# Patient Record
Sex: Female | Born: 1935 | Race: White | Hispanic: No | Marital: Married | Smoking: Former smoker
Health system: Southern US, Community
[De-identification: ages and names within clinical notes are randomized; demographics above are authoritative.]

## PROBLEM LIST (undated history)

## (undated) DIAGNOSIS — E785 Hyperlipidemia, unspecified: Secondary | ICD-10-CM

## (undated) DIAGNOSIS — E039 Hypothyroidism, unspecified: Secondary | ICD-10-CM

## (undated) DIAGNOSIS — K219 Gastro-esophageal reflux disease without esophagitis: Secondary | ICD-10-CM

## (undated) DIAGNOSIS — M1990 Unspecified osteoarthritis, unspecified site: Secondary | ICD-10-CM

## (undated) DIAGNOSIS — G4730 Sleep apnea, unspecified: Secondary | ICD-10-CM

## (undated) DIAGNOSIS — F32A Depression, unspecified: Secondary | ICD-10-CM

## (undated) DIAGNOSIS — F329 Major depressive disorder, single episode, unspecified: Secondary | ICD-10-CM

## (undated) DIAGNOSIS — I10 Essential (primary) hypertension: Secondary | ICD-10-CM

## (undated) DIAGNOSIS — R011 Cardiac murmur, unspecified: Secondary | ICD-10-CM

## (undated) DIAGNOSIS — Z8719 Personal history of other diseases of the digestive system: Secondary | ICD-10-CM

## (undated) HISTORY — DX: Hyperlipidemia, unspecified: E78.5

## (undated) HISTORY — PX: CARDIAC CATHETERIZATION: SHX172

## (undated) HISTORY — PX: CHOLECYSTECTOMY: SHX55

## (undated) HISTORY — DX: Sleep apnea, unspecified: G47.30

## (undated) HISTORY — PX: OTHER SURGICAL HISTORY: SHX169

## (undated) HISTORY — PX: ABDOMINAL HYSTERECTOMY: SHX81

## (undated) HISTORY — DX: Essential (primary) hypertension: I10

## (undated) HISTORY — PX: JOINT REPLACEMENT: SHX530

---

## 1997-12-20 ENCOUNTER — Ambulatory Visit (HOSPITAL_COMMUNITY): Admission: RE | Admit: 1997-12-20 | Discharge: 1997-12-20 | Payer: Self-pay | Admitting: *Deleted

## 1998-04-07 ENCOUNTER — Encounter: Admission: RE | Admit: 1998-04-07 | Discharge: 1998-07-06 | Payer: Self-pay | Admitting: Neurology

## 1998-07-20 ENCOUNTER — Ambulatory Visit (HOSPITAL_COMMUNITY): Admission: RE | Admit: 1998-07-20 | Discharge: 1998-07-20 | Payer: Self-pay | Admitting: Internal Medicine

## 1998-07-20 ENCOUNTER — Encounter: Payer: Self-pay | Admitting: Internal Medicine

## 1998-10-26 ENCOUNTER — Other Ambulatory Visit: Admission: RE | Admit: 1998-10-26 | Discharge: 1998-10-26 | Payer: Self-pay | Admitting: *Deleted

## 1999-02-28 ENCOUNTER — Ambulatory Visit (HOSPITAL_COMMUNITY): Admission: RE | Admit: 1999-02-28 | Discharge: 1999-02-28 | Payer: Self-pay | Admitting: *Deleted

## 1999-03-03 ENCOUNTER — Ambulatory Visit (HOSPITAL_COMMUNITY): Admission: RE | Admit: 1999-03-03 | Discharge: 1999-03-03 | Payer: Self-pay | Admitting: *Deleted

## 1999-05-31 ENCOUNTER — Encounter: Admission: RE | Admit: 1999-05-31 | Discharge: 1999-08-29 | Payer: Self-pay | Admitting: *Deleted

## 2000-01-11 ENCOUNTER — Other Ambulatory Visit: Admission: RE | Admit: 2000-01-11 | Discharge: 2000-01-11 | Payer: Self-pay | Admitting: *Deleted

## 2000-01-11 ENCOUNTER — Ambulatory Visit (HOSPITAL_COMMUNITY): Admission: RE | Admit: 2000-01-11 | Discharge: 2000-01-11 | Payer: Self-pay | Admitting: *Deleted

## 2001-06-04 ENCOUNTER — Encounter: Admission: RE | Admit: 2001-06-04 | Discharge: 2001-06-04 | Payer: Self-pay | Admitting: Internal Medicine

## 2001-06-04 ENCOUNTER — Encounter: Payer: Self-pay | Admitting: Internal Medicine

## 2001-10-10 ENCOUNTER — Encounter: Payer: Self-pay | Admitting: Internal Medicine

## 2001-10-10 ENCOUNTER — Ambulatory Visit (HOSPITAL_COMMUNITY): Admission: RE | Admit: 2001-10-10 | Discharge: 2001-10-10 | Payer: Self-pay | Admitting: Internal Medicine

## 2001-12-15 ENCOUNTER — Other Ambulatory Visit (HOSPITAL_COMMUNITY): Admission: RE | Admit: 2001-12-15 | Discharge: 2001-12-22 | Payer: Self-pay | Admitting: *Deleted

## 2002-02-19 ENCOUNTER — Encounter (INDEPENDENT_AMBULATORY_CARE_PROVIDER_SITE_OTHER): Payer: Self-pay | Admitting: *Deleted

## 2002-02-19 ENCOUNTER — Ambulatory Visit (HOSPITAL_COMMUNITY): Admission: RE | Admit: 2002-02-19 | Discharge: 2002-02-19 | Payer: Self-pay | Admitting: Gastroenterology

## 2003-06-15 ENCOUNTER — Encounter: Admission: RE | Admit: 2003-06-15 | Discharge: 2003-06-15 | Payer: Self-pay | Admitting: Internal Medicine

## 2003-06-16 ENCOUNTER — Encounter
Admission: RE | Admit: 2003-06-16 | Discharge: 2003-06-16 | Payer: Self-pay | Admitting: Physical Medicine and Rehabilitation

## 2004-03-21 ENCOUNTER — Ambulatory Visit (HOSPITAL_COMMUNITY): Admission: RE | Admit: 2004-03-21 | Discharge: 2004-03-21 | Payer: Self-pay | Admitting: Internal Medicine

## 2004-06-15 ENCOUNTER — Encounter: Admission: RE | Admit: 2004-06-15 | Discharge: 2004-06-15 | Payer: Self-pay | Admitting: Internal Medicine

## 2007-01-18 ENCOUNTER — Encounter: Admission: RE | Admit: 2007-01-18 | Discharge: 2007-01-18 | Payer: Self-pay | Admitting: Internal Medicine

## 2007-06-05 ENCOUNTER — Ambulatory Visit (HOSPITAL_COMMUNITY): Admission: RE | Admit: 2007-06-05 | Discharge: 2007-06-05 | Payer: Self-pay | Admitting: Internal Medicine

## 2007-07-23 ENCOUNTER — Ambulatory Visit (HOSPITAL_COMMUNITY): Admission: RE | Admit: 2007-07-23 | Discharge: 2007-07-23 | Payer: Self-pay | Admitting: Internal Medicine

## 2008-01-15 ENCOUNTER — Emergency Department (HOSPITAL_COMMUNITY): Admission: EM | Admit: 2008-01-15 | Discharge: 2008-01-15 | Payer: Self-pay | Admitting: Emergency Medicine

## 2008-01-27 ENCOUNTER — Ambulatory Visit (HOSPITAL_COMMUNITY): Admission: RE | Admit: 2008-01-27 | Discharge: 2008-01-27 | Payer: Self-pay | Admitting: Orthopedic Surgery

## 2008-03-26 ENCOUNTER — Inpatient Hospital Stay (HOSPITAL_COMMUNITY): Admission: RE | Admit: 2008-03-26 | Discharge: 2008-03-30 | Payer: Self-pay | Admitting: Orthopedic Surgery

## 2008-08-26 ENCOUNTER — Emergency Department (HOSPITAL_COMMUNITY): Admission: EM | Admit: 2008-08-26 | Discharge: 2008-08-26 | Payer: Self-pay | Admitting: Emergency Medicine

## 2009-02-10 ENCOUNTER — Inpatient Hospital Stay (HOSPITAL_COMMUNITY): Admission: RE | Admit: 2009-02-10 | Discharge: 2009-02-11 | Payer: Self-pay | Admitting: Orthopedic Surgery

## 2009-10-16 ENCOUNTER — Emergency Department (HOSPITAL_COMMUNITY)
Admission: EM | Admit: 2009-10-16 | Discharge: 2009-10-16 | Payer: Self-pay | Source: Home / Self Care | Admitting: Emergency Medicine

## 2010-03-07 ENCOUNTER — Encounter: Admission: RE | Admit: 2010-03-07 | Discharge: 2010-03-07 | Payer: Self-pay | Admitting: Internal Medicine

## 2010-08-27 ENCOUNTER — Encounter: Payer: Self-pay | Admitting: Orthopedic Surgery

## 2010-10-30 LAB — DIFFERENTIAL
Basophils Absolute: 0.1 10*3/uL (ref 0.0–0.1)
Basophils Relative: 1 % (ref 0–1)
Eosinophils Absolute: 0.2 10*3/uL (ref 0.0–0.7)
Eosinophils Relative: 3 % (ref 0–5)
Lymphocytes Relative: 36 % (ref 12–46)
Lymphs Abs: 2.5 10*3/uL (ref 0.7–4.0)
Monocytes Absolute: 0.4 10*3/uL (ref 0.1–1.0)
Monocytes Relative: 5 % (ref 3–12)
Neutro Abs: 3.8 10*3/uL (ref 1.7–7.7)
Neutrophils Relative %: 55 % (ref 43–77)

## 2010-10-30 LAB — BASIC METABOLIC PANEL
BUN: 16 mg/dL (ref 6–23)
CO2: 28 mEq/L (ref 19–32)
Calcium: 9.5 mg/dL (ref 8.4–10.5)
Chloride: 104 mEq/L (ref 96–112)
Creatinine, Ser: 0.85 mg/dL (ref 0.4–1.2)
GFR calc Af Amer: >60 mL/min (ref >60)
GFR calc non Af Amer: >60 mL/min (ref >60)
Glucose, Bld: 116 mg/dL — ABNORMAL HIGH (ref 70–99)
Potassium: 3.9 mEq/L (ref 3.5–5.1)
Sodium: 139 mEq/L (ref 135–145)

## 2010-10-30 LAB — SEDIMENTATION RATE: Sed Rate: 4 mm/hr (ref 0–22)

## 2010-10-30 LAB — CBC
HCT: 35.9 % — ABNORMAL LOW (ref 36.0–46.0)
Hemoglobin: 12.7 g/dL (ref 12.0–15.0)
MCHC: 35.3 g/dL (ref 30.0–36.0)
MCV: 95.7 fL (ref 78.0–100.0)
Platelets: 208 10*3/uL (ref 150–400)
RBC: 3.75 MIL/uL — ABNORMAL LOW (ref 3.87–5.11)
RDW: 13.8 % (ref 11.5–15.5)
WBC: 6.9 10*3/uL (ref 4.0–10.5)

## 2010-11-12 LAB — GLUCOSE, CAPILLARY
Glucose-Capillary: 105 mg/dL — ABNORMAL HIGH (ref 70–99)
Glucose-Capillary: 86 mg/dL (ref 70–99)

## 2010-11-12 LAB — BASIC METABOLIC PANEL
BUN: 18 mg/dL (ref 6–23)
CO2: 28 mEq/L (ref 19–32)
Calcium: 9.8 mg/dL (ref 8.4–10.5)
Chloride: 106 mEq/L (ref 96–112)
Creatinine, Ser: 0.68 mg/dL (ref 0.4–1.2)
GFR calc Af Amer: >60 mL/min (ref >60)
GFR calc non Af Amer: >60 mL/min (ref >60)
Glucose, Bld: 104 mg/dL — ABNORMAL HIGH (ref 70–99)
Potassium: 3.9 mEq/L (ref 3.5–5.1)
Sodium: 144 mEq/L (ref 135–145)

## 2010-11-12 LAB — HEMOGLOBIN AND HEMATOCRIT, BLOOD
HCT: 39 % (ref 36.0–46.0)
Hemoglobin: 13.4 g/dL (ref 12.0–15.0)

## 2010-12-19 NOTE — Discharge Summary (Signed)
[name], [name]             ACCOUNT NO.:  [id number]   MEDICAL RECORD NO.:  [id number]          PATIENT TYPE:  INP   LOCATION:  1613                         FACILITY:  [facility]   PHYSICIAN:  Marlowe Kays, M.D.  DATE OF BIRTH:  1936/01/28   DATE OF ADMISSION:  03/26/2008  DATE OF DISCHARGE:  03/30/2008                               DISCHARGE SUMMARY   ADMITTING DIAGNOSES:  1. Osteoarthritis of the left hip.  2. Healing fracture left foot.  3. Hypertension.  4. Reflux.  5. Hypothyroidism.  6. Recurrent urinary tract infections.  7. Chronic anemia.   DISCHARGE DIAGNOSIS:  1. Osteoarthritis of the left hip.  2. Healing fracture left foot.  3. Hypertension.  4. Reflux.  5. Hypothyroidism.  6. Recurrent urinary tract infections.  7. Chronic anemia.  8. Postoperative anemia treated with transfusion.   OPERATION:  On Dec 25, 2007 the patient underwent Osteonics total hip  replacement arthroplasty left hip, Dr. Durene Romans assisted.   BRIEF HISTORY:  This 75 year old white female with progressive problems  concerning her left hip now which has been going on for some time.  She  is found to be extremely uncomfortable having more and more difficulty  getting about particularly with coming to a standing position and using  stairs.  She has essentially chronic pain into the left hip.  Dr.  Simonne Come injected the hip under fluoro with anesthetic, and this  relieved the pain totally.  Our feeling is that indeed she did have some  pathology severe enough to go ahead with surgery.  X-rays also showed  degeneration of the joint.   COURSE IN THE HOSPITAL:  The patient tolerated the surgical procedure  quite well.  She began working immediately with physical therapy,  ambulating the hall, maintaining partial weightbearing on the operative  extremity.  She essentially was touchdown weightbearing.  She was able  to ambulate 25+ feet in the hallway and had compliance with her  touchdown weightbearing.  The wound remained clean and dry  postoperatively.  She did have some tape blisters from the Hypafix that  was given and was used on her postop dressing.  Other than that, she did  quite well.  She had a drop in her hemoglobin postoperatively that  required transfusion.  Hemoglobin dropped to 7.1, with hematocrit of  20.7.  She was transfused 2 units to bring her hemoglobin up to 11.5,  hematocrit 34.  Her hemoglobin at the time of discharge 9.3, hematocrit  27.2.  Once all arrangements were made, including home health, it was  felt she could be maintained in her home environment and arrangements  were made for discharge.  Laboratory values other than the above showed  blood chemistries to be normal.  Urinalysis was negative as well.  Her  preoperative hemoglobin was 11.9, hematocrit 33.5.  Cardiac clearance in  the chart, with electrocardiogram, et Karie Soda, essentially normal.   CONDITION ON DISCHARGE:  Improved and stable.   PLAN:  The patient discharged to her home.  Follow up with her medical  doctor as indicated.   DISCHARGE MEDICATIONS:  1. Norco  1 every 6 hours p.r.n.  2. Diltiazem XR 180 mg q. a.m.  3. Levothyroxine 80 mcg q. a.m.  4. Quinapril 40 mg q. a.m.  5. Effexor 75 mg 2 q. a.m.  6. Lexapro 10 mg q. a.m.  7. Crestor 10 mg q. a.m.  8. Prilosec 20 mg p.m.  9. Fish oil p.r.n.  10.We have written her a prescription for OxyIR 5 mg for pain.  11.Robaxin 500 mg for muscle spasms.  12.She will be on Coumadin protocol for 4 weeks after the date of      surgery and the pharmacist at [facility] will help dose that for [place].   Return to see Dr. Simonne Come in 2 weeks after the date of surgery.   CONDITION ON DISCHARGE:  Improved, stable.      Dooley L. Cherlynn June.    ______________________________  Marlowe Kays, M.D.    DLU/MEDQ  D:  03/30/2008  T:  03/30/2008  Job:  [id number]   cc:   Lovenia Kim, D.O.  Fax: [phone]   Marlowe Kays, M.D.  Fax: [phone]

## 2010-12-19 NOTE — H&P (Signed)
[name], [name]             ACCOUNT NO.:  [id number]   MEDICAL RECORD NO.:  [id number]          PATIENT TYPE:  OUT   LOCATION:  XRAY                         FACILITY:  [facility]   PHYSICIAN:  Marlowe Kays, M.D.  DATE OF BIRTH:  [birth year]   DATE OF ADMISSION:  03/26/2008  DATE OF DISCHARGE:                              HISTORY & PHYSICAL   CHIEF COMPLAINT:  Pain in my left hip.   HISTORY OF PRESENT ILLNESS:  This 75 year old white female has been seen  by [place] for continuing progressive problems concerning pain into her left  hip.  She has been having problems with this for some time.  She has  difficulty sleeping due to her left hip, and now has to walk with a  cane.  X-rays show near bone-on-bone deformity of left hip.  We tried an  intra-articular injection to the left hip under fluoro and local  anesthesia.  This completely relieved her hip pain for about 2 hours,  but it did return.  This was done primarily in lieu of the fact that she  has spinal stenosis at L4-5, being treated by Dr. Ethelene Hal with analgesics  and diagnostically indeed she is having more pain from the hip than the  spine at this time.   After much discussion including the risks and benefits of surgery, we  have decided to go ahead with total hip replacement arthroplasty.   PAST MEDICAL HISTORY:  The patient has been in relatively good health  throughout her lifetime.  Medically, she had been treated by Dr. Marisue Brooklyn for hypothyroidism, chronic urinary tract infection, reflux,  hypertension.  She also has diabetes, but is controlled by diet.   CURRENT MEDICATION:  1. DILT-XR 100 mg daily.  2. Levothyroxine 200 mEq daily.  3. Quinapril 40 mg daily.  4. Effexor 150 mg daily.  5. Lexapro 10 mg daily.  6. Crestor 10 mg daily.  7. Lovaza p.r.n.  8. Prilosec p.r.n.  9. Norco 10 on p.r.n.   ALLERGIES:  IVP DYE.   FAMILY PHYSICIAN:  Dr. Marisue Brooklyn.   PAST SURGERIES:  1. A hysterectomy in June  1995.  2. Benign breast tumors removed in 1975.  3. Cholecystectomy in 1995.   FAMILY HISTORY:  Positive for heart disease in the father and mother,  hypertension in the father and mother, diabetes in the mother as well as  breast cancer in the mother.   SOCIAL HISTORY:  The patient is married.  She is retired, works part-  time for [employer].  She has no intake of alcohol or tobacco  products.  She has two children, four stairs into her home and three  stairs out.   REVIEW OF SYSTEMS:  CNS:  No seizures or shoulder paralysis, numbness,  double vision.  RESPIRATORY:  No productive cough, no hemoptysis or shortness of breath.  CARDIOVASCULAR:  No chest pain or angina, orthopnea.  GASTROINTESTINAL:  No nausea, vomiting, melena, or bloody stool.  GENITOURINARY:  No discharge, dysuria, hematuria.  Her last urinary  tract infection was February this year.   PHYSICAL EXAMINATION:  GENERAL:  Alert and cooperative, friendly.  A 34-  year-old white female.  VITAL SIGNS:  Blood pressure 154/68, pulse 50, respirations 14.  HEENT:  Normocephalic, PERRLA.  EOM intact.  Oropharynx is clear.  CHEST:  Clear to auscultation, rhonchi or rales.  No wheezes.  HEART:  Regular rate and rhythm.  No murmurs are heard.  ABDOMEN:  Obese, soft, nontender, liver and spleen not felt.  GENITALIA/RECTAL/PELVIC/BREASTS:  Not done, not pertinent to present  illness.  EXTREMITIES:  The patient has pain with range of motion of the left hip  and some mild discomfort on the left lateral foot at a healing fracture  of the base of the fifth metatarsal.   ADMISSION DIAGNOSIS:  End-stage osteoarthritis of left hip.   PLAN:  1. Healing fracture of the left foot (base of the fifth metatarsal).  2. Hypertension.  3. Reflux.  4. Hypothyroidism.  5. Recurrent UTIs.  6. Chronic anemia.   PLAN:  The patient will undergo left total hip replacement arthroplasty.  Dr. Charlann Boxer will assist.  Plans at this point in time is  for her to have  home health after her regular hospitalization.  She may need skilled  nursing depending on her activity and hospital.      Terie Purser L. Cherlynn June.    ______________________________  Marlowe Kays, M.D.   DLU/MEDQ  D:  03/17/2008  T:  03/17/2008  Job:  [id number]   cc:   Lovenia Kim, D.O.  Fax: [phone]   Marlowe Kays, M.D.  Fax: [phone]

## 2010-12-19 NOTE — Op Note (Signed)
[name], [name]             ACCOUNT NO.:  [id number]   MEDICAL RECORD NO.:  [id number]          PATIENT TYPE:  INP   LOCATION:  0003                         FACILITY:  [facility]   PHYSICIAN:  [name], M.D.  DATE OF BIRTH:  1936/04/17   DATE OF PROCEDURE:  03/26/2008  DATE OF DISCHARGE:                               OPERATIVE REPORT   PREOPERATIVE DIAGNOSIS:  Osteoarthritis, left hip.   POSTOPERATIVE DIAGNOSIS:  Osteoarthritis, left hip.   OPERATION:  Osteonics total hip replacement, left.   SURGEON:  [name], M.D.   ASSISTANT:  [name]. [name], M.D.   ANESTHESIA:  Spinal.   INDICATIONS FOR PROCEDURE:  She has osteoarthritis of her left hip, also  has a bad back.  I have injected her left hip under fluoro and this  relieved her pain indicating the reliability of performing a total hip  replacement on the left.   PROCEDURE:  Prophylactic antibiotics, satisfactory spinal anesthesia,  Foley catheter inserted right, lateral decubitus position on the [facility] II  frame.  The left hip was prepped with DuraPrep and draped in a sterile  field.  I-beam employed.  Time-out performed.  Posterolateral incision  down to the fascia lata.  External rotators were detached from the  femur, the capsule identified and subtotal capsulectomy performed. I  was then able to dislocate the hip and with a power saw amputated the  femoral neck just below the femoral head.  The piriformis fossa was  cleared of soft tissue and the guide pin was placed down into the femur  and overdrilled with the canal finder placed.  During this procedure,  she had a little fragmentation of the posterior femoral neck cortex  which we did not feel would contribute to any lack of stability or  fixation.  We then began the vertical reaming of the femur using the  greater trochanter reamer to increase the lateral penetration and reamed  up to a #7, rasping up to the same.  Along the way, with the vertical  ramus,  I made my final femoral neck cut a fingerbreadth above the  lesser trochanter.  I felt that the #7 prosthesis gave [place] good tight  stabilization fit.  I then reamed for the tip of the prosthesis up to  11.5.  I then completed the capsulectomy of the acetabulum and removed  the soft tissue from the fossa ovale. With a 45 mm starting reamer, I  flattened out the acetabular base down to bleeding bone and then gently  expanded up to a 50.  I then went through a trial reduction with the cup  having good stability and the tip was nice and stable.  Accordingly,  we  went ahead and, after irrigating the wound well, I placed the final  acetabular shell of 50 mm which was stabilized with 2 screws, which were  individually screwed, measured and screwed. I then placed a final 36 mm  insert with a 10 mm offset at about 1 o'clock.  I went ahead and placed  the final prosthesis reducing the hip with a +5 trial neck and this  seemed to be an ideal fit with neck length tension and stability.  Accordingly, I went ahead and  placed a final 36 mL head plus 5 neck  reducing the hip and once again found it to be stable.  I irrigated the  wound well and closed it with interrupted #1-Vicryl in the external  rotator muscles, #1-Vicryl in the fascia lata and split gluteus muscle,  #1-Vicryl in the deep subcutaneous tissue, 2-0 Vicryl in the superficial  subcutaneous tissue and staples in the skin.  Betadine, Adaptic and dry  sterile dressing were applied.  She was then gently placed on her back  protecting the leg.  The leg lengths were equal and she was already  starting to  move and dorsiflex her foot in the operating room.  I then  placed her on an abduction pillow.  Then she was taken to the recovery  room in satisfactory condition with no known complications.  Estimated  blood loss was 600 mL, no blood replacement.           ______________________________  [name], M.D.     JA/MEDQ  D:   03/26/2008  T:  03/26/2008  Job:  [id number]

## 2010-12-19 NOTE — Op Note (Signed)
[name], [name]             ACCOUNT NO.:  [id number]   MEDICAL RECORD NO.:  [id number]          PATIENT TYPE:  OIB   LOCATION:  1618                         FACILITY:  [facility]   PHYSICIAN:  Marlowe Kays, M.D.  DATE OF BIRTH:  20-Aug-1935   DATE OF PROCEDURE:  DATE OF DISCHARGE:                               OPERATIVE REPORT   PREOPERATIVE DIAGNOSES:  1. Glenohumeral arthritis with labral degenerative tear.  2. Extensive rotator cuff tear.   POSTOPERATIVE DIAGNOSES:  1. Glenohumeral arthritis with labral degenerative tear.  2. Extensive rotator cuff tear.  3. Partial tear of long head biceps tendon.   OPERATION:  1. Right shoulder arthroscopy with debridement of labrum, synovectomy      of joint and also debridement of long head biceps tendon.  2. Open anterior acromionectomy.      a.     Repair of long head biceps tendon.      b.     Repair of extensive rotator cuff tear.   SURGEON:  Marlowe Kays, M.D.   ASSISTANT:  Mr. [name], [facility].   ANESTHESIA:  General.   PATHOLOGY AND JUSTIFICATION FOR PROCEDURE:  Painful right shoulder with  an MRI on November 17, 2008 demonstrating the preoperative diagnoses.  There is felt to be some mild long head of the biceps tendinosis.  See  operative description below for additional details of the surgery.   PROCEDURE:  Prophylactic antibiotics, beach-chair position on the Allen  frame, right shoulder girdle was prepped with DuraPrep and draped in a  sterile field.  A timeout performed.  The anatomy of the shoulder joint  was marked out and the coracoid process posterior portal and eventual  open incision for the rotator cuff repair were all marked out.  She also  had a preoperative interscalene block.  Through a posterior soft spot  portal, I atraumatically entered the glenohumeral joint and on  inspection found the labrum to be quite disrupted.  There was also good  bit of reactive tissue throughout the joint and the biceps tendon  was  only barely visible.  I advanced the scope between what I thought was  the biceps tendon and the subscapularis and using a switching stick made  an anterior incision.  Over the switching stick, I placed another  cannula and introduced a 4.2 shaver into the joint.  I then began  debriding down her labrum, generally debriding the anterior of the joint  and a good bit of synovitis and reactive tissue around the long head of  the biceps tendon which was noted superiorly to be quite flattened and  it did appear that there were some fissures in it.  Basically it was  intact, however.  After completing this debridement with pictures, I  then made an incision vertically from roughly the [facility] joint down to the  greater tuberosity.  The fascia over the anterior acromion was opened in  line with the skin incision with cutting cautery and the anterior  acromion identified along with the [facility] joint with a Mellody Dance needle.  She  had a very prominent anterior acromion and I  undermined it with a small  Cobb elevator followed by a larger Cobb elevator.  I then used the micro-  saw to perform my initial anterior acromionectomy preserving the [facility]  joint.  I then trimmed a little bit more underneath surface bone off the  acromion which gave [place] a nice exposure without any residual impingement.  The long head of the biceps tendon was identifiable and a big gap  created by anterior-posterior flaps of the rotator cuff with the tear  going all way back beneath the clavicle.  The biceps tendon had a number  of longitudinal fissures in it, but overall was intact.  I was able to  repair it with multiple interrupted 2-0 Vicryl sutures, which I used to  try and minimize having any nonabsorbable suture in her shoulder joint.  I then freed up the two halves of the rotator cuff.  Working laterally  first, I then was able to place multiple #1 Ethibond sutures side-to-  side getting what appeared to be a good stable repair  working well back  up underneath the acromion and clavicle until the entire tear had been  repaired side-to-side.  The repair was stable with her arm to her side.  After irrigating the wound well with sterile saline, I then repaired a  small separation of the deltoid muscle and fascia over the residual  anterior acromion with interrupted #1 Vicryl, subcutaneous tissue was  closed with 2-0 Vicryl and the skin with Steri-Strips.  Previous to the  open incision, I closed the two portals from the arthroscopy with 4-0  nylon and also applied a Betadine sticky drape prior to the arthrotomy.  Betadine and Adaptic were placed over the two portals.  A dry sterile  dressing over the entire shoulder.  She was placed in a shoulder  immobilizer and taken to the recovery room in satisfactory condition  with no known complications.           ______________________________  Marlowe Kays, M.D.     JA/MEDQ  D:  02/09/2009  T:  02/10/2009  Job:  [id number]

## 2010-12-22 NOTE — Procedures (Signed)
[facility]  Patient:    [name], [name] Visit Number: [id number] MRN: [id number]          Service Type: END Location: ENDO Attending Physician:  Orland Mustard Dictated by:   Llana Aliment. Randa Evens, M.D. Proc. Date: 02/19/02 Admit Date:  02/19/2002 Discharge Date: 02/19/2002   CC:         Lovenia Kim, D.O.   Procedure Report  DATE OF BIRTH:  [birth year].  PROCEDURE:  Colonoscopy.  MEDICATIONS:  Fentanyl 100 mcg, Versed 15 mg IV.  SCOPE:  Olympus pediatric video colonoscope.  INDICATION:  Patient with a need for colon cancer screening.  DESCRIPTION OF PROCEDURE:  The procedure had been explained to the patient and consent obtained.  With the patient in the left lateral decubitus position, the Olympus pediatric video colonoscope was inserted, advanced under direct visualization.  The prep was excellent.  Using abdominal pressure and position changes, we were able to reach the cecum.  The ileocecal valve and appendiceal orifice were seen.  The scope was withdrawn.  In the ascending colon just above the ileocecal valve, a 0.5 cm sessile polyp removed with a snare and sucked through the scope.  No other polyps seen in the transverse or descending colon.  At approximately 30 from the anal verge in the sigmoid colon a 3-4 mm sessile polyp was removed with the snare and sucked with the scope.  Both polyps were recovered and placed in separate jars.  The scope was withdrawn.  The patient tolerated the procedure well.  ASSESSMENT:  Polyps removed from the sigmoid and ascending colon.  PLAN:  Will check pathology.  Will probably need a three-year repeat.  Routine postpolypectomy instructions. Dictated by:   Llana Aliment. Randa Evens, M.D. Attending Physician:  Orland Mustard DD:  02/19/02 TD:  02/24/02 Job: [id number]

## 2011-06-19 ENCOUNTER — Telehealth: Payer: Self-pay

## 2011-06-19 NOTE — Telephone Encounter (Addendum)
ROI faxed to [facility] @ [phone]   06/19/11/km  Records Received from [facility] gave to [facility] 06/25/11/km

## 2011-07-25 ENCOUNTER — Encounter: Payer: Self-pay | Admitting: Internal Medicine

## 2011-07-26 ENCOUNTER — Ambulatory Visit: Payer: Self-pay | Admitting: Internal Medicine

## 2011-08-17 ENCOUNTER — Encounter: Payer: Self-pay | Admitting: Internal Medicine

## 2011-08-17 ENCOUNTER — Ambulatory Visit (INDEPENDENT_AMBULATORY_CARE_PROVIDER_SITE_OTHER): Payer: Medicare Other | Admitting: Internal Medicine

## 2011-08-17 VITALS — BP 170/69 | HR 56 | Ht 63.0 in | Wt 214.0 lb

## 2011-08-17 DIAGNOSIS — I10 Essential (primary) hypertension: Secondary | ICD-10-CM

## 2011-08-17 DIAGNOSIS — E785 Hyperlipidemia, unspecified: Secondary | ICD-10-CM

## 2011-08-17 DIAGNOSIS — I2510 Atherosclerotic heart disease of native coronary artery without angina pectoris: Secondary | ICD-10-CM | POA: Diagnosis not present

## 2011-08-17 MED ORDER — BUPROPION HCL ER (SR) 150 MG PO TB12: 150.0000 mg | ORAL_TABLET | Freq: Two times a day (BID) | ORAL | Status: DC

## 2011-08-17 MED ORDER — CITALOPRAM HYDROBROMIDE 40 MG PO TABS: 40.0000 mg | ORAL_TABLET | Freq: Every day | ORAL | Status: DC

## 2011-08-17 NOTE — Progress Notes (Signed)
HPI  Patient is a 76 year old with a history of CAD.  Previously followed at [facility] by Dr. Lynnea Ferrier. She has a history of mild to moderate CAD (Cath 2009 after abnormmal perfusion test.  50% mid/distal LAD; 50% L Cx), 60% R renal artery stenosis, OSA (not using), DM, HTN, Dyslpidemia.  She is also seen by Dr. Oneta Rack. I take care of her husband Laurena Valko. The patient is under marked increased stress caring for her husband who is very inactive.  Tearful today talking about it.  Denies signif CP or signif SOB> Allergies  Allergen Reactions  . Iohexol      Desc: THROAT SWELLED/ PROBLEMS BREATHING WITH IVP DYE 10 PLUS YEARS AGO     Current Outpatient Prescriptions  Medication Sig Dispense Refill  . ABILIFY 2 MG tablet 1 tab daily      . acyclovir (ZOVIRAX) 800 MG tablet As directed      . ALPRAZolam (XANAX) 0.25 MG tablet As needed      . buPROPion (WELLBUTRIN SR) 150 MG 12 hr tablet 1 tab daily      . citalopram (CELEXA) 40 MG tablet 1 tab daily      . diltiazem (CARDIZEM CD) 180 MG 24 hr capsule 1 tab daily      . HYDROcodone-acetaminophen (NORCO) 10-325 MG per tablet As needed      . levothyroxine (SYNTHROID, LEVOTHROID) 200 MCG tablet Take 200 mcg by mouth daily.      . modafinil (PROVIGIL) 200 MG tablet 1 tab daily      . omeprazole (PRILOSEC) 20 MG capsule Take 20 mg by mouth daily.      . rosuvastatin (CRESTOR) 10 MG tablet Take 10 mg by mouth daily.        Past Medical History  Diagnosis Date  . Sleep apnea   . Diabetes mellitus     diet controlled  . Hypertension   . Dyslipidemia     Past Surgical History  Procedure Date  . Breast tumor     secondary to fibrocystic disese  . Cholecystctomy   . Left hip replacement     No family history on file.  History   Social History  . Marital Status: Married    Spouse Name: N/A    Number of Children: N/A  . Years of Education: N/A   Occupational History  . Not on file.   Social History  Main Topics  . Smoking status: Former Smoker    Quit date: 08/06/1990  . Smokeless tobacco: Not on file  . Alcohol Use: Not on file  . Drug Use: Not on file  . Sexually Active: Not on file   Other Topics Concern  . Not on file   Social History Narrative   Married2 childrenRetired from [employer] , no longer works. She does not use alcohol or tobacco    Review of Systems:  All systems reviewed.  They are negative to the above problem except as previously stated.  Vital Signs: BP 170/69  Pulse 56  Ht 5\' 3"  (1.6 m)  Wt 214 lb (97.07 kg)  BMI 37.91 kg/m2  Physical Exam  Patient is in NAD.  Did not take meds this AM>  HEENT:  Normocephalic, atraumatic. EOMI, PERRLA.  Neck: JVP is normal. No thyromegaly. No bruits.  Lungs: clear to auscultation. No rales no wheezes.  Heart: Regular rate and rhythm. Normal S1, S2. No S3.   No significant murmurs. PMI not displaced.  Abdomen:  Supple, nontender. Normal bowel sounds. No masses. No hepatomegaly.  Extremities:   Good distal pulses throughout. No lower extremity edema.  Musculoskeletal :moving all extremities.  Neuro:   alert and oriented x3.  CN II-XII grossly intact.  EKG:  Sinus bradycardia  56 bpm.  Assessment and Plan:

## 2011-08-21 NOTE — Assessment & Plan Note (Signed)
I am not convinced of active angina.  She is under increased stress and I recomm that she try to get help so that she gets a break to do activities for herself. Stay active.

## 2011-08-21 NOTE — Assessment & Plan Note (Signed)
BP is high.  She did not take meds.  I have instructed her to follow.  Will get outside records.

## 2011-08-21 NOTE — Assessment & Plan Note (Signed)
Will get lipids from Dr. Michaelle Birks office.  Keep on meds.

## 2011-09-19 DIAGNOSIS — E039 Hypothyroidism, unspecified: Secondary | ICD-10-CM | POA: Diagnosis not present

## 2011-09-19 DIAGNOSIS — E559 Vitamin D deficiency, unspecified: Secondary | ICD-10-CM | POA: Diagnosis not present

## 2011-09-19 DIAGNOSIS — I10 Essential (primary) hypertension: Secondary | ICD-10-CM | POA: Diagnosis not present

## 2011-09-19 DIAGNOSIS — N390 Urinary tract infection, site not specified: Secondary | ICD-10-CM | POA: Diagnosis not present

## 2011-09-19 DIAGNOSIS — R7309 Other abnormal glucose: Secondary | ICD-10-CM | POA: Diagnosis not present

## 2011-09-19 DIAGNOSIS — E782 Mixed hyperlipidemia: Secondary | ICD-10-CM | POA: Diagnosis not present

## 2011-09-19 DIAGNOSIS — D649 Anemia, unspecified: Secondary | ICD-10-CM | POA: Diagnosis not present

## 2011-09-19 DIAGNOSIS — E538 Deficiency of other specified B group vitamins: Secondary | ICD-10-CM | POA: Diagnosis not present

## 2011-09-21 ENCOUNTER — Telehealth: Payer: Self-pay | Admitting: Internal Medicine

## 2011-09-21 NOTE — Telephone Encounter (Signed)
LOV,12,Labs faxed to [facility] @ [phone] 09/21/11/KM

## 2011-10-05 ENCOUNTER — Other Ambulatory Visit: Payer: Self-pay

## 2011-10-05 ENCOUNTER — Emergency Department (HOSPITAL_COMMUNITY)
Admission: EM | Admit: 2011-10-05 | Discharge: 2011-10-05 | Disposition: A | Discharge status: Home / Self Care | Payer: Medicare Other | Attending: Emergency Medicine | Admitting: Emergency Medicine

## 2011-10-05 ENCOUNTER — Encounter (HOSPITAL_COMMUNITY): Payer: Self-pay | Admitting: Emergency Medicine

## 2011-10-05 DIAGNOSIS — E785 Hyperlipidemia, unspecified: Secondary | ICD-10-CM | POA: Diagnosis not present

## 2011-10-05 DIAGNOSIS — M25519 Pain in unspecified shoulder: Secondary | ICD-10-CM | POA: Diagnosis not present

## 2011-10-05 DIAGNOSIS — E119 Type 2 diabetes mellitus without complications: Secondary | ICD-10-CM | POA: Insufficient documentation

## 2011-10-05 DIAGNOSIS — Z79899 Other long term (current) drug therapy: Secondary | ICD-10-CM | POA: Diagnosis not present

## 2011-10-05 DIAGNOSIS — I10 Essential (primary) hypertension: Secondary | ICD-10-CM | POA: Insufficient documentation

## 2011-10-05 DIAGNOSIS — R1110 Vomiting, unspecified: Secondary | ICD-10-CM | POA: Insufficient documentation

## 2011-10-05 MED ORDER — HYDROMORPHONE HCL PF 2 MG/ML IJ SOLN
2.0000 mg | Freq: Once | INTRAMUSCULAR | Status: AC
  Administered 2011-10-05: 2 mg via INTRAMUSCULAR
  Filled 2011-10-05: qty 1

## 2011-10-05 MED ORDER — DIPHENHYDRAMINE HCL 25 MG PO CAPS
ORAL_CAPSULE | ORAL | Status: AC
  Filled 2011-10-05: qty 1

## 2011-10-05 MED ORDER — HYDROMORPHONE HCL 2 MG PO TABS: 2.0000 mg | ORAL_TABLET | ORAL | Status: AC | PRN

## 2011-10-05 MED ORDER — ONDANSETRON 4 MG PO TBDP: 4.0000 mg | ORAL_TABLET | Freq: Four times a day (QID) | ORAL | Status: AC | PRN

## 2011-10-05 NOTE — ED Provider Notes (Signed)
History     CSN: [id number]  Arrival date & time 10/05/11  1722   First MD Initiated Contact with Patient 10/05/11 1820      Chief Complaint  Patient presents with  . Arm Pain    (Consider location/radiation/quality/duration/timing/severity/associated sxs/prior treatment) HPI Comments: Patient with a history of arthritis presents emergency Department with chief complaint of left shoulder pain.  Patient is currently being followed by [facility] orthopedics with Dr. Rennis Chris, for this shoulder.  Patient has an apt Wed March 6th.  Patient's shoulder had been treated with Cortisone injection late Jan that helped x 3 weeks. MRI done about 2 weeks ago and pt has not yet got results. MRI is not in our system. Pt was given norco for pain medication and was told by orthopedic doctor to double dose, Which caused the pt to vomit. Pt was then called in 2mg  dilaudid for pain on the 19th which she has been taking regularly and it is not relieving pain.   Patient is a 76 y.o. female presenting with shoulder pain. The history is provided by the patient.  Shoulder Pain This is a new problem. The current episode started 1 to 4 weeks ago. The problem occurs constantly. The problem has been gradually worsening. Associated symptoms include arthralgias. Pertinent negatives include no abdominal pain, anorexia, change in bowel habit, chest pain, chills, congestion, coughing, diaphoresis, fatigue, fever, headaches, joint swelling, myalgias, nausea, neck pain, numbness, rash, sore throat, swollen glands, urinary symptoms, vertigo, visual change, vomiting or weakness. Exacerbated by: movement, laying on it , pressure. She has tried oral narcotics for the symptoms. The treatment provided mild relief.    Past Medical History  Diagnosis Date  . Sleep apnea   . Diabetes mellitus     diet controlled  . Hypertension   . Dyslipidemia     Past Surgical History  Procedure Date  . Breast tumor     secondary to fibrocystic  disese  . Cholecystctomy   . Left hip replacement     No family history on file.  History  Substance Use Topics  . Smoking status: Former Smoker    Quit date: 08/06/1990  . Smokeless tobacco: Not on file  . Alcohol Use: No    OB History    Grav Para Term Preterm Abortions TAB SAB Ect Mult Living                  Review of Systems  Constitutional: Negative for fever, chills, diaphoresis and fatigue.  HENT: Negative for congestion, sore throat and neck pain.   Respiratory: Negative for cough.   Cardiovascular: Negative for chest pain.  Gastrointestinal: Negative for nausea, vomiting, abdominal pain, anorexia and change in bowel habit.  Musculoskeletal: Positive for arthralgias. Negative for myalgias and joint swelling.  Skin: Negative for rash.  Neurological: Negative for vertigo, weakness, numbness and headaches.  All other systems reviewed and are negative.    Allergies  Iohexol  Home Medications   Current Outpatient Rx  Name Route Sig Dispense Refill  . ABILIFY 2 MG PO TABS  1 tab daily    . ACYCLOVIR 800 MG PO TABS  As directed    . ALPRAZOLAM 0.25 MG PO TABS  As needed    . BUPROPION HCL ER (SR) 150 MG PO TB12 Oral Take 1 tablet (150 mg total) by mouth 2 (two) times daily. 1 tab daily 180 tablet 0  . CITALOPRAM HYDROBROMIDE 40 MG PO TABS Oral Take 1 tablet (40 mg total)  by mouth daily. 1 tab daily 90 tablet 0  . DILTIAZEM HCL ER COATED BEADS 180 MG PO CP24  1 tab daily    . HYDROCODONE-ACETAMINOPHEN 10-325 MG PO TABS  As needed    . LEVOTHYROXINE SODIUM 200 MCG PO TABS Oral Take 200 mcg by mouth daily.    Marland Kitchen MODAFINIL 200 MG PO TABS  1 tab daily    . OMEPRAZOLE 20 MG PO CPDR Oral Take 20 mg by mouth daily.    Marland Kitchen ROSUVASTATIN CALCIUM 10 MG PO TABS Oral Take 10 mg by mouth daily.      BP 195/82  Pulse 66  Temp(Src) 98.9 F (37.2 C) (Oral)  Resp 20  Ht 5\' 3"  (1.6 m)  Wt 200 lb (90.719 kg)  BMI 35.43 kg/m2  SpO2 98%  Physical Exam  Nursing note and vitals  reviewed. Constitutional: She is oriented to person, place, and time. She appears well-developed and well-nourished. No distress.  HENT:  Head: Normocephalic and atraumatic.  Eyes: Conjunctivae and EOM are normal.  Neck: Normal range of motion.  Cardiovascular: Normal rate, regular rhythm and intact distal pulses.   Pulmonary/Chest: Effort normal.  Musculoskeletal:       Left shoulder: She exhibits decreased range of motion and tenderness. She exhibits no effusion and no crepitus.       Patient has pain with range of motion and is tender to palpation of left shoulder. No crepitus. Scars from previous shoulder surgery on right side.   Neurological: She is alert and oriented to person, place, and time.  Skin: Skin is warm and dry. No rash noted. She is not diaphoretic.  Psychiatric: She has a normal mood and affect. Her behavior is normal.    ED Course  Procedures (including critical care time)  Labs Reviewed - No data to display No results found.   No diagnosis found.    MDM  Left shoulder pain, pain management  Patient's shoulder is currently being evaluated by [facility] orthopedics.  She has a followup appointment for MRI results on March 6.  Patient is here for pain management because pain has not been relieved by 2 mg oral Dilaudid.  Patient was treated in the emergency department with IM Dilaudid.  Patient's pain has been temporarily managed.  She is been advised to keep her followup appointment with her orthopedic specialist.        Jaci Carrel, PA-C 10/05/11 2017

## 2011-10-05 NOTE — ED Notes (Signed)
Pt. With left arm pain, past surgery on left shoulder and injections of cortisone.  Pt. Had MRI recently but does not know results.  Also has had nausea and vomiting from pain medication.

## 2011-10-05 NOTE — ED Provider Notes (Signed)
Medical screening examination/treatment/procedure(s) were performed by non-physician practitioner and as supervising physician I was immediately available for consultation/collaboration.   Briany Aye, MD 10/05/11 2151 

## 2011-10-05 NOTE — ED Notes (Signed)
Pt reports Dr. Rennis Chris is treating her for her pain. States she has received an MRI, but has not been able to get an appointment with them.  States pain is getting worse. Denies N/V, sob. Denies a known injury.

## 2011-10-11 DIAGNOSIS — R7309 Other abnormal glucose: Secondary | ICD-10-CM | POA: Diagnosis not present

## 2011-10-11 DIAGNOSIS — E782 Mixed hyperlipidemia: Secondary | ICD-10-CM | POA: Diagnosis not present

## 2011-10-11 DIAGNOSIS — I10 Essential (primary) hypertension: Secondary | ICD-10-CM | POA: Diagnosis not present

## 2011-10-18 DIAGNOSIS — Z1231 Encounter for screening mammogram for malignant neoplasm of breast: Secondary | ICD-10-CM | POA: Diagnosis not present

## 2011-10-18 DIAGNOSIS — Z8262 Family history of osteoporosis: Secondary | ICD-10-CM | POA: Diagnosis not present

## 2011-10-18 LAB — HM MAMMOGRAPHY: HM Mammogram: NORMAL

## 2011-10-18 LAB — HM DEXA SCAN

## 2011-10-24 DIAGNOSIS — E039 Hypothyroidism, unspecified: Secondary | ICD-10-CM | POA: Diagnosis not present

## 2011-11-02 ENCOUNTER — Encounter (HOSPITAL_COMMUNITY): Payer: Self-pay | Admitting: Pharmacy Technician

## 2011-11-08 ENCOUNTER — Encounter (HOSPITAL_COMMUNITY)
Admission: RE | Admit: 2011-11-08 | Discharge: 2011-11-08 | Disposition: A | Discharge status: Home / Self Care | Payer: Medicare Other | Source: Ambulatory Visit | Attending: Anesthesiology | Admitting: Anesthesiology

## 2011-11-08 ENCOUNTER — Encounter (HOSPITAL_COMMUNITY): Payer: Self-pay

## 2011-11-08 ENCOUNTER — Encounter (HOSPITAL_COMMUNITY)
Admission: RE | Admit: 2011-11-08 | Discharge: 2011-11-08 | Disposition: A | Discharge status: Home / Self Care | Payer: Medicare Other | Source: Ambulatory Visit | Attending: Orthopedic Surgery | Admitting: Orthopedic Surgery

## 2011-11-08 DIAGNOSIS — Z01811 Encounter for preprocedural respiratory examination: Secondary | ICD-10-CM | POA: Diagnosis not present

## 2011-11-08 DIAGNOSIS — J984 Other disorders of lung: Secondary | ICD-10-CM | POA: Diagnosis not present

## 2011-11-08 HISTORY — DX: Hypothyroidism, unspecified: E03.9

## 2011-11-08 HISTORY — DX: Gastro-esophageal reflux disease without esophagitis: K21.9

## 2011-11-08 HISTORY — DX: Depression, unspecified: F32.A

## 2011-11-08 HISTORY — DX: Cardiac murmur, unspecified: R01.1

## 2011-11-08 HISTORY — DX: Major depressive disorder, single episode, unspecified: F32.9

## 2011-11-08 HISTORY — DX: Personal history of other diseases of the digestive system: Z87.19

## 2011-11-08 HISTORY — DX: Unspecified osteoarthritis, unspecified site: M19.90

## 2011-11-08 LAB — BASIC METABOLIC PANEL
CO2: 28 mEq/L (ref 19–32)
Chloride: 102 mEq/L (ref 96–112)
Creatinine, Ser: 0.58 mg/dL (ref 0.50–1.10)
GFR calc non Af Amer: 89 mL/min — ABNORMAL LOW (ref >90)
Potassium: 4.4 mEq/L (ref 3.5–5.1)
Sodium: 140 mEq/L (ref 135–145)

## 2011-11-08 LAB — CBC
MCH: 32.1 pg (ref 26.0–34.0)
MCHC: 34.7 g/dL (ref 30.0–36.0)
Platelets: 243 10*3/uL (ref 150–400)
RDW: 13.2 % (ref 11.5–15.5)

## 2011-11-08 LAB — SURGICAL PCR SCREEN: Staphylococcus aureus: NEGATIVE

## 2011-11-08 LAB — TYPE AND SCREEN: ABO/RH(D): A POS

## 2011-11-08 MED ORDER — CHLORHEXIDINE GLUCONATE 4 % EX LIQD: 60.0000 mL | Freq: Once | CUTANEOUS | Status: DC

## 2011-11-08 NOTE — Pre-Procedure Instructions (Signed)
20 [name]  11/08/2011   Your procedure is scheduled on:  11/15/11  Report to [facility] at           AM.  Call this number if you have problems the morning of surgery: [phone]   Remember:   Do not eat food:After Midnight.  May have clear liquids: up to 4 Hours before arrival.  Clear liquids include soda, tea, black coffee, apple or grape juice, broth.  Take these medicines the morning of surgery with A SIP OF WATER:   Amlodipine  cardizem   Hydrocodone  Synthroid        Do not wear jewelry, make-up or nail polish.  Do not wear lotions, powders, or perfumes. You may wear deodorant.  Do not shave 48 hours prior to surgery.  Do not bring valuables to the hospital.  Contacts, dentures or bridgework may not be worn into surgery.  Leave suitcase in the car. After surgery it may be brought to your room.  For patients admitted to the hospital, checkout time is 11:00 AM the day of discharge.   Patients discharged the day of surgery will not be allowed to drive home.  Name and phone number of your driver: Lajuana Ripple-  [phone]  neighbor  Special Instructions: CHG Shower Use Special Wash: 1/2 bottle night before surgery and 1/2 bottle morning of surgery.   Please read over the following fact sheets that you were given: Pain Booklet, Coughing and Deep Breathing, Blood Transfusion Information, MRSA Information and Surgical Site Infection Prevention

## 2011-11-14 MED ORDER — CEFAZOLIN SODIUM-DEXTROSE 2-3 GM-% IV SOLR
2.0000 g | INTRAVENOUS | Status: AC
  Administered 2011-11-15: 2 g via INTRAVENOUS
  Filled 2011-11-14: qty 50

## 2011-11-15 ENCOUNTER — Ambulatory Visit (HOSPITAL_COMMUNITY): Payer: Medicare Other | Admitting: Certified Registered"

## 2011-11-15 ENCOUNTER — Encounter (HOSPITAL_COMMUNITY)
Admission: RE | Disposition: A | Discharge status: Home / Self Care | Payer: Self-pay | Source: Ambulatory Visit | Attending: Orthopedic Surgery

## 2011-11-15 ENCOUNTER — Encounter (HOSPITAL_COMMUNITY): Payer: Self-pay | Admitting: *Deleted

## 2011-11-15 ENCOUNTER — Encounter (HOSPITAL_COMMUNITY): Payer: Self-pay | Admitting: Certified Registered"

## 2011-11-15 ENCOUNTER — Inpatient Hospital Stay (HOSPITAL_COMMUNITY)
Admission: RE | Admit: 2011-11-15 | Discharge: 2011-11-19 | DRG: 484 | Disposition: A | Discharge status: Home / Self Care | Payer: Medicare Other | Source: Ambulatory Visit | Attending: Orthopedic Surgery | Admitting: Orthopedic Surgery

## 2011-11-15 DIAGNOSIS — E039 Hypothyroidism, unspecified: Secondary | ICD-10-CM | POA: Diagnosis not present

## 2011-11-15 DIAGNOSIS — Z01812 Encounter for preprocedural laboratory examination: Secondary | ICD-10-CM | POA: Diagnosis not present

## 2011-11-15 DIAGNOSIS — Z96649 Presence of unspecified artificial hip joint: Secondary | ICD-10-CM

## 2011-11-15 DIAGNOSIS — E119 Type 2 diabetes mellitus without complications: Secondary | ICD-10-CM | POA: Diagnosis present

## 2011-11-15 DIAGNOSIS — G8918 Other acute postprocedural pain: Secondary | ICD-10-CM | POA: Diagnosis not present

## 2011-11-15 DIAGNOSIS — M19019 Primary osteoarthritis, unspecified shoulder: Secondary | ICD-10-CM | POA: Diagnosis not present

## 2011-11-15 DIAGNOSIS — E785 Hyperlipidemia, unspecified: Secondary | ICD-10-CM | POA: Diagnosis not present

## 2011-11-15 HISTORY — PX: TOTAL SHOULDER ARTHROPLASTY: SHX126

## 2011-11-15 SURGERY — ARTHROPLASTY, SHOULDER, TOTAL
Anesthesia: General | Site: Shoulder | Laterality: Left | Wound class: Clean

## 2011-11-15 MED ORDER — CITALOPRAM HYDROBROMIDE 40 MG PO TABS
40.0000 mg | ORAL_TABLET | Freq: Every day | ORAL | Status: DC
  Administered 2011-11-15 – 2011-11-18 (×4): 40 mg via ORAL
  Filled 2011-11-15 (×5): qty 1

## 2011-11-15 MED ORDER — LIDOCAINE HCL (CARDIAC) 20 MG/ML IV SOLN
INTRAVENOUS | Status: DC | PRN
  Administered 2011-11-15: 80 mg via INTRAVENOUS

## 2011-11-15 MED ORDER — METOCLOPRAMIDE HCL 10 MG PO TABS: 5.0000 mg | ORAL_TABLET | Freq: Three times a day (TID) | ORAL | Status: DC | PRN

## 2011-11-15 MED ORDER — MODAFINIL 200 MG PO TABS
200.0000 mg | ORAL_TABLET | Freq: Every day | ORAL | Status: DC
  Administered 2011-11-16: 200 mg via ORAL
  Filled 2011-11-15 (×2): qty 1

## 2011-11-15 MED ORDER — ROCURONIUM BROMIDE 100 MG/10ML IV SOLN
INTRAVENOUS | Status: DC | PRN
  Administered 2011-11-15: 50 mg via INTRAVENOUS

## 2011-11-15 MED ORDER — HYDROMORPHONE HCL PF 1 MG/ML IJ SOLN: 0.2500 mg | INTRAMUSCULAR | Status: DC | PRN

## 2011-11-15 MED ORDER — SUFENTANIL CITRATE 50 MCG/ML IV SOLN
INTRAVENOUS | Status: DC | PRN
  Administered 2011-11-15: 5 ug via INTRAVENOUS
  Administered 2011-11-15: 20 ug via INTRAVENOUS

## 2011-11-15 MED ORDER — FENTANYL CITRATE 0.05 MG/ML IJ SOLN
50.0000 ug | INTRAMUSCULAR | Status: DC | PRN
  Administered 2011-11-15: 100 ug via INTRAVENOUS

## 2011-11-15 MED ORDER — NEOSTIGMINE METHYLSULFATE 1 MG/ML IJ SOLN
INTRAMUSCULAR | Status: DC | PRN
  Administered 2011-11-15: 5 mg via INTRAVENOUS

## 2011-11-15 MED ORDER — ONDANSETRON HCL 4 MG/2ML IJ SOLN: 4.0000 mg | Freq: Four times a day (QID) | INTRAMUSCULAR | Status: DC | PRN

## 2011-11-15 MED ORDER — HYDROMORPHONE HCL PF 1 MG/ML IJ SOLN
0.5000 mg | INTRAMUSCULAR | Status: DC | PRN
  Administered 2011-11-15 – 2011-11-17 (×4): 1 mg via INTRAVENOUS
  Filled 2011-11-15 (×4): qty 1

## 2011-11-15 MED ORDER — PHENOL 1.4 % MT LIQD: 1.0000 | OROMUCOSAL | Status: DC | PRN

## 2011-11-15 MED ORDER — MENTHOL 3 MG MT LOZG: 1.0000 | LOZENGE | OROMUCOSAL | Status: DC | PRN

## 2011-11-15 MED ORDER — LACTATED RINGERS IV SOLN
INTRAVENOUS | Status: DC
  Administered 2011-11-15: 13:00:00 via INTRAVENOUS

## 2011-11-15 MED ORDER — ALPRAZOLAM 0.25 MG PO TABS
0.2500 mg | ORAL_TABLET | Freq: Every day | ORAL | Status: DC | PRN
  Administered 2011-11-16 – 2011-11-17 (×2): 0.25 mg via ORAL
  Filled 2011-11-15 (×2): qty 1

## 2011-11-15 MED ORDER — BUPROPION HCL ER (SR) 150 MG PO TB12
150.0000 mg | ORAL_TABLET | Freq: Every day | ORAL | Status: DC
  Administered 2011-11-15 – 2011-11-18 (×4): 150 mg via ORAL
  Filled 2011-11-15 (×5): qty 1

## 2011-11-15 MED ORDER — DILTIAZEM HCL ER COATED BEADS 180 MG PO CP24
180.0000 mg | ORAL_CAPSULE | Freq: Every day | ORAL | Status: DC
  Administered 2011-11-16: 180 mg via ORAL
  Filled 2011-11-15 (×5): qty 1

## 2011-11-15 MED ORDER — ONDANSETRON HCL 4 MG PO TABS: 4.0000 mg | ORAL_TABLET | Freq: Four times a day (QID) | ORAL | Status: DC | PRN

## 2011-11-15 MED ORDER — METHOCARBAMOL 100 MG/ML IJ SOLN
500.0000 mg | Freq: Four times a day (QID) | INTRAVENOUS | Status: DC | PRN
  Filled 2011-11-15: qty 5

## 2011-11-15 MED ORDER — OLMESARTAN MEDOXOMIL 40 MG PO TABS
40.0000 mg | ORAL_TABLET | Freq: Every day | ORAL | Status: DC
  Administered 2011-11-16: 40 mg via ORAL
  Filled 2011-11-15 (×4): qty 1

## 2011-11-15 MED ORDER — MIDAZOLAM HCL 2 MG/2ML IJ SOLN
1.0000 mg | INTRAMUSCULAR | Status: DC | PRN
  Administered 2011-11-15: 2 mg via INTRAVENOUS

## 2011-11-15 MED ORDER — LACTATED RINGERS IV SOLN
INTRAVENOUS | Status: DC | PRN
  Administered 2011-11-15 (×2): via INTRAVENOUS

## 2011-11-15 MED ORDER — AMLODIPINE-OLMESARTAN 5-40 MG PO TABS: 1.0000 | ORAL_TABLET | Freq: Every day | ORAL | Status: DC

## 2011-11-15 MED ORDER — EPHEDRINE SULFATE 50 MG/ML IJ SOLN
INTRAMUSCULAR | Status: DC | PRN
  Administered 2011-11-15: 10 mg via INTRAVENOUS

## 2011-11-15 MED ORDER — LIDOCAINE HCL 4 % MT SOLN
OROMUCOSAL | Status: DC | PRN
  Administered 2011-11-15: 4 mL via TOPICAL

## 2011-11-15 MED ORDER — ATORVASTATIN CALCIUM 20 MG PO TABS
20.0000 mg | ORAL_TABLET | Freq: Every day | ORAL | Status: DC
  Administered 2011-11-16 – 2011-11-18 (×3): 20 mg via ORAL
  Filled 2011-11-15 (×5): qty 1

## 2011-11-15 MED ORDER — DOCUSATE SODIUM 100 MG PO CAPS
100.0000 mg | ORAL_CAPSULE | Freq: Two times a day (BID) | ORAL | Status: DC
  Administered 2011-11-15 – 2011-11-18 (×6): 100 mg via ORAL
  Filled 2011-11-15 (×9): qty 1

## 2011-11-15 MED ORDER — DIPHENHYDRAMINE HCL 12.5 MG/5ML PO ELIX: 12.5000 mg | ORAL_SOLUTION | ORAL | Status: DC | PRN

## 2011-11-15 MED ORDER — MIDAZOLAM HCL 2 MG/2ML IJ SOLN
INTRAMUSCULAR | Status: AC
  Filled 2011-11-15: qty 2

## 2011-11-15 MED ORDER — METOCLOPRAMIDE HCL 5 MG/ML IJ SOLN: 5.0000 mg | Freq: Three times a day (TID) | INTRAMUSCULAR | Status: DC | PRN

## 2011-11-15 MED ORDER — ONDANSETRON HCL 4 MG/2ML IJ SOLN
INTRAMUSCULAR | Status: DC | PRN
  Administered 2011-11-15: 4 mg via INTRAVENOUS

## 2011-11-15 MED ORDER — ACETAMINOPHEN 650 MG RE SUPP: 650.0000 mg | Freq: Four times a day (QID) | RECTAL | Status: DC | PRN

## 2011-11-15 MED ORDER — LEVOTHYROXINE SODIUM 200 MCG PO TABS
200.0000 ug | ORAL_TABLET | Freq: Every day | ORAL | Status: DC
  Administered 2011-11-16 – 2011-11-19 (×4): 200 ug via ORAL
  Filled 2011-11-15 (×5): qty 1

## 2011-11-15 MED ORDER — ARIPIPRAZOLE 2 MG PO TABS
2.0000 mg | ORAL_TABLET | Freq: Every day | ORAL | Status: DC
  Administered 2011-11-15 – 2011-11-18 (×4): 2 mg via ORAL
  Filled 2011-11-15 (×5): qty 1

## 2011-11-15 MED ORDER — CEFAZOLIN SODIUM 1-5 GM-% IV SOLN
1.0000 g | Freq: Four times a day (QID) | INTRAVENOUS | Status: AC
  Administered 2011-11-15 – 2011-11-16 (×3): 1 g via INTRAVENOUS
  Filled 2011-11-15 (×3): qty 50

## 2011-11-15 MED ORDER — SODIUM CHLORIDE 0.9 % IR SOLN
Status: DC | PRN
  Administered 2011-11-15: 1000 mL

## 2011-11-15 MED ORDER — GLYCOPYRROLATE 0.2 MG/ML IJ SOLN
INTRAMUSCULAR | Status: DC | PRN
  Administered 2011-11-15: .8 mg via INTRAVENOUS

## 2011-11-15 MED ORDER — METHOCARBAMOL 500 MG PO TABS
500.0000 mg | ORAL_TABLET | Freq: Four times a day (QID) | ORAL | Status: DC | PRN
  Administered 2011-11-16 – 2011-11-19 (×7): 500 mg via ORAL
  Filled 2011-11-15 (×7): qty 1

## 2011-11-15 MED ORDER — OXYCODONE-ACETAMINOPHEN 5-325 MG PO TABS: 1.0000 | ORAL_TABLET | ORAL | Status: DC | PRN

## 2011-11-15 MED ORDER — FENTANYL CITRATE 0.05 MG/ML IJ SOLN
INTRAMUSCULAR | Status: AC
  Filled 2011-11-15: qty 2

## 2011-11-15 MED ORDER — MIDAZOLAM HCL 5 MG/5ML IJ SOLN
INTRAMUSCULAR | Status: DC | PRN
  Administered 2011-11-15: 2 mg via INTRAVENOUS

## 2011-11-15 MED ORDER — LACTATED RINGERS IV SOLN: INTRAVENOUS | Status: DC

## 2011-11-15 MED ORDER — ZOLPIDEM TARTRATE 5 MG PO TABS
5.0000 mg | ORAL_TABLET | Freq: Every evening | ORAL | Status: DC | PRN
  Filled 2011-11-15: qty 1

## 2011-11-15 MED ORDER — LACTATED RINGERS IV SOLN
INTRAVENOUS | Status: DC
  Administered 2011-11-16: 17:00:00 via INTRAVENOUS

## 2011-11-15 MED ORDER — PROPOFOL 10 MG/ML IV EMUL
INTRAVENOUS | Status: DC | PRN
  Administered 2011-11-15: 160 mg via INTRAVENOUS

## 2011-11-15 MED ORDER — AMLODIPINE BESYLATE 5 MG PO TABS
5.0000 mg | ORAL_TABLET | Freq: Every day | ORAL | Status: DC
  Administered 2011-11-16: 5 mg via ORAL
  Filled 2011-11-15 (×4): qty 1

## 2011-11-15 MED ORDER — PANTOPRAZOLE SODIUM 40 MG PO TBEC
40.0000 mg | DELAYED_RELEASE_TABLET | Freq: Every day | ORAL | Status: DC
  Administered 2011-11-16 – 2011-11-18 (×4): 40 mg via ORAL
  Filled 2011-11-15 (×3): qty 1

## 2011-11-15 MED ORDER — KETOROLAC TROMETHAMINE 15 MG/ML IJ SOLN
15.0000 mg | Freq: Four times a day (QID) | INTRAMUSCULAR | Status: AC
  Administered 2011-11-15 – 2011-11-17 (×8): 15 mg via INTRAVENOUS
  Filled 2011-11-15 (×12): qty 1

## 2011-11-15 MED ORDER — ACETAMINOPHEN 325 MG PO TABS: 650.0000 mg | ORAL_TABLET | Freq: Four times a day (QID) | ORAL | Status: DC | PRN

## 2011-11-15 MED ORDER — MORPHINE SULFATE 2 MG/ML IJ SOLN: 0.0500 mg/kg | INTRAMUSCULAR | Status: DC | PRN

## 2011-11-15 SURGICAL SUPPLY — 66 items
BLADE SAW SGTL 83.5X18.5 (BLADE) ×2 IMPLANT
CEMENT BONE DEPUY (Cement) ×1 IMPLANT
CLOTH BEACON ORANGE TIMEOUT ST (SAFETY) ×2 IMPLANT
CLSR STERI-STRIP ANTIMIC 1/2X4 (GAUZE/BANDAGES/DRESSINGS) ×1 IMPLANT
COVER SURGICAL LIGHT HANDLE (MISCELLANEOUS) ×2 IMPLANT
DRAPE INCISE IOBAN 66X45 STRL (DRAPES) ×2 IMPLANT
DRAPE SURG 17X11 SM STRL (DRAPES) ×2 IMPLANT
DRAPE SURG 17X23 STRL (DRAPES) ×2 IMPLANT
DRAPE U-SHAPE 47X51 STRL (DRAPES) ×2 IMPLANT
DRILL BIT 7/64X5 (BIT) IMPLANT
DRSG MEPILEX BORDER 4X8 (GAUZE/BANDAGES/DRESSINGS) ×1 IMPLANT
DURAPREP 26ML APPLICATOR (WOUND CARE) ×2 IMPLANT
ELECT BLADE 4.0 EZ CLEAN MEGAD (MISCELLANEOUS) ×2
ELECT CAUTERY BLADE 6.4 (BLADE) ×2 IMPLANT
ELECT REM PT RETURN 9FT ADLT (ELECTROSURGICAL) ×2
ELECTRODE BLDE 4.0 EZ CLN MEGD (MISCELLANEOUS) ×1 IMPLANT
ELECTRODE REM PT RTRN 9FT ADLT (ELECTROSURGICAL) ×1 IMPLANT
FACESHIELD LNG OPTICON STERILE (SAFETY) ×6 IMPLANT
GLOVE BIO SURGEON STRL SZ 6.5 (GLOVE) ×1 IMPLANT
GLOVE BIO SURGEON STRL SZ7.5 (GLOVE) ×2 IMPLANT
GLOVE BIO SURGEON STRL SZ8 (GLOVE) ×2 IMPLANT
GLOVE BIOGEL PI IND STRL 6.5 (GLOVE) IMPLANT
GLOVE BIOGEL PI IND STRL 7.5 (GLOVE) IMPLANT
GLOVE BIOGEL PI IND STRL 8 (GLOVE) IMPLANT
GLOVE BIOGEL PI INDICATOR 6.5 (GLOVE) ×2
GLOVE BIOGEL PI INDICATOR 7.5 (GLOVE) ×1
GLOVE BIOGEL PI INDICATOR 8 (GLOVE) ×1
GLOVE EUDERMIC 7 POWDERFREE (GLOVE) ×3 IMPLANT
GLOVE SS BIOGEL STRL SZ 7.5 (GLOVE) ×1 IMPLANT
GLOVE SUPERSENSE BIOGEL SZ 7.5 (GLOVE) ×1
GLOVE SURG SS PI 7.5 STRL IVOR (GLOVE) ×3 IMPLANT
GOWN STRL NON-REIN LRG LVL3 (GOWN DISPOSABLE) ×3 IMPLANT
GOWN STRL REIN XL XLG (GOWN DISPOSABLE) ×5 IMPLANT
KIT BASIN OR (CUSTOM PROCEDURE TRAY) ×2 IMPLANT
KIT ROOM TURNOVER OR (KITS) ×2 IMPLANT
MANIFOLD NEPTUNE II (INSTRUMENTS) ×2 IMPLANT
NDL HYPO 25GX1X1/2 BEV (NEEDLE) IMPLANT
NDL SUT 6 .5 CRC .975X.05 MAYO (NEEDLE) ×1 IMPLANT
NEEDLE HYPO 25GX1X1/2 BEV (NEEDLE) IMPLANT
NEEDLE MAYO TAPER (NEEDLE) ×2
NS IRRIG 1000ML POUR BTL (IV SOLUTION) ×2 IMPLANT
PACK SHOULDER (CUSTOM PROCEDURE TRAY) ×2 IMPLANT
PAD ARMBOARD 7.5X6 YLW CONV (MISCELLANEOUS) ×4 IMPLANT
PASSER SUT SWANSON 36MM LOOP (INSTRUMENTS) IMPLANT
PIN METAGLENE 2.5 (PIN) IMPLANT
SLING ARM FOAM STRAP LRG (SOFTGOODS) ×1 IMPLANT
SLING ARM IMMOBILIZER LRG (SOFTGOODS) ×1 IMPLANT
SMARTMIX MINI TOWER (MISCELLANEOUS) ×2
SPONGE LAP 18X18 X RAY DECT (DISPOSABLE) ×2 IMPLANT
SPONGE LAP 4X18 X RAY DECT (DISPOSABLE) ×2 IMPLANT
STRIP CLOSURE SKIN 1/2X4 (GAUZE/BANDAGES/DRESSINGS) ×1 IMPLANT
SUCTION FRAZIER TIP 10 FR DISP (SUCTIONS) ×2 IMPLANT
SUT BONE WAX W31G (SUTURE) IMPLANT
SUT FIBERWIRE #2 38 T-5 BLUE (SUTURE) ×6
SUT MNCRL AB 3-0 PS2 18 (SUTURE) ×2 IMPLANT
SUT VIC AB 1 CT1 27 (SUTURE) ×2
SUT VIC AB 1 CT1 27XBRD ANBCTR (SUTURE) ×3 IMPLANT
SUT VIC AB 2-0 CT1 27 (SUTURE) ×4
SUT VIC AB 2-0 CT1 TAPERPNT 27 (SUTURE) ×2 IMPLANT
SUTURE FIBERWR #2 38 T-5 BLUE (SUTURE) ×2 IMPLANT
SYR 30ML SLIP (SYRINGE) ×1 IMPLANT
SYR CONTROL 10ML LL (SYRINGE) IMPLANT
TOWEL OR 17X24 6PK STRL BLUE (TOWEL DISPOSABLE) ×2 IMPLANT
TOWEL OR 17X26 10 PK STRL BLUE (TOWEL DISPOSABLE) ×2 IMPLANT
TOWER SMARTMIX MINI (MISCELLANEOUS) ×1 IMPLANT
WATER STERILE IRR 1000ML POUR (IV SOLUTION) ×3 IMPLANT

## 2011-11-15 NOTE — Anesthesia Postprocedure Evaluation (Signed)
Anesthesia Post Note  Patient: [name]  Procedure(s) Performed: Procedure(s) (LRB): TOTAL SHOULDER ARTHROPLASTY (Left)  Anesthesia type: General  Patient location: PACU  Post pain: Pain level controlled and Adequate analgesia  Post assessment: Post-op Vital signs reviewed, Patient's Cardiovascular Status Stable, Respiratory Function Stable, Patent Airway and Pain level controlled  Last Vitals:  Filed Vitals:   11/15/11 1633  BP:   Pulse: 61  Temp:   Resp: 14    Post vital signs: Reviewed and stable  Level of consciousness: awake, alert  and oriented  Complications: No apparent anesthesia complications

## 2011-11-15 NOTE — Op Note (Signed)
11/15/2011  3:48 PM  PATIENT:   [name]  76 y.o. female  PRE-OPERATIVE DIAGNOSIS:  OsteoArthritis LEFT SHOULDER   POST-OPERATIVE DIAGNOSIS:  same  PROCEDURE:  L TSA  SURGEON:  Casey Maxfield, Vania Rea. M.D.  ASSISTANTS: Shuford pac   ANESTHESIA:   GET + ISB  EBL: 150  SPECIMEN:  none  Drains: none   PATIENT DISPOSITION:  PACU - hemodynamically stable.    PLAN OF CARE: Admit to inpatient   Dictation# [id number]

## 2011-11-15 NOTE — Anesthesia Procedure Notes (Signed)
Anesthesia Regional Block:  Interscalene brachial plexus block  Pre-Anesthetic Checklist: ,, timeout performed, Correct Patient, Correct Site, Correct Laterality, Correct Procedure, Correct Position, site marked, Risks and benefits discussed,  Surgical consent,  Pre-op evaluation,  At surgeon's request and post-op pain management  Laterality: Right  Prep: chloraprep       Needles:   Needle Type: Stimulator Needle - 40          Additional Needles:  Procedures: ultrasound guided Interscalene brachial plexus block  Nerve Stimulator or Paresthesia:  Response: 0.5 mA,   Additional Responses:   Narrative:  Start time: 11/15/2011 1:30 PM End time: 11/15/2011 1:50 PM Injection made incrementally with aspirations every 5 mL.  Performed by: Personally  Anesthesiologist: Dr. Chaney Malling  Interscalene brachial plexus block

## 2011-11-15 NOTE — H&P (Signed)
Elie Confer Vonbehren    Chief Complaint: OA LEFT SHOULDER  HPI: The patient is a 76 y.o. female with end stage left shoulder arthritis  Past Medical History  Diagnosis Date  . Diabetes mellitus     diet controlled  . Hypertension   . Dyslipidemia   . Sleep apnea     5 yrs --cpap  does not use   . Heart murmur   . Hypothyroidism   . GERD (gastroesophageal reflux disease)   . H/O hiatal hernia   . Arthritis   . Depression     Past Surgical History  Procedure Date  . Breast tumor     secondary to fibrocystic disese  . Cholecystctomy   . Left hip replacement   . Abdominal hysterectomy   . Cardiac catheterization     normal dr. Fredrich Birks  . Joint replacement     left hip  . Cholecystectomy     Family History  Problem Relation Age of Onset  . Anesthesia problems Neg Hx   . Hypotension Neg Hx   . Malignant hyperthermia Neg Hx   . Pseudochol deficiency Neg Hx     Social History:  reports that she quit smoking about 21 years ago. She does not have any smokeless tobacco history on file. She reports that she does not drink alcohol or use illicit drugs.  Allergies:  Allergies  Allergen Reactions  . Tetanus Toxoids Anaphylaxis  . Iohexol      Desc: THROAT SWELLED/ PROBLEMS BREATHING WITH IVP DYE 10 PLUS YEARS AGO     Medications Prior to Admission  Medication Dose Route Frequency Provider Last Rate Last Dose  . ceFAZolin (ANCEF) IVPB 2 g/50 mL premix  2 g Intravenous 60 min Pre-Op Ralene Bathe, PA      . lactated ringers infusion   Intravenous Continuous Ralene Bathe, PA       Medications Prior to Admission  Medication Sig Dispense Refill  . ABILIFY 2 MG tablet Take 2 mg by mouth daily. 1 tab daily      . acyclovir (ZOVIRAX) 800 MG tablet Take 800 mg by mouth as needed. outbreaks      . ALPRAZolam (XANAX) 0.25 MG tablet Take 0.25 mg by mouth daily as needed. Anxiety As needed      . amLODipine-olmesartan (AZOR) 5-40 MG per tablet Take 1 tablet by mouth daily.      Marland Kitchen  buPROPion (WELLBUTRIN SR) 150 MG 12 hr tablet Take 150 mg by mouth daily. 1 tab daily      . citalopram (CELEXA) 40 MG tablet Take 1 tablet (40 mg total) by mouth daily. 1 tab daily  90 tablet  0  . diltiazem (CARDIZEM CD) 180 MG 24 hr capsule Take 180 mg by mouth daily. 1 tab daily      . HYDROcodone-acetaminophen (NORCO) 10-325 MG per tablet Take 1 tablet by mouth every 6 (six) hours as needed. Pain As needed      . levothyroxine (SYNTHROID, LEVOTHROID) 200 MCG tablet Take 200 mcg by mouth daily.      . modafinil (PROVIGIL) 200 MG tablet Take 200 mg by mouth daily.       Marland Kitchen omeprazole (PRILOSEC) 20 MG capsule Take 20 mg by mouth daily.      . rosuvastatin (CRESTOR) 10 MG tablet Take 10 mg by mouth daily.      Marland Kitchen HYDROmorphone (DILAUDID) 2 MG tablet Take 1 tablet (2 mg total) by mouth every 4 (four) hours as needed for  pain.  30 tablet  0     Physical Exam: Left shoulder with painfur arc of motion as noted at her last office visit3/4/13  Vitals  Temp:  [97.5 F (36.4 C)] 97.5 F (36.4 C) (04/11 1047) Pulse Rate:  [63] 63  (04/11 1047) Resp:  [20] 20  (04/11 1047) BP: (132)/(73) 132/73 mmHg (04/11 1047) SpO2:  [96 %] 96 % (04/11 1047)  Assessment/Plan  Impression: OA LEFT SHOULDER   Plan of Action: Procedure(s): TOTAL SHOULDER ARTHROPLASTY  Suella Cogar M 11/15/2011, 1:18 PM

## 2011-11-15 NOTE — Progress Notes (Signed)
Dr. Randa Evens notified patient has ring on left ring finger that will not come off.

## 2011-11-15 NOTE — Transfer of Care (Signed)
Immediate Anesthesia Transfer of Care Note  Patient: [name]  Procedure(s) Performed: Procedure(s) (LRB): TOTAL SHOULDER ARTHROPLASTY (Left)  Patient Location: PACU  Anesthesia Type: GA combined with regional for post-op pain  Level of Consciousness: awake and alert   Airway & Oxygen Therapy: Patient connected to nasal cannula oxygen  Post-op Assessment: Report given to PACU RN  Post vital signs: Reviewed and stable  Complications: No apparent anesthesia complications

## 2011-11-15 NOTE — Anesthesia Preprocedure Evaluation (Addendum)
Anesthesia Evaluation  Patient identified by MRN, date of birth, ID band Patient awake    Reviewed: Allergy & Precautions, H&P , NPO status   Airway Mallampati: II      Dental   Pulmonary sleep apnea ,  breath sounds clear to auscultation        Cardiovascular hypertension, Pt. on medications + CAD Rhythm:Regular Rate:Normal     Neuro/Psych Anxiety Depression negative neurological ROS     GI/Hepatic Neg liver ROS, hiatal hernia, GERD-  ,  Endo/Other  Diabetes mellitus-Hypothyroidism   Renal/GU negative Renal ROS     Musculoskeletal   Abdominal   Peds  Hematology negative hematology ROS (+)   Anesthesia Other Findings   Reproductive/Obstetrics                           Anesthesia Physical Anesthesia Plan  ASA: III  Anesthesia Plan: General   Post-op Pain Management: MAC Combined w/ Regional for Post-op pain   Induction: Intravenous  Airway Management Planned: Mask  Additional Equipment:   Intra-op Plan:   Post-operative Plan:   Informed Consent: I have reviewed the patients History and Physical, chart, labs and discussed the procedure including the risks, benefits and alternatives for the proposed anesthesia with the patient or authorized representative who has indicated his/her understanding and acceptance.   Dental advisory given  Plan Discussed with: CRNA  Anesthesia Plan Comments:        Anesthesia Quick Evaluation

## 2011-11-16 ENCOUNTER — Encounter (HOSPITAL_COMMUNITY): Payer: Self-pay | Admitting: Orthopedic Surgery

## 2011-11-16 LAB — GLUCOSE, CAPILLARY: Glucose-Capillary: 119 mg/dL — ABNORMAL HIGH (ref 70–99)

## 2011-11-16 MED ORDER — HYDROMORPHONE HCL 2 MG PO TABS
2.0000 mg | ORAL_TABLET | ORAL | Status: DC | PRN
  Administered 2011-11-16 (×3): 2 mg via ORAL
  Administered 2011-11-17: 4 mg via ORAL
  Filled 2011-11-16 (×3): qty 1
  Filled 2011-11-16 (×2): qty 2

## 2011-11-16 MED ORDER — HYDROMORPHONE HCL 2 MG PO TABS: 2.0000 mg | ORAL_TABLET | ORAL | Status: DC | PRN

## 2011-11-16 MED ORDER — DIAZEPAM 2 MG PO TABS: 2.0000 mg | ORAL_TABLET | Freq: Four times a day (QID) | ORAL | Status: AC | PRN

## 2011-11-16 NOTE — Op Note (Signed)
[name], [name]             ACCOUNT NO.:  [id number]  MEDICAL RECORD NO.:  [id number]  LOCATION:  5018                         FACILITY:  [facility]  PHYSICIAN:  [name], M.D.  DATE OF BIRTH:  [birth year]  DATE OF PROCEDURE:  11/15/2011 DATE OF DISCHARGE:                              OPERATIVE REPORT   PREOPERATIVE DIAGNOSIS:  End-stage left shoulder osteoarthrosis.  POSTOPERATIVE DIAGNOSIS:  End-stage left shoulder osteoarthrosis.  PROCEDURE:  Left total shoulder arthroplasty utilizing a press-fit size 10, DePuy global stem with a 44 x 18 concentric humeral head, and a 40 cemented pegged glenoid.  SURGEON:  Maria Rea. Rhena Glace, MD  ASSISTANT:  Lucita Lora. Shuford, PA-C  ANESTHESIA:  General endotracheal as well as an interscalene block.  ESTIMATED BLOOD LOSS:  150 mL.  DRAINS:  None.  HISTORY:  [name] is a 76 year old female who had chronic progressive increasing left shoulder pain related to end-stage arthrosis.  She is brought to the operating room at this time for planned left total shoulder arthroplasty.  Preoperatively, I counseled [name] on treatment options as well as risks versus benefits thereof.  Possible surgical complications were reviewed including the potential for bleeding, infection, neurovascular injury, persistent pain, loss of motion, anesthetic complication, failure of the implant, possible need for additional surgery.  She understands and accepts, and agrees with our planned procedure.  PROCEDURE IN DETAIL:  After undergoing routine preoperative evaluation, the patient received prophylactic antibiotics, and an interscalene block was established in the holding area by the Anesthesia Department. Placed supine on the operating table and underwent smooth induction of general endotracheal anesthesia.  Placed in the beach-chair position and appropriately padded and protected.  Left shoulder region was sterilely prepped and draped in standard  fashion.  Time-out was called.  In an anterior approach, the left shoulder was made through a 12-cm incision along the deltopectoral interval.  Skin flaps were elevated and electrocautery was used for hemostasis.  The cephalic vein was identified and retracted laterally with the deltoid.  The deltopectoral interval was then developed bluntly and then the upper centimeter of the pec major was tenotomized to improve exposure.  We then identified the conjoined tendon, it was mobilized and retracted medially and then adhesions in the subacromial/subdeltoid space were divided.  There was significant bursitis with a large fluid collection within the bursal tissues and several calcific deposits as well.  These were all debrided and removed.  We then unroofed the bicipital groove tenotomizing the biceps tendon for later tenodesis.  We then proximally split the rotator cuff along the rotator interval at the base of the coracoid and then subscapularis was divided leaving a distal cuff of 1.5 cm approximately of tendon for later repair, and the free margin was then tagged with a series of grasping #2 FiberWire sutures.  We then continued our dissection anteroinferiorly releasing the capsule from the humeral neck dissecting subperiosteally back past to the 8 o'clock position allowing delivery of the humeral head through the wound and protecting the rotator cuff superiorly and posteriorly.  The humeral head was markedly denuded cartilage.  At this point using an extramedullary guide, we performed our humeral head resection utilizing an oscillating  saw maintaining approximately 30 degrees of retroversion.  The humeral head was then removed and we then performed a sequential hand reamings of the humeral canal up to the size 10, which showed good fit.  We then used the cookie cutter broach to create our starting point in the metaphyseal humerus at 30 degrees of retroversion and then broached up to size  10 with excellent fit.  At this point, we then placed a metal cap across the cut proximal humerus and then proceeded to expose the glenoid with combination of Fukuda, pitchfork and snake-tongue retractors.  We performed circumferential labral excision as well as capsular release allowing complete visualization of the glenoid.  A guidepin was then placed in the center of the glenoid with the 40-mm glenoid shown the best coverage.  We then used the 40 reamer to ream down to a subchondral bony bed.  We then placed our central drill hole and then following this, the peripheral peg holes.  The trial glenoid showed good fit.  We then irrigated meticulously and obtained hemostasis.  Cement mix was mixed and introduced into the peripheral peg holes using the cement inserter and then 40 glenoid was impacted into position with excellent fit and stability.  Once the cement had hardened, we then returned our attention to the proximal humerus where we impacted the size 10 humeral stem at approximately 30 degrees of retroversion to the appropriate depth.  We then performed trialing and the 44 x 18 concentric humeral head had the best soft tissue balance with excellent fit.  We then cleaned the [facility] taper and impacted the final 44 x 18 head.  Final reduction was performed.  We irrigated the joint copiously, obtained hemostasis.  Soft tissue balance and position was much to our satisfaction with excellent motion and excellent stability.  We repaired the subscapularis back to the lesser tuberosity through the cuff of tissue using the #2 FiberWire sutures in horizontal mattress pattern. We then also closed the lateral 2-3 cm of the rotator interval utilizing figure-of-eight #2 FiberWire sutures.  We also performed the biceps tenodesis of the bicipital groove using the FiberWire with good stability achieved.  At this point, the wound was then irrigated and inspected, hemostasis was obtained.  Excellent  shoulder motion was much to our satisfaction.  The deltopectoral interval was then reapproximated with a series of figure-of-eight 0 Vicryl sutures.  2-0 Vicryl was used for the subcu layer and intracuticular 3-0 Monocryl for the skin followed by Steri-Strips.  Dry dressings were applied.  Ralene Bathe, PA-C, utilized throughout this case essential for assistant with positioning of the extremity, retraction, tissue handling, positioning of the implants, wound closure and intraoperative decision making.  At this point, the dry dressing was applied.  Left arm was placed in a sling.  The patient was awakened, extubated, and taken to the recovery room in stable condition.     Maria Rea. Berklee Battey, M.D.     KMS/MEDQ  D:  11/15/2011  T:  11/16/2011  Job:  [id number]

## 2011-11-16 NOTE — Progress Notes (Signed)
CARE MANAGEMENT NOTE 11/16/2011  Patient:  [name], [name]   Account Number:  [id number]  Date Initiated:  11/16/2011  Documentation initiated by:  Vance Peper  Subjective/Objective Assessment:   76 yr old female s/p left shoulder arthroplasty     Action/Plan:   Spoke with patient regarding any home health needs.Patient cares for husband. Son is coming for a few days and daughter is coming to assist with parents also. Waiting for PT to eval for DME needs.   Anticipated DC Date:     Anticipated DC Plan:  HOME W HOME HEALTH SERVICES      DC Planning Services  CM consult      Choice offered to / List presented to:             Status of service:  In process, will continue to follow Medicare Important Message given?   (If response is "NO", the following Medicare IM given date fields will be blank) Date Medicare IM given:   Date Additional Medicare IM given:    Discharge Disposition:    Per UR Regulation:    If discussed at Long Length of Stay Meetings, dates discussed:    Comments:

## 2011-11-16 NOTE — Progress Notes (Signed)
CARE MANAGEMENT NOTE 11/16/2011  Patient:  [name],[name]   Account Number:  [id number]  Date Initiated:  11/16/2011  Documentation initiated by:  Jaree Trinka  Subjective/Objective Assessment:   76 yr old female s/p left shoulder arthroplasty     Action/Plan:   Spoke with patient regarding any home health needs.Patient cares for husband. Son is coming for a few days and daughter is coming to assist with parents also. Waiting for PT to eval for DME needs.   Anticipated DC Date:     Anticipated DC Plan:  HOME W HOME HEALTH SERVICES      DC Planning Services  CM consult      Choice offered to / List presented to:             Status of service:  In process, will continue to follow Medicare Important Message given?   (If response is "NO", the following Medicare IM given date fields will be blank) Date Medicare IM given:   Date Additional Medicare IM given:    Discharge Disposition:    Per UR Regulation:    If discussed at Long Length of Stay Meetings, dates discussed:    Comments:     

## 2011-11-16 NOTE — Progress Notes (Signed)
UR COMPLETED  

## 2011-11-16 NOTE — Progress Notes (Signed)
Occupational Therapy Evaluation Patient Details Name: [name] MRN: [id number] DOB: 06-11-1936 Today's Date: 11/16/2011  Problem List:  Patient Active Problem List  Diagnoses  . CAD (coronary artery disease)  . HTN (hypertension)  . Dyslipidemia  . Shoulder arthritis    Past Medical History:  Past Medical History  Diagnosis Date  . Diabetes mellitus     diet controlled  . Hypertension   . Dyslipidemia   . Sleep apnea     5 yrs --cpap  does not use   . Heart murmur   . Hypothyroidism   . GERD (gastroesophageal reflux disease)   . H/O hiatal hernia   . Arthritis   . Depression    Past Surgical History:  Past Surgical History  Procedure Date  . Breast tumor     secondary to fibrocystic disese  . Cholecystctomy   . Left hip replacement   . Abdominal hysterectomy   . Cardiac catheterization     normal dr. Fredrich Birks  . Joint replacement     left hip  . Cholecystectomy   . Total shoulder arthroplasty 11/15/2011    Procedure: TOTAL SHOULDER ARTHROPLASTY;  Surgeon: Senaida Lange, MD;  Location: [facility] OR;  Service: Orthopedics;  Laterality: Left;  left total shoulder arthroplasty    OT Assessment/Plan/Recommendation OT Assessment Clinical Impression Statement: This 76 y.o. female admitted for Lt. TSA presents to OT with the below listed deficits.  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 reports that he can assist her minimally if given step by step instruction.  Pt's son will be available for a few hours each day, and dtr. will be providing assist starting 11/20/11 pm.  Feel she will benefit from [facility] and Aide.  Will follow acutely to increase safety and independence with BADLs OT Recommendation/Assessment: Patient will need skilled OT in the acute care venue OT Problem List: Decreased range of motion;Decreased knowledge of precautions;Impaired UE functional use;Pain Barriers to Discharge: Decreased caregiver support OT Therapy Diagnosis : Generalized weakness;Acute  pain OT Plan OT Frequency: Min 2X/week OT Treatment/Interventions: Self-care/ADL training;Therapeutic exercise;DME and/or AE instruction;Patient/family education OT Recommendation Follow Up Recommendations: Home health OT;Supervision - Intermittent Equipment Recommended: None recommended by OT Individuals Consulted Consulted and Agree with Results and Recommendations: Patient OT Goals Acute Rehab OT Goals OT Goal Formulation: With patient Time For Goal Achievement: 7 days ADL Goals Pt Will Perform Upper Body Bathing: with set-up;Sitting, chair ADL Goal: Upper Body Bathing - Progress: Goal set today Pt Will Perform Lower Body Bathing: with set-up;Sit to stand from bed ADL Goal: Lower Body Bathing - Progress: Goal set today Pt Will Perform Upper Body Dressing: with supervision;Unsupported;Sitting, bed ADL Goal: Upper Body Dressing - Progress: Goal set today Pt Will Perform Lower Body Dressing: with set-up;Sit to stand from bed ADL Goal: Lower Body Dressing - Progress: Goal set today Pt Will Perform Toileting - Clothing Manipulation: with modified independence;Standing ADL Goal: Toileting - Clothing Manipulation - Progress: Goal set today Pt Will Perform Toileting - Hygiene: with modified independence;Sit to stand from 3-in-1/toilet ADL Goal: Toileting - Hygiene - Progress: Goal set today Additional ADL Goal #1: Pt. will be independent, or will be able to independently instruct pt. on how to don/doff sling ADL Goal: Additional Goal #1 - Progress: Goal set today Additional ADL Goal #2: Pt. will be independent with pendulum exercises ADL Goal: Additional Goal #2 - Progress: Goal set today  OT Evaluation Precautions/Restrictions  Precautions Precautions: Shoulder Type of Shoulder Precautions: Pendulums, ER 30; Abd  60; Flex 90 Required Braces or Orthoses: Other Brace/Splint Other Brace/Splint: sling Restrictions Weight Bearing Restrictions: Yes LUE Weight Bearing: Non weight  bearing Prior Functioning Home Living Lives With: Spouse;Son (Husband has history of "mini" strokes, and ICH) Available Help at Discharge: Family (Son can provide ~4-5 hours/day, and dtr will be avail Tues.) Type of Home: House Home Access: Stairs to enter [employer] of Steps: 3 Entrance Stairs-Rails: Left Home Layout: One level Bathroom Shower/Tub: Tub/shower unit;Curtain Bathroom Toilet: Handicapped height Bathroom Accessibility: Yes How Accessible: Accessible via walker Additional Comments: Pt is primary caregiver for spouse - limited assistance/support available until Tues. 4/16 Prior Function Level of Independence: Independent Able to Take Stairs?: Yes Driving: Yes Vocation: Retired  ADL ADL Eating/Feeding: Simulated;Set up Where Assessed - Eating/Feeding: Bed level Grooming: Simulated;Wash/dry hands;Wash/dry face;Teeth care;Brushing hair;Supervision/safety Where Assessed - Grooming: Standing at sink Upper Body Bathing: Simulated;Supervision/safety Upper Body Bathing Details (indicate cue type and reason): Pt. instructed in proper method of bathing without moving Lt. shoulder, and able to simulate Where Assessed - Upper Body Bathing: Unsupported;Sitting, bed Lower Body Bathing: Simulated;Supervision/safety Where Assessed - Lower Body Bathing: Standing in shower Upper Body Dressing: Simulated;Moderate assistance Upper Body Dressing Details (indicate cue type and reason): Pt. instructed in proper technique Where Assessed - Upper Body Dressing: Supported;Sitting, bed Lower Body Dressing: Simulated;Minimal assistance Where Assessed - Lower Body Dressing: Sit to stand from chair;Sit to stand from bed Toilet Transfer: Simulated;Supervision/safety Toilet Transfer Method: Proofreader: Comfort height toilet Toileting - Clothing Manipulation: Simulated;Minimal assistance Where Assessed - Glass blower/designer Manipulation: Standing Toileting -  Hygiene: Simulated;Modified independent Where Assessed - Toileting Hygiene: Sit on 3-in-1 or toilet Ambulation Related to ADLs: Pt. ambulated with supervision secondary to pain meds ADL Comments: Pt instructed in shoulder precautions, sling wear and care - requires min A to don sling, safe technique for ADLs.  This afternoon pt. reports that husband can physically assist her with things such as sling application, pulling shirt over shoulder, etc, and that he does not require signficant assistance from her. Vision/Perception    Cognition Cognition Arousal/Alertness: Awake/alert Overall Cognitive Status: Appears within functional limits for tasks assessed Orientation Level: Oriented to person;Oriented to place;Oriented to time Sensation/Coordination Sensation Light Touch: Appears Intact Extremity Assessment RUE Assessment RUE Assessment: Within Functional Limits LUE Assessment LUE Assessment: Exceptions to WFL LUE AROM (degrees) LUE Overall AROM Comments: limited due to surgery Mobility  Bed Mobility Bed Mobility: Yes Supine to Sit: 4: Min assist;HOB elevated (Comment degrees) Supine to Sit Details (indicate cue type and reason): Pt. reports she sleeps in recliner.  Required assistance to lift shoulders off the bed Sitting - Scoot to Edge of Bed: 5: Supervision Sit to Supine: 4: Min assist;HOB flat Sit to Supine - Details (indicate cue type and reason): assist to lower shoulders Transfers Transfers: Yes Sit to Stand: 5: Supervision Stand to Sit: 5: Supervision Exercises Shoulder Exercises Pendulum Exercise: Left;10 reps;Standing Shoulder Flexion: PROM;10 reps;Supine;Left (90) Shoulder ABduction: PROM;Left;10 reps;Supine (60) Shoulder External Rotation: PROM;Left;10 reps;Supine (30) Elbow Flexion: AROM;10 reps;Left;Standing Elbow Extension: AROM;Left;10 reps;Standing Wrist Flexion: AROM;Left;10 reps;Standing Wrist Extension: AROM;Left;10 reps;Standing Digit Composite Flexion:  AROM;10 reps;Standing;Left Composite Extension: AROM;Left;Standing;10 reps Neck Flexion: AROM;10 reps;Seated Neck Extension: AROM;10 reps;Seated Neck Lateral Flexion - Right: AROM;10 reps;Seated Neck Lateral Flexion - Left: AROM;10 reps;Seated End of Session OT - End of Session Equipment Utilized During Treatment: Other (comment) (sling) Activity Tolerance: Patient tolerated treatment well Patient left: in bed;with call bell in reach;with bed alarm set General Behavior During  Session: [facility] for tasks performed Cognition: [facility] for tasks performed   Sarena Jezek M 11/16/2011, 10:00 PM

## 2011-11-16 NOTE — Discharge Summary (Signed)
PATIENT ID:      [name]  MRN:     [id number] DOB/AGE:    1936-04-07 / 76 y.o.     DISCHARGE SUMMARY  ADMISSION DATE:    11/15/2011 DISCHARGE DATE:   11/19/11   ADMISSION DIAGNOSIS: OA LEFT SHOULDER   (OsteoArthritis LEFT SHOULDER )  DISCHARGE DIAGNOSIS:  OsteoArthritis LEFT SHOULDER     ADDITIONAL DIAGNOSIS: Active Problems:  Shoulder arthritis  Past Medical History  Diagnosis Date  . Diabetes mellitus     diet controlled  . Hypertension   . Dyslipidemia   . Sleep apnea     5 yrs --cpap  does not use   . Heart murmur   . Hypothyroidism   . GERD (gastroesophageal reflux disease)   . H/O hiatal hernia   . Arthritis   . Depression     PROCEDURE: Procedure(s): TOTAL SHOULDER ARTHROPLASTY on 11/15/2011  CONSULTS:     HISTORY:  See H&P in chart  HOSPITAL COURSE:  [name] is a 76 y.o. admitted on 11/15/2011 and found to have a diagnosis of OsteoArthritis LEFT SHOULDER .  After appropriate laboratory studies were obtained  they were taken to the operating room on 11/15/2011 and underwent Procedure(s): TOTAL SHOULDER ARTHROPLASTY.   They were given perioperative antibiotics:  Anti-infectives     Start     Dose/Rate Route Frequency Ordered Stop   11/15/11 2030   ceFAZolin (ANCEF) IVPB 1 g/50 mL premix        1 g 100 mL/hr over 30 Minutes Intravenous Every 6 hours 11/15/11 1833 11/16/11 1429   11/14/11 1425   ceFAZolin (ANCEF) IVPB 2 g/50 mL premix        2 g 100 mL/hr over 30 Minutes Intravenous 60 min pre-op 11/14/11 1425 11/15/11 1435        . Blood products given:none  Patient was with expected pain on POD 1 but otherwise was doing well. She was NVI with moderate bruising. The remainder of the hospital course was dedicated to ambulation and strengthening.   The patient was discharged on 1 Day Post-Op in  Good condition.   addnedum  Pt continued having pain and did not have help over weekend and felt unsafe to DC home.  On 11/19/11 she was under  much better pain control and had son planning to stay with her. She was orthopaedically stable to DC home

## 2011-11-17 MED ORDER — HYDROCODONE-ACETAMINOPHEN 10-325 MG PO TABS
1.0000 | ORAL_TABLET | ORAL | Status: DC | PRN
  Administered 2011-11-17 – 2011-11-18 (×4): 1 via ORAL
  Filled 2011-11-17 (×4): qty 1

## 2011-11-17 NOTE — Progress Notes (Signed)
Subjective: Procedure(s) (LRB): TOTAL SHOULDER ARTHROPLASTY (Left) 2 Days Post-Op  Patient reports pain as moderate.  Positive void Negative bowel movement Positive flatus Negative chest pain or shortness of breath  Objective: Vital signs in last 24 hours: Temp:  [98 F (36.7 C)-98.9 F (37.2 C)] 98.6 F (37 C) (04/13 0535) Pulse Rate:  [55-65] 55  (04/13 0535) Resp:  [18-20] 18  (04/13 0535) BP: (111-140)/(30-53) 111/30 mmHg (04/13 0535) SpO2:  [95 %-98 %] 96 % (04/13 0535) Weight:  [94.214 kg (207 lb 11.3 oz)] 94.214 kg (207 lb 11.3 oz) (04/12 2101)  Intake/Output from previous day: 04/12 0701 - 04/13 0700 In: 510 [P.O.:270; I.V.:240] Out: -   No results found for this basename: WBC:2,RBC:2,HCT:2,PLT:2 in the last 72 hours No results found for this basename: NA:2,K:2,CL:2,CO2:2,BUN:2,CREATININE:2,GLUCOSE:2,CALCIUM:2 in the last 72 hours No results found for this basename: LABPT:2,INR:2 in the last 72 hours  ABD soft Neurovascular intact Sensation intact distally Incision: dressing C/D/I  Assessment/Plan: Patient stable  Increased nausea and pain Continue care - pain control today Possible dc tomorrow   [name] 11/17/2011, 10:38 AM

## 2011-11-17 NOTE — Progress Notes (Signed)
Occupational Therapy Treatment Patient Details Name: [name] MRN: [id number] DOB: 03/18/1936 Today's Date: 11/17/2011  OT Assessment/Plan OT Assessment/Plan Comments on Treatment Session: Pt. progressing well today. Pt. continues to require minimal assist to facilitate proper technique during pendulum exercises and due to decreased assist at home over this weekend , pt. may benefit from another day of therapy to promote pt. safety at home. OT Plan: Discharge plan remains appropriate OT Frequency: Min 2X/week Follow Up Recommendations: Home health OT;Supervision - Intermittent Equipment Recommended: None recommended by OT OT Goals Acute Rehab OT Goals OT Goal Formulation: With patient Time For Goal Achievement: 7 days ADL Goals Pt Will Perform Upper Body Dressing: with supervision;Unsupported;Sitting, bed ADL Goal: Upper Body Dressing - Progress: Progressing toward goals Pt Will Perform Toileting - Clothing Manipulation: with modified independence;Standing ADL Goal: Toileting - Clothing Manipulation - Progress: Met Pt Will Perform Toileting - Hygiene: with modified independence;Sit to stand from 3-in-1/toilet ADL Goal: Toileting - Hygiene - Progress: Met Additional ADL Goal #1: Pt. will be independent, or will be able to independently instruct pt. on how to don/doff sling ADL Goal: Additional Goal #1 - Progress: Progressing toward goals Additional ADL Goal #2: Pt. will be independent with pendulum exercises ADL Goal: Additional Goal #2 - Progress: Progressing toward goals  OT Treatment Precautions/Restrictions  Precautions Precautions: Shoulder Type of Shoulder Precautions: Pendulums, ER 30; Abd 60; Flex 90 Required Braces or Orthoses: Other Brace/Splint Other Brace/Splint: sling Restrictions Weight Bearing Restrictions: Yes LUE Weight Bearing: Non weight bearing   ADL ADL Grooming: Performed;Wash/dry hands;Set up;Supervision/safety Where Assessed - Grooming:  Standing at sink Upper Body Dressing: Performed;Minimal assistance Upper Body Dressing Details (indicate cue type and reason): Pt. instructed in proper technique Where Assessed - Upper Body Dressing: Supported;Sitting, bed Toilet Transfer: Performed;Modified independent Toilet Transfer Method: Proofreader: Comfort height toilet Toileting - Clothing Manipulation: Performed;Modified independent Where Assessed - Toileting Clothing Manipulation: Standing Toileting - Hygiene: Performed;Modified independent Where Assessed - Toileting Hygiene: Sit on 3-in-1 or toilet Ambulation Related to ADLs: Pt. mod I ~10' ADL Comments: Pt. re-educated on don/doffing sling and required min assist. Pt. completed pendulum exercises with min assist provided at hips to facilitate proper technique.  Mobility  Bed Mobility Bed Mobility: Yes Supine to Sit: 6: Modified independent (Device/Increase time) Transfers Transfers: Yes Sit to Stand: 6: Modified independent (Device/Increase time);From bed Exercises Shoulder Exercises Pendulum Exercise: Left;10 reps;Standing Elbow Flexion: AROM;10 reps;Left;Standing Elbow Extension: AROM;Left;10 reps;Standing Wrist Flexion: AROM;Left;10 reps;Standing Wrist Extension: AROM;Left;10 reps;Standing Digit Composite Flexion: AROM;10 reps;Standing;Left Composite Extension: AROM;Left;Standing;10 reps Neck Flexion: AROM;10 reps;Seated Neck Extension: AROM;10 reps;Seated Neck Lateral Flexion - Right: AROM;10 reps;Seated Neck Lateral Flexion - Left: AROM;10 reps;Seated  End of Session OT - End of Session Equipment Utilized During Treatment: Other (comment) (sling) Activity Tolerance: Patient tolerated treatment well Patient left: in chair;with call bell in reach Nurse Communication: Mobility status for transfers General Behavior During Session: [facility] for tasks performed Cognition: [facility] for tasks performed  Admire Bunnell, OTR/L Pager [phone]    11/17/2011, 10:22 AM

## 2011-11-18 MED ORDER — POLYETHYLENE GLYCOL 3350 17 G PO PACK
17.0000 g | PACK | Freq: Every day | ORAL | Status: DC
  Filled 2011-11-18: qty 1

## 2011-11-18 MED ORDER — OXYCODONE-ACETAMINOPHEN 5-325 MG PO TABS
1.0000 | ORAL_TABLET | ORAL | Status: DC | PRN
  Administered 2011-11-18 (×2): 1 via ORAL
  Administered 2011-11-18 – 2011-11-19 (×3): 2 via ORAL
  Filled 2011-11-18 (×3): qty 2
  Filled 2011-11-18 (×2): qty 1

## 2011-11-18 NOTE — Progress Notes (Signed)
Patient ID: [name], female   DOB: 1935-11-06, 76 y.o.   MRN: [id number] Subjective: 3 Days Post-Op Procedure(s) (LRB): TOTAL SHOULDER ARTHROPLASTY (Left)    Patient reports pain as moderate. Still dealing with quite a bit of discomfort, worried about getting home and having to function with the pain  Objective:   VITALS:   Filed Vitals:   11/18/11 0621  BP: 143/48  Pulse: 57  Temp: 98.1 F (36.7 C)  Resp: 18    Neurologically intact Incision: dressing C/D/I  LABS No results found for this basename: HGB:3,HCT:3,WBC:3,PLT:3 in the last 72 hours  No results found for this basename: NA:3,K:3,BUN:3,CREATININE:3,GLUCOSE:3 in the last 72 hours  No results found for this basename: LABPT:2,INR:2 in the last 72 hours   Assessment/Plan: 3 Days Post-Op Procedure(s) (LRB): TOTAL SHOULDER ARTHROPLASTY (Left)   Plan for discharge tomorrow  Will modify pain regiment to see if it make a difference Plan for primary team to re-evaluate in the am for D/C  Added also miralax to try and prevent constipation while on stronger pain med

## 2011-11-18 NOTE — Progress Notes (Signed)
Occupational Therapy Treatment Patient Details Name: [name] MRN: [id number] DOB: 27-Sep-1935 Today's Date: 11/18/2011  OT Assessment/Plan OT Assessment/Plan Comments on Treatment Session: Making good progress. Feel that pt will be able to D/C home. Her daughter is coming into town Tuesday night. If her son could stay with her Monday night, that would be best. Pt will need HHOT and HH aid.  OT Plan: Discharge plan remains appropriate OT Frequency: Min 2X/week Follow Up Recommendations: Home health OT;Supervision - Intermittent Equipment Recommended: None recommended by OT OT Goals Acute Rehab OT Goals OT Goal Formulation: With patient Time For Goal Achievement: 7 days ADL Goals Pt Will Perform Upper Body Bathing: with set-up;Sitting, chair ADL Goal: Upper Body Bathing - Progress: Met Pt Will Perform Lower Body Bathing: with set-up;Sit to stand from bed ADL Goal: Lower Body Bathing - Progress: Met Pt Will Perform Upper Body Dressing: with supervision;Unsupported;Sitting, bed ADL Goal: Upper Body Dressing - Progress: Met Pt Will Perform Lower Body Dressing: with set-up;Sit to stand from bed ADL Goal: Lower Body Dressing - Progress: Met Pt Will Perform Toileting - Clothing Manipulation: with modified independence;Standing ADL Goal: Toileting - Clothing Manipulation - Progress: Met Pt Will Perform Toileting - Hygiene: with modified independence;Sit to stand from 3-in-1/toilet ADL Goal: Toileting - Hygiene - Progress: Met Additional ADL Goal #1: Pt. will be independent, or will be able to independently instruct pt. on how to don/doff sling ADL Goal: Additional Goal #1 - Progress: Progressing toward goals Additional ADL Goal #2: Pt. will be independent with pendulum exercises ADL Goal: Additional Goal #2 - Progress: Progressing toward goals  OT Treatment Precautions/Restrictions  Precautions Precautions: Shoulder Type of Shoulder Precautions: Pendulums, ER 30; Abd 60; Flex  90 Precaution Booklet Issued: Yes (comment) Required Braces or Orthoses: Other Brace/Splint Other Brace/Splint: sling Restrictions Weight Bearing Restrictions: Yes LUE Weight Bearing: Non weight bearing   ADL ADL Eating/Feeding: Performed;Independent Where Assessed - Eating/Feeding: Chair Grooming: Performed;Modified independent Where Assessed - Grooming: Standing at sink Upper Body Bathing: Performed;Modified independent Where Assessed - Upper Body Bathing: Standing at sink Lower Body Bathing: Simulated;Modified independent Where Assessed - Lower Body Bathing: Standing at sink Upper Body Dressing: Performed;Supervision/safety Upper Body Dressing Details (indicate cue type and reason): Pt. instructed in proper technique Where Assessed - Upper Body Dressing: Supported;Sitting, bed Lower Body Dressing: Simulated;Supervision/safety Where Assessed - Lower Body Dressing: Sit to stand from bed Toilet Transfer: Performed;Modified independent Toilet Transfer Method: Proofreader: Comfort height toilet Toileting - Clothing Manipulation: Performed;Modified independent Where Assessed - Toileting Clothing Manipulation: Standing Toileting - Hygiene: Performed;Modified independent Where Assessed - Toileting Hygiene: Sit on 3-in-1 or toilet Tub/Shower Transfer: Not assessed Ambulation Related to ADLs: mod I ADL Comments: ADL, sling mgnt. Pt able to complete at overall S/Mod I. Mobility  Bed Mobility Bed Mobility: Yes Supine to Sit: 6: Modified independent (Device/Increase time) Transfers Transfers: Yes Sit to Stand: 6: Modified independent (Device/Increase time);From bed Exercises Shoulder Exercises Pendulum Exercise: Left;10 reps;Standing Shoulder Flexion: PROM;10 reps;Supine;Left Shoulder ABduction: PROM;Left;10 reps;Supine Shoulder External Rotation: PROM;Left;10 reps;Supine Elbow Flexion: AROM;10 reps;Left;Standing Elbow Extension: AROM;Left;10  reps;Standing Wrist Flexion: AROM;Left;10 reps;Standing Wrist Extension: AROM;Left;10 reps;Standing Digit Composite Flexion: AROM;10 reps;Standing;Left Composite Extension: AROM;Left;Standing;10 reps Neck Flexion: AROM;10 reps;Seated Neck Extension: AROM;10 reps;Seated Neck Lateral Flexion - Right: AROM;10 reps;Seated Neck Lateral Flexion - Left: AROM;10 reps;Seated  End of Session OT - End of Session Activity Tolerance: Patient tolerated treatment well Patient left: in chair;with call bell in reach Nurse Communication: Other (comment) (donning sling) General  Behavior During Session: [facility] for tasks performed Cognition: [facility] for tasks performed  [facility]  11/18/2011, 6:34 PM [facility], OTR/L  [phone] 11/18/2011

## 2011-11-19 MED ORDER — OXYCODONE-ACETAMINOPHEN 5-325 MG PO TABS: 1.0000 | ORAL_TABLET | ORAL | Status: AC | PRN

## 2011-11-19 NOTE — Progress Notes (Signed)
CARE MANAGEMENT NOTE 11/19/2011  Anticipated DC Date:  11/19/2011   Anticipated DC Plan:  HOME W HOME HEALTH SERVICES      DC Planning Services  CM consult      [facility] Choice  HOME HEALTH   Choice offered to / List presented to:  C-1 Patient        HH arranged  HH-2 PT  HH-3 OT  HH-4 NURSE'S AIDE      HH agency  [facility]   Status of service:  Completed, signed off Medicare Important Message given?   (If response is "NO", the following Medicare IM given date fields will be blank) Date Medicare IM given:   Date Additional Medicare IM given:    Discharge Disposition:  HOME W HOME HEALTH SERVICES

## 2011-11-19 NOTE — Progress Notes (Signed)
Occupational Therapy Treatment Patient Details Name: [name] MRN: [id number] DOB: 12-20-1935 Today's Date: 11/19/2011  OT Assessment/Plan OT Assessment/Plan Comments on Treatment Session: REady for D/C. Needs HHOT and HHaid OT Plan: Discharge plan remains appropriate OT Frequency: Min 2X/week Follow Up Recommendations: Home health OT;Supervision - Intermittent Equipment Recommended: None recommended by OT OT Goals Acute Rehab OT Goals OT Goal Formulation: With patient Time For Goal Achievement: 7 days ADL Goals Pt Will Perform Upper Body Bathing: with set-up;Sitting, chair ADL Goal: Upper Body Bathing - Progress: Met Pt Will Perform Lower Body Bathing: with set-up;Sit to stand from bed ADL Goal: Lower Body Bathing - Progress: Met Pt Will Perform Upper Body Dressing: with supervision;Unsupported;Sitting, bed ADL Goal: Upper Body Dressing - Progress: Met Pt Will Perform Lower Body Dressing: with set-up;Sit to stand from bed ADL Goal: Lower Body Dressing - Progress: Met Pt Will Perform Toileting - Clothing Manipulation: with modified independence;Standing ADL Goal: Toileting - Clothing Manipulation - Progress: Met Pt Will Perform Toileting - Hygiene: with modified independence;Sit to stand from 3-in-1/toilet ADL Goal: Toileting - Hygiene - Progress: Met Additional ADL Goal #1: Pt. will be independent, or will be able to independently instruct pt. on how to don/doff sling ADL Goal: Additional Goal #1 - Progress: Met Additional ADL Goal #2: Pt. will be independent with pendulum exercises ADL Goal: Additional Goal #2 - Progress: Met  OT Treatment Precautions/Restrictions  Precautions Precautions: Shoulder Type of Shoulder Precautions: Pendulums, ER 30; Abd 60; Flex 90 Precaution Booklet Issued: Yes (comment) Required Braces or Orthoses: Other Brace/Splint Other Brace/Splint: sling Restrictions Weight Bearing Restrictions: Yes LUE Weight Bearing: Non weight bearing     ADL ADL Ambulation Related to ADLs: mod I ADL Comments: Mod I/S with ADL Mobility    Exercises Shoulder Exercises Pendulum Exercise: Left;10 reps;Standing Shoulder Flexion: PROM;10 reps;Supine;Left Shoulder ABduction: PROM;Left;10 reps;Supine Shoulder External Rotation: PROM;Left;10 reps;Supine Elbow Flexion: AROM;10 reps;Left;Standing Elbow Extension: AROM;Left;10 reps;Standing Wrist Flexion: AROM;Left;10 reps;Standing Wrist Extension: AROM;Left;10 reps;Standing Digit Composite Flexion: AROM;10 reps;Standing;Left Composite Extension: AROM;Left;Standing;10 reps Neck Flexion: AROM;10 reps;Seated Neck Extension: AROM;10 reps;Seated Neck Lateral Flexion - Right: AROM;10 reps;Seated Neck Lateral Flexion - Left: AROM;10 reps;Seated  End of Session OT - End of Session Equipment Utilized During Treatment: Other (comment) Activity Tolerance: Patient tolerated treatment well Patient left: in chair;with call bell in reach Nurse Communication: Other (comment) (D/C status) General Behavior During Session: [facility] for tasks performed Cognition: [facility] for tasks performed  [facility]  11/19/2011, 8:33 AM [facility], OTR/L  [phone] 11/19/2011

## 2011-11-21 DIAGNOSIS — R269 Unspecified abnormalities of gait and mobility: Secondary | ICD-10-CM | POA: Diagnosis not present

## 2011-11-21 DIAGNOSIS — Z471 Aftercare following joint replacement surgery: Secondary | ICD-10-CM | POA: Diagnosis not present

## 2011-11-21 DIAGNOSIS — Z5189 Encounter for other specified aftercare: Secondary | ICD-10-CM | POA: Diagnosis not present

## 2011-11-21 DIAGNOSIS — M19019 Primary osteoarthritis, unspecified shoulder: Secondary | ICD-10-CM | POA: Diagnosis not present

## 2011-11-21 DIAGNOSIS — Z4801 Encounter for change or removal of surgical wound dressing: Secondary | ICD-10-CM | POA: Diagnosis not present

## 2011-11-21 DIAGNOSIS — Z96619 Presence of unspecified artificial shoulder joint: Secondary | ICD-10-CM | POA: Diagnosis not present

## 2011-11-27 DIAGNOSIS — E039 Hypothyroidism, unspecified: Secondary | ICD-10-CM | POA: Diagnosis not present

## 2011-11-30 DIAGNOSIS — M19019 Primary osteoarthritis, unspecified shoulder: Secondary | ICD-10-CM | POA: Diagnosis not present

## 2011-12-07 DIAGNOSIS — M19019 Primary osteoarthritis, unspecified shoulder: Secondary | ICD-10-CM | POA: Diagnosis not present

## 2011-12-14 DIAGNOSIS — M19019 Primary osteoarthritis, unspecified shoulder: Secondary | ICD-10-CM | POA: Diagnosis not present

## 2011-12-27 DIAGNOSIS — R7309 Other abnormal glucose: Secondary | ICD-10-CM | POA: Diagnosis not present

## 2011-12-27 DIAGNOSIS — E782 Mixed hyperlipidemia: Secondary | ICD-10-CM | POA: Diagnosis not present

## 2011-12-27 DIAGNOSIS — Z79899 Other long term (current) drug therapy: Secondary | ICD-10-CM | POA: Diagnosis not present

## 2011-12-27 DIAGNOSIS — I10 Essential (primary) hypertension: Secondary | ICD-10-CM | POA: Diagnosis not present

## 2011-12-27 DIAGNOSIS — E559 Vitamin D deficiency, unspecified: Secondary | ICD-10-CM | POA: Diagnosis not present

## 2011-12-28 DIAGNOSIS — M19019 Primary osteoarthritis, unspecified shoulder: Secondary | ICD-10-CM | POA: Diagnosis not present

## 2012-01-02 DIAGNOSIS — M19019 Primary osteoarthritis, unspecified shoulder: Secondary | ICD-10-CM | POA: Diagnosis not present

## 2012-01-11 DIAGNOSIS — M1710 Unilateral primary osteoarthritis, unspecified knee: Secondary | ICD-10-CM | POA: Diagnosis not present

## 2012-01-15 DIAGNOSIS — M1710 Unilateral primary osteoarthritis, unspecified knee: Secondary | ICD-10-CM | POA: Diagnosis not present

## 2012-01-18 DIAGNOSIS — M25519 Pain in unspecified shoulder: Secondary | ICD-10-CM | POA: Diagnosis not present

## 2012-01-23 DIAGNOSIS — M25519 Pain in unspecified shoulder: Secondary | ICD-10-CM | POA: Diagnosis not present

## 2012-02-05 DIAGNOSIS — I10 Essential (primary) hypertension: Secondary | ICD-10-CM | POA: Diagnosis not present

## 2012-02-05 DIAGNOSIS — E039 Hypothyroidism, unspecified: Secondary | ICD-10-CM | POA: Diagnosis not present

## 2012-02-05 DIAGNOSIS — D649 Anemia, unspecified: Secondary | ICD-10-CM | POA: Diagnosis not present

## 2012-02-15 DIAGNOSIS — M1710 Unilateral primary osteoarthritis, unspecified knee: Secondary | ICD-10-CM | POA: Diagnosis not present

## 2012-02-22 DIAGNOSIS — M1710 Unilateral primary osteoarthritis, unspecified knee: Secondary | ICD-10-CM | POA: Diagnosis not present

## 2012-02-27 DIAGNOSIS — M1710 Unilateral primary osteoarthritis, unspecified knee: Secondary | ICD-10-CM | POA: Diagnosis not present

## 2012-03-20 ENCOUNTER — Encounter: Payer: Self-pay | Admitting: Internal Medicine

## 2012-03-20 DIAGNOSIS — E039 Hypothyroidism, unspecified: Secondary | ICD-10-CM | POA: Diagnosis not present

## 2012-03-20 DIAGNOSIS — I10 Essential (primary) hypertension: Secondary | ICD-10-CM | POA: Diagnosis not present

## 2012-03-26 DIAGNOSIS — J40 Bronchitis, not specified as acute or chronic: Secondary | ICD-10-CM | POA: Diagnosis not present

## 2012-03-28 ENCOUNTER — Ambulatory Visit (HOSPITAL_COMMUNITY)
Admission: RE | Admit: 2012-03-28 | Discharge: 2012-03-28 | Disposition: A | Discharge status: Home / Self Care | Payer: Medicare Other | Source: Ambulatory Visit | Attending: Internal Medicine | Admitting: Internal Medicine

## 2012-03-28 ENCOUNTER — Other Ambulatory Visit (HOSPITAL_COMMUNITY): Payer: Self-pay | Admitting: Internal Medicine

## 2012-03-28 DIAGNOSIS — R062 Wheezing: Secondary | ICD-10-CM | POA: Diagnosis not present

## 2012-03-28 DIAGNOSIS — J984 Other disorders of lung: Secondary | ICD-10-CM | POA: Insufficient documentation

## 2012-03-28 DIAGNOSIS — R0602 Shortness of breath: Secondary | ICD-10-CM | POA: Insufficient documentation

## 2012-03-28 DIAGNOSIS — J9819 Other pulmonary collapse: Secondary | ICD-10-CM | POA: Diagnosis not present

## 2012-03-28 DIAGNOSIS — R0989 Other specified symptoms and signs involving the circulatory and respiratory systems: Secondary | ICD-10-CM | POA: Diagnosis not present

## 2012-04-09 DIAGNOSIS — R7309 Other abnormal glucose: Secondary | ICD-10-CM | POA: Diagnosis not present

## 2012-04-09 DIAGNOSIS — E782 Mixed hyperlipidemia: Secondary | ICD-10-CM | POA: Diagnosis not present

## 2012-04-09 DIAGNOSIS — E039 Hypothyroidism, unspecified: Secondary | ICD-10-CM | POA: Diagnosis not present

## 2012-04-09 DIAGNOSIS — I10 Essential (primary) hypertension: Secondary | ICD-10-CM | POA: Diagnosis not present

## 2012-04-22 ENCOUNTER — Encounter: Payer: Self-pay | Admitting: Internal Medicine

## 2012-04-22 DIAGNOSIS — E782 Mixed hyperlipidemia: Secondary | ICD-10-CM | POA: Diagnosis not present

## 2012-04-22 DIAGNOSIS — I10 Essential (primary) hypertension: Secondary | ICD-10-CM | POA: Diagnosis not present

## 2012-04-22 DIAGNOSIS — E559 Vitamin D deficiency, unspecified: Secondary | ICD-10-CM | POA: Diagnosis not present

## 2012-04-22 DIAGNOSIS — R7309 Other abnormal glucose: Secondary | ICD-10-CM | POA: Diagnosis not present

## 2012-04-22 DIAGNOSIS — Z79899 Other long term (current) drug therapy: Secondary | ICD-10-CM | POA: Diagnosis not present

## 2012-05-19 DIAGNOSIS — H01029 Squamous blepharitis unspecified eye, unspecified eyelid: Secondary | ICD-10-CM | POA: Diagnosis not present

## 2012-05-21 DIAGNOSIS — M25519 Pain in unspecified shoulder: Secondary | ICD-10-CM | POA: Diagnosis not present

## 2012-05-22 DIAGNOSIS — R7989 Other specified abnormal findings of blood chemistry: Secondary | ICD-10-CM | POA: Diagnosis not present

## 2012-05-22 DIAGNOSIS — Z139 Encounter for screening, unspecified: Secondary | ICD-10-CM | POA: Diagnosis not present

## 2012-05-22 DIAGNOSIS — E039 Hypothyroidism, unspecified: Secondary | ICD-10-CM | POA: Diagnosis not present

## 2012-06-02 ENCOUNTER — Encounter: Payer: Self-pay | Admitting: Internal Medicine

## 2012-06-02 ENCOUNTER — Ambulatory Visit (INDEPENDENT_AMBULATORY_CARE_PROVIDER_SITE_OTHER): Payer: Medicare Other | Admitting: Internal Medicine

## 2012-06-02 VITALS — BP 140/69 | HR 53 | Ht 63.0 in | Wt 206.0 lb

## 2012-06-02 DIAGNOSIS — I2510 Atherosclerotic heart disease of native coronary artery without angina pectoris: Secondary | ICD-10-CM | POA: Diagnosis not present

## 2012-06-02 DIAGNOSIS — I10 Essential (primary) hypertension: Secondary | ICD-10-CM | POA: Diagnosis not present

## 2012-06-02 NOTE — Patient Instructions (Signed)
Your physician wants you to follow-up in:10 months You will receive a reminder letter in the mail two months in advance. If you don't receive a letter, please call our office to schedule the follow-up appointment.  

## 2012-06-02 NOTE — Progress Notes (Signed)
HPI Patient is a 76 year old with a history of CAD  Previously followed at [facility] by Dr. Lynnea Ferrier. She has a history of mild to moderate CAD (Cath 2009 after abnormmal perfusion test. 50% mid/distal LAD; 50% L Cx), 60% R renal artery stenosis, OSA (not using), DM, HTN, Dyslpidemia. She is also seen by Dr. Oneta Rack. I saw her in January.  Since seen she denies CP  No SOB.  Had a few days of dizziness earlier this fall  Resolved with antivert  Patient remains under increased stress due to husband, caring for him.  Allergies  Allergen Reactions  . Tetanus Toxoids Anaphylaxis  . Iohexol      Desc: THROAT SWELLED/ PROBLEMS BREATHING WITH IVP DYE 10 PLUS YEARS AGO     Current Outpatient Prescriptions  Medication Sig Dispense Refill  . ABILIFY 2 MG tablet Take 2 mg by mouth daily. 1 tab daily      . acyclovir (ZOVIRAX) 800 MG tablet Take 800 mg by mouth as needed. outbreaks      . ALPRAZolam (XANAX) 0.25 MG tablet Take 0.25 mg by mouth daily as needed. Anxiety As needed      . amLODipine-olmesartan (AZOR) 5-40 MG per tablet Take 1 tablet by mouth daily.      Marland Kitchen buPROPion (WELLBUTRIN SR) 150 MG 12 hr tablet Take 150 mg by mouth daily. 1 tab daily      . citalopram (CELEXA) 40 MG tablet Take 1 tablet (40 mg total) by mouth daily. 1 tab daily  90 tablet  0  . diltiazem (CARDIZEM CD) 180 MG 24 hr capsule Take 180 mg by mouth daily. 1 tab daily      . HYDROcodone-acetaminophen (NORCO/VICODIN) 5-325 MG per tablet As needed      . levothyroxine (SYNTHROID, LEVOTHROID) 200 MCG tablet Take 200 mcg by mouth daily.      . meclizine (ANTIVERT) 25 MG tablet For dizziness      . modafinil (PROVIGIL) 200 MG tablet Take 200 mg by mouth daily. As needed      . omeprazole (PRILOSEC) 20 MG capsule Take 20 mg by mouth daily.      Marland Kitchen PROAIR HFA 108 (90 BASE) MCG/ACT inhaler As needed      . rosuvastatin (CRESTOR) 10 MG tablet Take 10 mg by mouth daily.        Past Medical History  Diagnosis  Date  . Diabetes mellitus     diet controlled  . Hypertension   . Dyslipidemia   . Sleep apnea     5 yrs --cpap  does not use   . Heart murmur   . Hypothyroidism   . GERD (gastroesophageal reflux disease)   . H/O hiatal hernia   . Arthritis   . Depression     Past Surgical History  Procedure Date  . Breast tumor     secondary to fibrocystic disese  . Cholecystctomy   . Left hip replacement   . Abdominal hysterectomy   . Cardiac catheterization     normal dr. Fredrich Birks  . Joint replacement     left hip  . Cholecystectomy   . Total shoulder arthroplasty 11/15/2011    Procedure: TOTAL SHOULDER ARTHROPLASTY;  Surgeon: Senaida Lange, MD;  Location: [facility] OR;  Service: Orthopedics;  Laterality: Left;  left total shoulder arthroplasty    Family History  Problem Relation Age of Onset  . Anesthesia problems Neg Hx   . Hypotension Neg Hx   .  Malignant hyperthermia Neg Hx   . Pseudochol deficiency Neg Hx     History   Social History  . Marital Status: Married    Spouse Name: N/A    Number of Children: N/A  . Years of Education: N/A   Occupational History  . Not on file.   Social History Main Topics  . Smoking status: Former Smoker    Quit date: 08/06/1990  . Smokeless tobacco: Not on file  . Alcohol Use: No  . Drug Use: No  . Sexually Active: No   Other Topics Concern  . Not on file   Social History Narrative   Married2 childrenRetired from [employer] , no longer works. She does not use alcohol or tobacco    Review of Systems:  All systems reviewed.  They are negative to the above problem except as previously stated.  Vital Signs: BP 140/69  Pulse 53  Ht 5\' 3"  (1.6 m)  Wt 206 lb (93.441 kg)  BMI 36.49 kg/m2  Physical Exam Patient is in NAD HEENT:  Normocephalic, atraumatic. EOMI, PERRLA.  Neck: JVP is normal.  No bruits.  Lungs: clear to auscultation. No rales no wheezes.  Heart: Regular rate and rhythm. Normal S1, S2. No S3.   No significant murmurs. PMI not  displaced.  Abdomen:  Supple, nontender. Normal bowel sounds. No masses. No hepatomegaly.  Extremities:   Good distal pulses throughout. No lower extremity edema.  Musculoskeletal :moving all extremities.  Neuro:   alert and oriented x3.  CN II-XII grossly intact.  EKG:  SB  53 bpm.  Occasional PAC, PVC Assessment and Plan:  1.  CAD  I am not convinced of any angina.  Keep on same regimen  2.  HTN  Adequate control  3.  HL  Will check labs from Dr Kathryne Sharper office.

## 2012-06-04 DIAGNOSIS — Z23 Encounter for immunization: Secondary | ICD-10-CM | POA: Diagnosis not present

## 2012-06-04 DIAGNOSIS — N3000 Acute cystitis without hematuria: Secondary | ICD-10-CM | POA: Diagnosis not present

## 2012-06-18 DIAGNOSIS — H01029 Squamous blepharitis unspecified eye, unspecified eyelid: Secondary | ICD-10-CM | POA: Diagnosis not present

## 2012-06-18 DIAGNOSIS — H2510 Age-related nuclear cataract, unspecified eye: Secondary | ICD-10-CM | POA: Diagnosis not present

## 2012-06-26 DIAGNOSIS — M1710 Unilateral primary osteoarthritis, unspecified knee: Secondary | ICD-10-CM | POA: Diagnosis not present

## 2012-07-22 DIAGNOSIS — N3000 Acute cystitis without hematuria: Secondary | ICD-10-CM | POA: Diagnosis not present

## 2012-07-22 DIAGNOSIS — Z79899 Other long term (current) drug therapy: Secondary | ICD-10-CM | POA: Diagnosis not present

## 2012-07-22 DIAGNOSIS — I10 Essential (primary) hypertension: Secondary | ICD-10-CM | POA: Diagnosis not present

## 2012-07-22 DIAGNOSIS — E559 Vitamin D deficiency, unspecified: Secondary | ICD-10-CM | POA: Diagnosis not present

## 2012-07-22 DIAGNOSIS — R7309 Other abnormal glucose: Secondary | ICD-10-CM | POA: Diagnosis not present

## 2012-07-22 DIAGNOSIS — E782 Mixed hyperlipidemia: Secondary | ICD-10-CM | POA: Diagnosis not present

## 2012-08-28 DIAGNOSIS — J309 Allergic rhinitis, unspecified: Secondary | ICD-10-CM | POA: Diagnosis not present

## 2012-09-01 DIAGNOSIS — M1710 Unilateral primary osteoarthritis, unspecified knee: Secondary | ICD-10-CM | POA: Diagnosis not present

## 2012-09-09 DIAGNOSIS — M1710 Unilateral primary osteoarthritis, unspecified knee: Secondary | ICD-10-CM | POA: Diagnosis not present

## 2012-09-15 DIAGNOSIS — M1710 Unilateral primary osteoarthritis, unspecified knee: Secondary | ICD-10-CM | POA: Diagnosis not present

## 2012-09-27 ENCOUNTER — Encounter (HOSPITAL_COMMUNITY): Payer: Self-pay | Admitting: Emergency Medicine

## 2012-09-27 ENCOUNTER — Emergency Department (HOSPITAL_COMMUNITY): Payer: Medicare Other

## 2012-09-27 ENCOUNTER — Emergency Department (HOSPITAL_COMMUNITY)
Admission: EM | Admit: 2012-09-27 | Discharge: 2012-09-27 | Disposition: A | Discharge status: Home / Self Care | Payer: Medicare Other | Attending: Emergency Medicine | Admitting: Emergency Medicine

## 2012-09-27 DIAGNOSIS — M545 Low back pain, unspecified: Secondary | ICD-10-CM | POA: Insufficient documentation

## 2012-09-27 DIAGNOSIS — Z79899 Other long term (current) drug therapy: Secondary | ICD-10-CM | POA: Insufficient documentation

## 2012-09-27 DIAGNOSIS — F329 Major depressive disorder, single episode, unspecified: Secondary | ICD-10-CM | POA: Diagnosis not present

## 2012-09-27 DIAGNOSIS — E119 Type 2 diabetes mellitus without complications: Secondary | ICD-10-CM | POA: Diagnosis not present

## 2012-09-27 DIAGNOSIS — R011 Cardiac murmur, unspecified: Secondary | ICD-10-CM | POA: Insufficient documentation

## 2012-09-27 DIAGNOSIS — E039 Hypothyroidism, unspecified: Secondary | ICD-10-CM | POA: Diagnosis not present

## 2012-09-27 DIAGNOSIS — Z8719 Personal history of other diseases of the digestive system: Secondary | ICD-10-CM | POA: Diagnosis not present

## 2012-09-27 DIAGNOSIS — F3289 Other specified depressive episodes: Secondary | ICD-10-CM | POA: Insufficient documentation

## 2012-09-27 DIAGNOSIS — F411 Generalized anxiety disorder: Secondary | ICD-10-CM | POA: Diagnosis not present

## 2012-09-27 DIAGNOSIS — Z8739 Personal history of other diseases of the musculoskeletal system and connective tissue: Secondary | ICD-10-CM | POA: Insufficient documentation

## 2012-09-27 DIAGNOSIS — I10 Essential (primary) hypertension: Secondary | ICD-10-CM | POA: Diagnosis not present

## 2012-09-27 DIAGNOSIS — G4730 Sleep apnea, unspecified: Secondary | ICD-10-CM | POA: Diagnosis not present

## 2012-09-27 DIAGNOSIS — M5137 Other intervertebral disc degeneration, lumbosacral region: Secondary | ICD-10-CM | POA: Diagnosis not present

## 2012-09-27 DIAGNOSIS — K219 Gastro-esophageal reflux disease without esophagitis: Secondary | ICD-10-CM | POA: Diagnosis not present

## 2012-09-27 DIAGNOSIS — M47817 Spondylosis without myelopathy or radiculopathy, lumbosacral region: Secondary | ICD-10-CM | POA: Diagnosis not present

## 2012-09-27 DIAGNOSIS — E785 Hyperlipidemia, unspecified: Secondary | ICD-10-CM | POA: Diagnosis not present

## 2012-09-27 LAB — URINALYSIS, ROUTINE W REFLEX MICROSCOPIC
Bilirubin Urine: NEGATIVE
Nitrite: NEGATIVE
Protein, ur: NEGATIVE mg/dL
Specific Gravity, Urine: 1.021 (ref 1.005–1.030)
Urobilinogen, UA: 1 mg/dL (ref 0.0–1.0)

## 2012-09-27 LAB — URINE MICROSCOPIC-ADD ON

## 2012-09-27 MED ORDER — OXYCODONE-ACETAMINOPHEN 5-325 MG PO TABS
1.0000 | ORAL_TABLET | Freq: Once | ORAL | Status: DC
  Filled 2012-09-27: qty 1

## 2012-09-27 MED ORDER — IBUPROFEN 200 MG PO TABS
400.0000 mg | ORAL_TABLET | Freq: Once | ORAL | Status: AC
  Administered 2012-09-27: 400 mg via ORAL
  Filled 2012-09-27: qty 2

## 2012-09-27 MED ORDER — OXYCODONE-ACETAMINOPHEN 5-325 MG PO TABS: 2.0000 | ORAL_TABLET | ORAL | Status: DC | PRN

## 2012-09-27 MED ORDER — IBUPROFEN 200 MG PO TABS: 600.0000 mg | ORAL_TABLET | Freq: Once | ORAL | Status: DC

## 2012-09-27 NOTE — ED Notes (Signed)
Pt presents w/ low back which radiates down legs bilaterally, onset yesterday. Denies injury or falling. Pt states she just buried her husband yesterday and seems tearful/upset.

## 2012-09-27 NOTE — ED Notes (Signed)
Pt unable to urinate at this time.  

## 2012-09-27 NOTE — ED Provider Notes (Signed)
History     CSN: [id number]  Arrival date & time 09/27/12  1557   First MD Initiated Contact with Patient 09/27/12 1958      Chief Complaint  Patient presents with  . Back Pain    (Consider location/radiation/quality/duration/timing/severity/associated sxs/prior treatment) Patient is a 77 y.o. female presenting with back pain. The history is provided by the patient. No language interpreter was used.  Back Pain Location:  Lumbar spine Quality:  Aching, burning and shooting Radiates to:  R posterior upper leg and L posterior upper leg Pain severity:  Moderate Pain is:  Unable to specify Onset quality:  Sudden Duration:  2 days Timing:  Intermittent Progression:  Worsening Chronicity:  New Context: emotional stress   Context: not falling, not physical stress and not recent injury   Relieved by:  Nothing Worsened by:  Standing Associated symptoms: no bowel incontinence, no chest pain, no dysuria, no fever, no numbness, no paresthesias, no perianal numbness and no weakness   Risk factors: no recent surgery    77 year old female coming in complaining of intermittent lower back pain x2 days. States that the pain is 5/10 it was intermittent and worsening pain 10 out of 10 to her that radiates down both her legs. States the pain is a throbbing pain over her lumbar spine to person sometimes when she sits or stands. She denies CVA tenderness. She's taking Norco for pain has no improvement. States that the pain is better when she is sitting still. Patient has been under stress for the last week. She. Her husband yesterday. Patient has not taken her Celexa for a couple weeks. PCP is Dr. Lynford Humphrey. She denies burning with urination, fever, nausea vomiting, injury or weakness to her lower extremities. Past medical history of diet-controlled diabetes, CAD, hypertension, sleep apnea, hypothyroid thyroidism and GERD.    Past Medical History  Diagnosis Date  . Diabetes mellitus     diet controlled   . Hypertension   . Dyslipidemia   . Sleep apnea     5 yrs --cpap  does not use   . Heart murmur   . Hypothyroidism   . GERD (gastroesophageal reflux disease)   . H/O hiatal hernia   . Arthritis   . Depression     Past Surgical History  Procedure Laterality Date  . Breast tumor      secondary to fibrocystic disese  . Cholecystctomy    . Left hip replacement    . Abdominal hysterectomy    . Cardiac catheterization      normal dr. Fredrich Birks  . Joint replacement      left hip  . Cholecystectomy    . Total shoulder arthroplasty  11/15/2011    Procedure: TOTAL SHOULDER ARTHROPLASTY;  Surgeon: Senaida Lange, MD;  Location: [facility] OR;  Service: Orthopedics;  Laterality: Left;  left total shoulder arthroplasty    Family History  Problem Relation Age of Onset  . Anesthesia problems Neg Hx   . Hypotension Neg Hx   . Malignant hyperthermia Neg Hx   . Pseudochol deficiency Neg Hx     History  Substance Use Topics  . Smoking status: Former Smoker    Quit date: 08/06/1990  . Smokeless tobacco: Never Used  . Alcohol Use: No    OB History   Grav Para Term Preterm Abortions TAB SAB Ect Mult Living                  Review of Systems  Constitutional: Negative.  Negative for fever.  HENT: Negative.   Eyes: Negative.   Respiratory: Negative.   Cardiovascular: Negative.  Negative for chest pain and leg swelling.  Gastrointestinal: Negative.  Negative for nausea, vomiting and bowel incontinence.  Genitourinary: Negative for dysuria and difficulty urinating.  Musculoskeletal: Positive for back pain and arthralgias. Negative for joint swelling and gait problem.  Neurological: Negative.  Negative for weakness, numbness and paresthesias.  Psychiatric/Behavioral: Negative.   All other systems reviewed and are negative.    Allergies  Tetanus toxoids and Iohexol  Home Medications   Current Outpatient Rx  Name  Route  Sig  Dispense  Refill  . ABILIFY 2 MG tablet   Oral   Take 2  mg by mouth daily. 1 tab daily         . acyclovir (ZOVIRAX) 800 MG tablet   Oral   Take 800 mg by mouth as needed. outbreaks         . albuterol (PROVENTIL HFA;VENTOLIN HFA) 108 (90 BASE) MCG/ACT inhaler   Inhalation   Inhale 2 puffs into the lungs every 6 (six) hours as needed for wheezing.         Marland Kitchen ALPRAZolam (XANAX) 0.25 MG tablet   Oral   Take 0.25 mg by mouth daily as needed. Anxiety As needed         . amLODipine-olmesartan (AZOR) 5-40 MG per tablet   Oral   Take 1 tablet by mouth daily.         Marland Kitchen buPROPion (WELLBUTRIN SR) 150 MG 12 hr tablet   Oral   Take 150 mg by mouth daily. 1 tab daily         . citalopram (CELEXA) 40 MG tablet   Oral   Take 1 tablet (40 mg total) by mouth daily. 1 tab daily   90 tablet   0   . diltiazem (CARDIZEM CD) 180 MG 24 hr capsule   Oral   Take 180 mg by mouth daily. 1 tab daily         . HYDROcodone-acetaminophen (NORCO/VICODIN) 5-325 MG per tablet   Oral   Take 1 tablet by mouth every 6 (six) hours as needed for pain. As needed         . levothyroxine (SYNTHROID, LEVOTHROID) 200 MCG tablet   Oral   Take 200 mcg by mouth daily.         . meclizine (ANTIVERT) 25 MG tablet   Oral   Take 25 mg by mouth 3 (three) times daily as needed for dizziness. For dizziness         . modafinil (PROVIGIL) 200 MG tablet   Oral   Take 200 mg by mouth daily. As needed         . omeprazole (PRILOSEC) 20 MG capsule   Oral   Take 20 mg by mouth daily.         . rosuvastatin (CRESTOR) 10 MG tablet   Oral   Take 10 mg by mouth daily.           BP 181/66  Pulse 58  Temp(Src) 98.4 F (36.9 C) (Oral)  Resp 20  SpO2 98%  Physical Exam  Nursing note and vitals reviewed. Constitutional: She is oriented to person, place, and time. She appears well-developed and well-nourished.  HENT:  Head: Normocephalic and atraumatic.  Eyes: Conjunctivae and EOM are normal. Pupils are equal, round, and reactive to light.  Neck:  Normal range of motion. Neck supple.  Cardiovascular:  Normal rate.   Murmur heard. Pulmonary/Chest: Effort normal and breath sounds normal. No respiratory distress.  Abdominal: Soft.  Musculoskeletal: Normal range of motion. She exhibits no edema and no tenderness.  Neurological: She is alert and oriented to person, place, and time. She has normal reflexes. She displays normal reflexes.  Skin: Skin is warm and dry.  Psychiatric: She has a normal mood and affect.    ED Course  Procedures (including critical care time)   Lumbar films pending.  She could no find a ride home so percocet was cancelled.  Ibuprofen for pain.  Dr. Ethelda Chick in for evaluation.    Labs Reviewed  URINALYSIS, ROUTINE W REFLEX MICROSCOPIC   No results found.   No diagnosis found.    MDM  Lumbar pain with sciatica x 2 days.  rx for percocet for pain.  Some relief with ibuprofen in the ER.  Shared visit with Dr. Shela Commons.  Lumbar films show arthritis.  U/a pending culture with many WBC.  Ambulating without difficulty.  Will follow up with her pcp next week.  Shared visit with Dr. Neil Crouch, NP 09/28/12 [phone]

## 2012-09-27 NOTE — ED Provider Notes (Signed)
Complains of low back pain rating to bilateral partial thighs onset yesterday worse with movement improved with remaining still no bowel pain feels improved since treatment in the emergency department. Patient is alert not ill-appearing ambulatory without difficulty back without point tenderness. Pain felt to be musculoskeletal in etiology  Doug Sou, MD 09/27/12 2203

## 2012-09-27 NOTE — ED Notes (Signed)
Pt ambulated to BR to attempt urine sample

## 2012-09-28 NOTE — ED Provider Notes (Signed)
Medical screening examination/treatment/procedure(s) were conducted as a shared visit with non-physician practitioner(s) and myself.  I personally evaluated the patient during the encounter  Doug Sou, MD 09/28/12 [phone]

## 2012-09-29 DIAGNOSIS — M4310 Spondylolisthesis, site unspecified: Secondary | ICD-10-CM | POA: Diagnosis not present

## 2012-09-29 LAB — URINE CULTURE

## 2012-10-23 DIAGNOSIS — E782 Mixed hyperlipidemia: Secondary | ICD-10-CM | POA: Diagnosis not present

## 2012-10-23 DIAGNOSIS — I10 Essential (primary) hypertension: Secondary | ICD-10-CM | POA: Diagnosis not present

## 2012-10-23 DIAGNOSIS — E559 Vitamin D deficiency, unspecified: Secondary | ICD-10-CM | POA: Diagnosis not present

## 2012-10-23 DIAGNOSIS — R7309 Other abnormal glucose: Secondary | ICD-10-CM | POA: Diagnosis not present

## 2012-10-23 DIAGNOSIS — Z79899 Other long term (current) drug therapy: Secondary | ICD-10-CM | POA: Diagnosis not present

## 2012-10-24 DIAGNOSIS — M47817 Spondylosis without myelopathy or radiculopathy, lumbosacral region: Secondary | ICD-10-CM | POA: Diagnosis not present

## 2012-10-24 DIAGNOSIS — M4310 Spondylolisthesis, site unspecified: Secondary | ICD-10-CM | POA: Diagnosis not present

## 2012-10-24 DIAGNOSIS — M5137 Other intervertebral disc degeneration, lumbosacral region: Secondary | ICD-10-CM | POA: Diagnosis not present

## 2012-11-27 DIAGNOSIS — E538 Deficiency of other specified B group vitamins: Secondary | ICD-10-CM | POA: Diagnosis not present

## 2012-11-27 DIAGNOSIS — M79609 Pain in unspecified limb: Secondary | ICD-10-CM | POA: Diagnosis not present

## 2012-11-27 DIAGNOSIS — E039 Hypothyroidism, unspecified: Secondary | ICD-10-CM | POA: Diagnosis not present

## 2012-11-27 DIAGNOSIS — D649 Anemia, unspecified: Secondary | ICD-10-CM | POA: Diagnosis not present

## 2012-11-27 DIAGNOSIS — Z1212 Encounter for screening for malignant neoplasm of rectum: Secondary | ICD-10-CM | POA: Diagnosis not present

## 2012-11-27 DIAGNOSIS — I10 Essential (primary) hypertension: Secondary | ICD-10-CM | POA: Diagnosis not present

## 2012-11-27 DIAGNOSIS — E782 Mixed hyperlipidemia: Secondary | ICD-10-CM | POA: Diagnosis not present

## 2012-12-03 ENCOUNTER — Other Ambulatory Visit: Payer: Self-pay | Admitting: Internal Medicine

## 2012-12-03 DIAGNOSIS — R7989 Other specified abnormal findings of blood chemistry: Secondary | ICD-10-CM

## 2012-12-05 ENCOUNTER — Ambulatory Visit
Admission: RE | Admit: 2012-12-05 | Discharge: 2012-12-05 | Disposition: A | Discharge status: Home / Self Care | Payer: Medicare Other | Source: Ambulatory Visit | Attending: Internal Medicine | Admitting: Internal Medicine

## 2012-12-05 DIAGNOSIS — R7989 Other specified abnormal findings of blood chemistry: Secondary | ICD-10-CM

## 2012-12-05 DIAGNOSIS — E0789 Other specified disorders of thyroid: Secondary | ICD-10-CM | POA: Diagnosis not present

## 2012-12-24 DIAGNOSIS — K120 Recurrent oral aphthae: Secondary | ICD-10-CM | POA: Diagnosis not present

## 2013-01-12 DIAGNOSIS — N3000 Acute cystitis without hematuria: Secondary | ICD-10-CM | POA: Diagnosis not present

## 2013-01-14 DIAGNOSIS — L821 Other seborrheic keratosis: Secondary | ICD-10-CM | POA: Diagnosis not present

## 2013-01-15 DIAGNOSIS — F339 Major depressive disorder, recurrent, unspecified: Secondary | ICD-10-CM | POA: Diagnosis not present

## 2013-01-15 DIAGNOSIS — Z87891 Personal history of nicotine dependence: Secondary | ICD-10-CM | POA: Diagnosis not present

## 2013-01-23 DIAGNOSIS — M1710 Unilateral primary osteoarthritis, unspecified knee: Secondary | ICD-10-CM | POA: Diagnosis not present

## 2013-01-23 DIAGNOSIS — M25579 Pain in unspecified ankle and joints of unspecified foot: Secondary | ICD-10-CM | POA: Diagnosis not present

## 2013-02-05 DIAGNOSIS — M25579 Pain in unspecified ankle and joints of unspecified foot: Secondary | ICD-10-CM | POA: Diagnosis not present

## 2013-02-12 DIAGNOSIS — F339 Major depressive disorder, recurrent, unspecified: Secondary | ICD-10-CM | POA: Diagnosis not present

## 2013-02-25 DIAGNOSIS — M1710 Unilateral primary osteoarthritis, unspecified knee: Secondary | ICD-10-CM | POA: Diagnosis not present

## 2013-03-02 DIAGNOSIS — M1710 Unilateral primary osteoarthritis, unspecified knee: Secondary | ICD-10-CM | POA: Diagnosis not present

## 2013-03-02 DIAGNOSIS — M25569 Pain in unspecified knee: Secondary | ICD-10-CM | POA: Diagnosis not present

## 2013-03-05 ENCOUNTER — Encounter: Payer: Self-pay | Admitting: Internal Medicine

## 2013-03-05 DIAGNOSIS — R7309 Other abnormal glucose: Secondary | ICD-10-CM | POA: Diagnosis not present

## 2013-03-05 DIAGNOSIS — E559 Vitamin D deficiency, unspecified: Secondary | ICD-10-CM | POA: Diagnosis not present

## 2013-03-05 DIAGNOSIS — N3000 Acute cystitis without hematuria: Secondary | ICD-10-CM | POA: Diagnosis not present

## 2013-03-05 DIAGNOSIS — I10 Essential (primary) hypertension: Secondary | ICD-10-CM | POA: Diagnosis not present

## 2013-03-05 DIAGNOSIS — Z79899 Other long term (current) drug therapy: Secondary | ICD-10-CM | POA: Diagnosis not present

## 2013-03-05 DIAGNOSIS — E782 Mixed hyperlipidemia: Secondary | ICD-10-CM | POA: Diagnosis not present

## 2013-03-17 DIAGNOSIS — M1710 Unilateral primary osteoarthritis, unspecified knee: Secondary | ICD-10-CM | POA: Diagnosis not present

## 2013-04-02 DIAGNOSIS — M1710 Unilateral primary osteoarthritis, unspecified knee: Secondary | ICD-10-CM | POA: Diagnosis not present

## 2013-04-09 DIAGNOSIS — M1710 Unilateral primary osteoarthritis, unspecified knee: Secondary | ICD-10-CM | POA: Diagnosis not present

## 2013-04-09 DIAGNOSIS — F339 Major depressive disorder, recurrent, unspecified: Secondary | ICD-10-CM | POA: Diagnosis not present

## 2013-04-17 DIAGNOSIS — F329 Major depressive disorder, single episode, unspecified: Secondary | ICD-10-CM | POA: Insufficient documentation

## 2013-04-17 DIAGNOSIS — F32A Depression, unspecified: Secondary | ICD-10-CM | POA: Insufficient documentation

## 2013-04-17 DIAGNOSIS — K219 Gastro-esophageal reflux disease without esophagitis: Secondary | ICD-10-CM | POA: Insufficient documentation

## 2013-04-17 DIAGNOSIS — E039 Hypothyroidism, unspecified: Secondary | ICD-10-CM | POA: Insufficient documentation

## 2013-04-17 DIAGNOSIS — M1710 Unilateral primary osteoarthritis, unspecified knee: Secondary | ICD-10-CM | POA: Diagnosis not present

## 2013-04-20 ENCOUNTER — Encounter: Payer: Self-pay | Admitting: Internal Medicine

## 2013-04-20 ENCOUNTER — Ambulatory Visit (INDEPENDENT_AMBULATORY_CARE_PROVIDER_SITE_OTHER): Payer: Medicare Other | Admitting: Internal Medicine

## 2013-04-20 VITALS — BP 157/63 | HR 79 | Ht 63.5 in | Wt 194.0 lb

## 2013-04-20 DIAGNOSIS — I2510 Atherosclerotic heart disease of native coronary artery without angina pectoris: Secondary | ICD-10-CM

## 2013-04-20 NOTE — Patient Instructions (Addendum)
We will call Dr Lucky Cowboy to get a copy of your labs. Call his office to see if you can bring your blood pressure machine in to compare and make sure yours is working problem. If you have any problems with that call   Your physician wants you to follow-up in: 1 year You will receive a reminder letter in the mail two months in advance. If you don't receive a letter, please call our office to schedule the follow-up appointment.

## 2013-04-20 NOTE — Progress Notes (Signed)
HPI Patient is a 77 year old with a history of CAD  Previously followed at [facility] by Dr. Lynnea Ferrier. She has a history of mild to moderate CAD (Cath 2009 after abnormmal perfusion test. 50% mid/distal LAD; 50% L Cx), 60% R renal artery stenosis, OSA (not using), DM, HTN, Dyslpidemia. She is also seen by Dr. Oneta Rack. I saw her in October 2013  BP at home 135/72 Denies CP  Breatig is OK  No palpitations.  Allergies  Allergen Reactions  . Tetanus Toxoids Anaphylaxis  . Iohexol      Desc: THROAT SWELLED/ PROBLEMS BREATHING WITH IVP DYE 10 PLUS YEARS AGO     Current Outpatient Prescriptions  Medication Sig Dispense Refill  . acyclovir (ZOVIRAX) 800 MG tablet Take 800 mg by mouth as needed. outbreaks      . albuterol (PROVENTIL HFA;VENTOLIN HFA) 108 (90 BASE) MCG/ACT inhaler Inhale 2 puffs into the lungs every 6 (six) hours as needed for wheezing.      Marland Kitchen ALPRAZolam (XANAX) 0.25 MG tablet Take 0.25 mg by mouth daily as needed. Anxiety As needed      . amLODipine-olmesartan (AZOR) 5-40 MG per tablet Take 1 tablet by mouth daily.      Marland Kitchen buPROPion (WELLBUTRIN SR) 150 MG 12 hr tablet Take 150 mg by mouth daily. 1 tab daily      . diltiazem (CARDIZEM CD) 180 MG 24 hr capsule Take 180 mg by mouth daily. 1 tab daily      . DULoxetine (CYMBALTA) 30 MG capsule Take 30 mg by mouth 3 (three) times daily.       Marland Kitchen HYDROcodone-acetaminophen (NORCO/VICODIN) 5-325 MG per tablet Take 1 tablet by mouth every 6 (six) hours as needed for pain. As needed      . levothyroxine (SYNTHROID, LEVOTHROID) 200 MCG tablet Take 200 mcg by mouth daily.      . magnesium citrate SOLN Take 1 Bottle by mouth once.      . meclizine (ANTIVERT) 25 MG tablet Take 25 mg by mouth 3 (three) times daily as needed for dizziness. For dizziness      . modafinil (PROVIGIL) 200 MG tablet Take 200 mg by mouth daily. As needed      . omeprazole (PRILOSEC) 20 MG capsule Take 20 mg by mouth daily.      . rosuvastatin  (CRESTOR) 10 MG tablet Take 10 mg by mouth daily.       No current facility-administered medications for this visit.    Past Medical History  Diagnosis Date  . Diabetes mellitus     diet controlled  . Hypertension   . Dyslipidemia   . Sleep apnea     5 yrs --cpap  does not use   . Heart murmur   . Hypothyroidism   . GERD (gastroesophageal reflux disease)   . H/O hiatal hernia   . Arthritis   . Depression     Past Surgical History  Procedure Laterality Date  . Breast tumor      secondary to fibrocystic disese  . Cholecystctomy    . Left hip replacement    . Abdominal hysterectomy    . Cardiac catheterization      normal dr. Fredrich Birks  . Joint replacement      left hip  . Cholecystectomy    . Total shoulder arthroplasty  11/15/2011    Procedure: TOTAL SHOULDER ARTHROPLASTY;  Surgeon: Senaida Lange, MD;  Location: [facility] OR;  Service: Orthopedics;  Laterality: Left;  left total shoulder arthroplasty    Family History  Problem Relation Age of Onset  . Anesthesia problems Neg Hx   . Hypotension Neg Hx   . Malignant hyperthermia Neg Hx   . Pseudochol deficiency Neg Hx     History   Social History  . Marital Status: Married    Spouse Name: N/A    Number of Children: N/A  . Years of Education: N/A   Occupational History  . Not on file.   Social History Main Topics  . Smoking status: Former Smoker    Quit date: 08/06/1990  . Smokeless tobacco: Never Used  . Alcohol Use: No  . Drug Use: No  . Sexual Activity: No   Other Topics Concern  . Not on file   Social History Narrative   Married   2 children   Retired from [employer] , no longer works. She does not use alcohol or tobacco    Review of Systems:  All systems reviewed.  They are negative to the above problem except as previously stated.  Vital Signs: BP 157/63  Pulse 79  Ht 5' 3.5" (1.613 m)  Wt 194 lb (87.998 kg)  BMI 33.82 kg/m2  Physical Exam Patient is in NAD HEENT:  Normocephalic, atraumatic. EOMI,  PERRLA.  Neck: JVP is normal.  No bruits.  Lungs: clear to auscultation. No rales no wheezes.  Heart: Regular rate and rhythm. Normal S1, S2. No S3.   No significant murmurs. PMI not displaced.  Abdomen:  Supple, nontender. Normal bowel sounds. No masses. No hepatomegaly.  Extremities:   Good distal pulses throughout. No lower extremity edema.  Musculoskeletal :moving all extremities.  Neuro:   alert and oriented x3.  CN II-XII grossly intact.  EKG:  SR 79  Nonspecific ST T wave changes Assessment and Plan:  1.  CAD No symptoms to sugg ischemia    2.  HTN BP is high here in clinic   On my check 172/72  I would reocmm that she take log of BP at home  Then take it and cuff to Dr Michaelle Birks office to see if cuff is accurate.  I will not make changes today  3.  HL  Will get labs from Dr Kathryne Sharper office.

## 2013-04-22 DIAGNOSIS — H01009 Unspecified blepharitis unspecified eye, unspecified eyelid: Secondary | ICD-10-CM | POA: Diagnosis not present

## 2013-04-22 DIAGNOSIS — G43109 Migraine with aura, not intractable, without status migrainosus: Secondary | ICD-10-CM | POA: Insufficient documentation

## 2013-04-22 DIAGNOSIS — G43809 Other migraine, not intractable, without status migrainosus: Secondary | ICD-10-CM | POA: Diagnosis not present

## 2013-04-22 DIAGNOSIS — H269 Unspecified cataract: Secondary | ICD-10-CM | POA: Diagnosis not present

## 2013-04-28 DIAGNOSIS — M5137 Other intervertebral disc degeneration, lumbosacral region: Secondary | ICD-10-CM | POA: Diagnosis not present

## 2013-04-29 ENCOUNTER — Other Ambulatory Visit: Payer: Self-pay | Admitting: *Deleted

## 2013-04-29 DIAGNOSIS — E7800 Pure hypercholesterolemia, unspecified: Secondary | ICD-10-CM

## 2013-04-29 MED ORDER — ROSUVASTATIN CALCIUM 20 MG PO TABS: 20.0000 mg | ORAL_TABLET | Freq: Every day | ORAL | Status: DC

## 2013-05-01 ENCOUNTER — Emergency Department (HOSPITAL_COMMUNITY)
Admission: EM | Admit: 2013-05-01 | Discharge: 2013-05-01 | Disposition: A | Discharge status: Home / Self Care | Payer: Medicare Other | Attending: Emergency Medicine | Admitting: Emergency Medicine

## 2013-05-01 ENCOUNTER — Emergency Department (HOSPITAL_COMMUNITY): Payer: Medicare Other

## 2013-05-01 ENCOUNTER — Encounter (HOSPITAL_COMMUNITY): Payer: Self-pay | Admitting: Emergency Medicine

## 2013-05-01 DIAGNOSIS — K219 Gastro-esophageal reflux disease without esophagitis: Secondary | ICD-10-CM | POA: Diagnosis not present

## 2013-05-01 DIAGNOSIS — F29 Unspecified psychosis not due to a substance or known physiological condition: Secondary | ICD-10-CM | POA: Insufficient documentation

## 2013-05-01 DIAGNOSIS — M129 Arthropathy, unspecified: Secondary | ICD-10-CM | POA: Diagnosis not present

## 2013-05-01 DIAGNOSIS — R42 Dizziness and giddiness: Secondary | ICD-10-CM | POA: Insufficient documentation

## 2013-05-01 DIAGNOSIS — Z79899 Other long term (current) drug therapy: Secondary | ICD-10-CM | POA: Insufficient documentation

## 2013-05-01 DIAGNOSIS — R011 Cardiac murmur, unspecified: Secondary | ICD-10-CM | POA: Diagnosis not present

## 2013-05-01 DIAGNOSIS — Z87891 Personal history of nicotine dependence: Secondary | ICD-10-CM | POA: Diagnosis not present

## 2013-05-01 DIAGNOSIS — S199XXA Unspecified injury of neck, initial encounter: Secondary | ICD-10-CM | POA: Diagnosis not present

## 2013-05-01 DIAGNOSIS — S0993XA Unspecified injury of face, initial encounter: Secondary | ICD-10-CM | POA: Insufficient documentation

## 2013-05-01 DIAGNOSIS — Y9389 Activity, other specified: Secondary | ICD-10-CM | POA: Insufficient documentation

## 2013-05-01 DIAGNOSIS — R404 Transient alteration of awareness: Secondary | ICD-10-CM | POA: Diagnosis not present

## 2013-05-01 DIAGNOSIS — E039 Hypothyroidism, unspecified: Secondary | ICD-10-CM | POA: Insufficient documentation

## 2013-05-01 DIAGNOSIS — G4730 Sleep apnea, unspecified: Secondary | ICD-10-CM | POA: Insufficient documentation

## 2013-05-01 DIAGNOSIS — S0990XA Unspecified injury of head, initial encounter: Secondary | ICD-10-CM | POA: Diagnosis not present

## 2013-05-01 DIAGNOSIS — E119 Type 2 diabetes mellitus without complications: Secondary | ICD-10-CM | POA: Diagnosis not present

## 2013-05-01 DIAGNOSIS — Z9861 Coronary angioplasty status: Secondary | ICD-10-CM | POA: Diagnosis not present

## 2013-05-01 DIAGNOSIS — I10 Essential (primary) hypertension: Secondary | ICD-10-CM | POA: Insufficient documentation

## 2013-05-01 DIAGNOSIS — Y929 Unspecified place or not applicable: Secondary | ICD-10-CM | POA: Insufficient documentation

## 2013-05-01 DIAGNOSIS — W19XXXA Unspecified fall, initial encounter: Secondary | ICD-10-CM | POA: Insufficient documentation

## 2013-05-01 DIAGNOSIS — E785 Hyperlipidemia, unspecified: Secondary | ICD-10-CM | POA: Insufficient documentation

## 2013-05-01 DIAGNOSIS — F329 Major depressive disorder, single episode, unspecified: Secondary | ICD-10-CM | POA: Diagnosis not present

## 2013-05-01 DIAGNOSIS — F3289 Other specified depressive episodes: Secondary | ICD-10-CM | POA: Insufficient documentation

## 2013-05-01 LAB — CBC WITH DIFFERENTIAL/PLATELET
Basophils Relative: 0 % (ref 0–1)
Eosinophils Absolute: 0.2 10*3/uL (ref 0.0–0.7)
Eosinophils Relative: 2 % (ref 0–5)
Hemoglobin: 14.8 g/dL (ref 12.0–15.0)
MCHC: 33.7 g/dL (ref 30.0–36.0)
Monocytes Relative: 6 % (ref 3–12)
Neutro Abs: 7.3 10*3/uL (ref 1.7–7.7)
Neutrophils Relative %: 72 % (ref 43–77)
Platelets: 243 10*3/uL (ref 150–400)
RBC: 4.72 MIL/uL (ref 3.87–5.11)

## 2013-05-01 LAB — COMPREHENSIVE METABOLIC PANEL
ALT: 29 U/L (ref 0–35)
AST: 24 U/L (ref 0–37)
Albumin: 4.3 g/dL (ref 3.5–5.2)
Alkaline Phosphatase: 88 U/L (ref 39–117)
BUN: 16 mg/dL (ref 6–23)
CO2: 26 mEq/L (ref 19–32)
Chloride: 103 mEq/L (ref 96–112)
GFR calc non Af Amer: 89 mL/min — ABNORMAL LOW (ref >90)
Potassium: 4 mEq/L (ref 3.5–5.1)
Sodium: 141 mEq/L (ref 135–145)
Total Bilirubin: 0.3 mg/dL (ref 0.3–1.2)
Total Protein: 7.4 g/dL (ref 6.0–8.3)

## 2013-05-01 LAB — URINALYSIS, ROUTINE W REFLEX MICROSCOPIC
Bilirubin Urine: NEGATIVE
Glucose, UA: NEGATIVE mg/dL
Hgb urine dipstick: NEGATIVE
Specific Gravity, Urine: 1.018 (ref 1.005–1.030)
pH: 5 (ref 5.0–8.0)

## 2013-05-01 LAB — PROTIME-INR: Prothrombin Time: 12.8 seconds (ref 11.6–15.2)

## 2013-05-01 LAB — APTT: aPTT: 29 seconds (ref 24–37)

## 2013-05-01 LAB — URINE MICROSCOPIC-ADD ON

## 2013-05-01 NOTE — ED Notes (Signed)
Pt arrived from home by [facility] with c/o confusion and disorientation after fall this morning. VS WNL. Pt stated she just feels confused and c/o neck pain.

## 2013-05-01 NOTE — ED Notes (Signed)
Pt comfortable with d/c and f/u instructions. No prescriptions 

## 2013-05-01 NOTE — ED Provider Notes (Signed)
CSN: [id number]     Arrival date & time 05/01/13  1035 History   First MD Initiated Contact with Patient 05/01/13 1040     Chief Complaint  Patient presents with  . Altered Mental Status   (Consider location/radiation/quality/duration/timing/severity/associated sxs/prior Treatment) HPI Patient is a 77 year old female who states that she has been getting lightheaded here recently. The lightheadedness is worse when standing. She states that she got up this morning to go the bathroom, became lightheaded and fell. She does not remember the incident completely but thinks that she struck her head and neck. She complains of posterior scalp pain and posterior neck pain. She states that she was mildly confused and could not remember that her husband was dead. She denies any vision changes, focal weakness or numbness. She's had no recent illness living cough, fevers, chills, nausea, vomiting, and diarrhea, urinary symptoms. She denies chest pain or shortness of breath at any point. She is feeling much better now. He's had no recent medication changes. Past Medical History  Diagnosis Date  . Diabetes mellitus     diet controlled  . Hypertension   . Dyslipidemia   . Sleep apnea     5 yrs --cpap  does not use   . Heart murmur   . Hypothyroidism   . GERD (gastroesophageal reflux disease)   . H/O hiatal hernia   . Arthritis   . Depression    Past Surgical History  Procedure Laterality Date  . Breast tumor      secondary to fibrocystic disese  . Cholecystctomy    . Left hip replacement    . Abdominal hysterectomy    . Cardiac catheterization      normal dr. Fredrich Birks  . Joint replacement      left hip  . Cholecystectomy    . Total shoulder arthroplasty  11/15/2011    Procedure: TOTAL SHOULDER ARTHROPLASTY;  Surgeon: Senaida Lange, MD;  Location: [facility] OR;  Service: Orthopedics;  Laterality: Left;  left total shoulder arthroplasty   Family History  Problem Relation Age of Onset  . Anesthesia  problems Neg Hx   . Hypotension Neg Hx   . Malignant hyperthermia Neg Hx   . Pseudochol deficiency Neg Hx    History  Substance Use Topics  . Smoking status: Former Smoker    Quit date: 08/06/1990  . Smokeless tobacco: Never Used  . Alcohol Use: No   OB History   Grav Para Term Preterm Abortions TAB SAB Ect Mult Living                 Review of Systems  Constitutional: Negative for fever and chills.  HENT: Positive for neck pain. Negative for neck stiffness.   Eyes: Negative for photophobia and visual disturbance.  Respiratory: Negative for shortness of breath.   Cardiovascular: Negative for chest pain.  Gastrointestinal: Negative for nausea, vomiting and abdominal pain.  Genitourinary: Negative for dysuria and frequency.  Musculoskeletal: Negative for back pain.  Skin: Negative for rash and wound.  Neurological: Positive for dizziness, light-headedness and headaches. Negative for weakness and numbness.  Psychiatric/Behavioral: Positive for confusion.  All other systems reviewed and are negative.    Allergies  Tetanus toxoids and Iohexol  Home Medications   Current Outpatient Rx  Name  Route  Sig  Dispense  Refill  . acyclovir (ZOVIRAX) 800 MG tablet   Oral   Take 800 mg by mouth as needed. outbreaks         . albuterol (  PROVENTIL HFA;VENTOLIN HFA) 108 (90 BASE) MCG/ACT inhaler   Inhalation   Inhale 2 puffs into the lungs every 6 (six) hours as needed for wheezing.         Marland Kitchen ALPRAZolam (XANAX) 0.25 MG tablet   Oral   Take 0.25 mg by mouth daily as needed. Anxiety As needed         . amLODipine-olmesartan (AZOR) 5-40 MG per tablet   Oral   Take 1 tablet by mouth daily.         Marland Kitchen buPROPion (WELLBUTRIN SR) 150 MG 12 hr tablet   Oral   Take 150 mg by mouth daily. 1 tab daily         . diltiazem (CARDIZEM CD) 180 MG 24 hr capsule   Oral   Take 180 mg by mouth daily. 1 tab daily         . DULoxetine (CYMBALTA) 30 MG capsule   Oral   Take 30 mg  by mouth 3 (three) times daily.          Marland Kitchen HYDROcodone-acetaminophen (NORCO/VICODIN) 5-325 MG per tablet   Oral   Take 1 tablet by mouth every 6 (six) hours as needed for pain. As needed         . levothyroxine (SYNTHROID, LEVOTHROID) 175 MCG tablet   Oral   Take 175 mcg by mouth daily.         Marland Kitchen MAGNESIUM PO   Oral   Take 1 tablet by mouth daily.         . meclizine (ANTIVERT) 25 MG tablet   Oral   Take 25 mg by mouth 3 (three) times daily as needed for dizziness. For dizziness         . modafinil (PROVIGIL) 200 MG tablet   Oral   Take 200 mg by mouth daily. As needed         . omeprazole (PRILOSEC) 20 MG capsule   Oral   Take 20 mg by mouth daily.         . rosuvastatin (CRESTOR) 20 MG tablet   Oral   Take 1 tablet (20 mg total) by mouth daily.   30 tablet   12    BP 186/69  Pulse 82  Temp(Src) 98.6 F (37 C) (Oral)  Resp 16  SpO2 96% Physical Exam  Nursing note and vitals reviewed. Constitutional: She is oriented to person, place, and time. She appears well-developed and well-nourished. No distress.  HENT:  Head: Normocephalic and atraumatic.  Mouth/Throat: Oropharynx is clear and moist.  Mild posterior scalp tenderness on the right without evidence of hematoma, deformity or injury.  Eyes: EOM are normal. Pupils are equal, round, and reactive to light.  Neck:  C-collar in place.  Cardiovascular: Normal rate and regular rhythm.   Pulmonary/Chest: Effort normal and breath sounds normal. No respiratory distress. She has no wheezes. She has no rales.  Abdominal: Soft. Bowel sounds are normal. She exhibits no distension and no mass. There is no tenderness. There is no rebound and no guarding.  Musculoskeletal: Normal range of motion. She exhibits no edema and no tenderness.  No thoracic or lumbar spinal tenderness.  Neurological: She is alert and oriented to person, place, and time.  Patient is alert and oriented x3 with clear, goal oriented speech.  Patient has 5/5 motor in all extremities. Sensation is intact to light touch.    Skin: Skin is warm and dry. No rash noted. No erythema.  Psychiatric: She has  a normal mood and affect. Her behavior is normal.    ED Course  Procedures (including critical care time) Labs Review Labs Reviewed - No data to display Imaging Review No results found.  Date: 05/01/2013  Rate: 77  Rhythm: normal sinus rhythm  QRS Axis: normal  Intervals: normal  ST/T Wave abnormalities: normal  Conduction Disutrbances:none  Narrative Interpretation:   Old EKG Reviewed: unchanged   MDM   Patient states she is now feeling better. She would like to be discharged home. Workup has been negative thus far. She is ambulating well to the bathroom and back. Her neurologic exam remains intact. Question whether her lightheadedness is medication related. Advised her to change positions very slowly. She is feeling lightheaded she needs to sit down. Patient advised to record her blood pressures at home and follow with her primary Dr. for medication adjustment. Return precautions have been given.   Loren Racer, MD 05/01/13 [phone]

## 2013-05-05 DIAGNOSIS — I10 Essential (primary) hypertension: Secondary | ICD-10-CM | POA: Diagnosis not present

## 2013-05-05 DIAGNOSIS — E039 Hypothyroidism, unspecified: Secondary | ICD-10-CM | POA: Diagnosis not present

## 2013-05-05 DIAGNOSIS — Z79899 Other long term (current) drug therapy: Secondary | ICD-10-CM | POA: Diagnosis not present

## 2013-05-12 ENCOUNTER — Other Ambulatory Visit: Payer: Self-pay | Admitting: Internal Medicine

## 2013-05-12 DIAGNOSIS — R42 Dizziness and giddiness: Secondary | ICD-10-CM

## 2013-05-12 DIAGNOSIS — I2510 Atherosclerotic heart disease of native coronary artery without angina pectoris: Secondary | ICD-10-CM

## 2013-05-12 DIAGNOSIS — R0989 Other specified symptoms and signs involving the circulatory and respiratory systems: Secondary | ICD-10-CM

## 2013-05-14 ENCOUNTER — Ambulatory Visit
Admission: RE | Admit: 2013-05-14 | Discharge: 2013-05-14 | Disposition: A | Discharge status: Home / Self Care | Payer: Medicare Other | Source: Ambulatory Visit | Attending: Internal Medicine | Admitting: Internal Medicine

## 2013-05-14 DIAGNOSIS — R42 Dizziness and giddiness: Secondary | ICD-10-CM

## 2013-05-14 DIAGNOSIS — I2510 Atherosclerotic heart disease of native coronary artery without angina pectoris: Secondary | ICD-10-CM

## 2013-05-14 DIAGNOSIS — I658 Occlusion and stenosis of other precerebral arteries: Secondary | ICD-10-CM | POA: Diagnosis not present

## 2013-05-14 DIAGNOSIS — R0989 Other specified symptoms and signs involving the circulatory and respiratory systems: Secondary | ICD-10-CM

## 2013-05-18 ENCOUNTER — Other Ambulatory Visit: Payer: Self-pay | Admitting: *Deleted

## 2013-05-18 DIAGNOSIS — I10 Essential (primary) hypertension: Secondary | ICD-10-CM

## 2013-05-21 DIAGNOSIS — I10 Essential (primary) hypertension: Secondary | ICD-10-CM | POA: Diagnosis not present

## 2013-05-21 DIAGNOSIS — Z23 Encounter for immunization: Secondary | ICD-10-CM | POA: Diagnosis not present

## 2013-06-15 ENCOUNTER — Encounter: Payer: Self-pay | Admitting: Vascular Surgery

## 2013-06-16 ENCOUNTER — Other Ambulatory Visit (HOSPITAL_COMMUNITY): Payer: Medicare Other

## 2013-06-16 ENCOUNTER — Encounter: Payer: Medicare Other | Admitting: Vascular Surgery

## 2013-07-09 DIAGNOSIS — Z87891 Personal history of nicotine dependence: Secondary | ICD-10-CM | POA: Diagnosis not present

## 2013-07-09 DIAGNOSIS — F339 Major depressive disorder, recurrent, unspecified: Secondary | ICD-10-CM | POA: Diagnosis not present

## 2013-07-15 DIAGNOSIS — M5137 Other intervertebral disc degeneration, lumbosacral region: Secondary | ICD-10-CM | POA: Diagnosis not present

## 2013-07-20 ENCOUNTER — Encounter: Payer: Self-pay | Admitting: Internal Medicine

## 2013-07-20 DIAGNOSIS — I10 Essential (primary) hypertension: Secondary | ICD-10-CM | POA: Insufficient documentation

## 2013-07-20 NOTE — Progress Notes (Signed)
Patient ID: [name], female   DOB: [birth year], 77 y.o.   MRN: [id number]   This very nice 77 yo WWF presents for 3 month follow up with Hypertension, Hyperlipidemia, GERD, Pre-Diabetes and Vitamin D Deficiency. Patient was Hx/o GERD and has been on OTC omeprazole 20 mg. She is post appendectomy and cholecystectomy and recently has been experiencing some nausea w/o emesis or abdominal pain or cramps.   BP has been controlled at home. Today's BP is 142/80. Patient denies any cardiac type chest pain, palpitations, dyspnea/orthopnea/PND, dizziness, claudication, or dependent edema.   Hyperlipidemia is controlled with diet & meds. Last Cholesterol was  188, Triglycerides were 251, HDL 46 and LDL 92 in August . Patient denies myalgias or other med SE's.    Also, the patient has history of PreDiabetes/insulin resistance with last A1c of  5.8% (was 6.0% in March). Patient denies any symptoms of reactive hypoglycemia, diabetic polys, paresthesias or visual blurring.   Further, Patient has history of Vitamin D Deficiency with last vitamin D of 60. Patient supplements vitamin D without any suspected side-effects.  Current Outpatient Prescriptions on File Prior to Visit  Medication Sig Dispense Refill  . acyclovir (ZOVIRAX) 800 MG tablet Take 800 mg by mouth as needed. outbreaks      . albuterol (PROVENTIL HFA;VENTOLIN HFA) 108 (90 BASE) MCG/ACT inhaler Inhale 2 puffs into the lungs every 6 (six) hours as needed for wheezing.      Marland Kitchen ALPRAZolam (XANAX) 0.25 MG tablet Take 0.25 mg by mouth daily as needed. Anxiety As needed      . amLODipine-olmesartan (AZOR) 5-40 MG per tablet Take 1 tablet by mouth daily.      Marland Kitchen buPROPion (WELLBUTRIN SR) 150 MG 12 hr tablet Take 150 mg by mouth daily. 1 tab daily      . diltiazem (CARDIZEM CD) 180 MG 24 hr capsule Take 180 mg by mouth daily. 1 tab daily      . DULoxetine (CYMBALTA) 30 MG capsule Take 30 mg by mouth 3 (three) times daily.       Marland Kitchen  HYDROcodone-acetaminophen (NORCO/VICODIN) 5-325 MG per tablet Take 1 tablet by mouth every 6 (six) hours as needed for pain. As needed      . levothyroxine (SYNTHROID, LEVOTHROID) 175 MCG tablet Take 175 mcg by mouth daily.      Marland Kitchen MAGNESIUM PO Take 1 tablet by mouth daily.      . meclizine (ANTIVERT) 25 MG tablet Take 25 mg by mouth 3 (three) times daily as needed for dizziness. For dizziness      . modafinil (PROVIGIL) 200 MG tablet Take 200 mg by mouth daily. As needed      . omeprazole (PRILOSEC) 20 MG capsule Take 20 mg by mouth daily.      . rosuvastatin (CRESTOR) 20 MG tablet Take 1 tablet (20 mg total) by mouth daily.  30 tablet  12   No current facility-administered medications on file prior to visit.     Allergies  Allergen Reactions  . Tetanus Toxoids Anaphylaxis  . Iohexol      Desc: THROAT SWELLED/ PROBLEMS BREATHING WITH IVP DYE 10 PLUS YEARS AGO     PMHx:   Past Medical History  Diagnosis Date  . Dyslipidemia   . Sleep apnea     5 yrs --cpap  does not use   . Heart murmur   . H/O hiatal hernia   . Arthritis   . Hyperlipidemia   . Hypertension   .  GERD (gastroesophageal reflux disease)   . Depression   . Diabetes mellitus     diet controlled  . Hypothyroidism     FHx:    Reviewed / unchanged  SHx:    Reviewed / unchanged  Systems Review: Constitutional: Denies fever, chills, wt changes, headaches, insomnia, fatigue, night sweats, change in appetite. Eyes: Denies redness, blurred vision, diplopia, discharge, itchy, watery eyes.  ENT: Denies discharge, congestion, post nasal drip, epistaxis, sore throat, earache, hearing loss, dental pain, tinnitus, vertigo, sinus pain, snoring.  CV: Denies chest pain, palpitations, irregular heartbeat, syncope, dyspnea, diaphoresis, orthopnea, PND, claudication, edema. Respiratory: denies cough, dyspnea, DOE, pleurisy, hoarseness, laryngitis, wheezing.  Gastrointestinal: Denies dysphagia, odynophagia, heartburn, reflux,  water brash, abdominal pain or cramps, nausea, vomiting, bloating, diarrhea, constipation, hematemesis, melena, hematochezia,  or hemorrhoids. Genitourinary: Denies dysuria, frequency, urgency, nocturia, hesitancy, discharge, hematuria, flank pain. Musculoskeletal: Denies arthralgias, myalgias, stiffness, jt. swelling, pain, limp, strain/sprain.  Skin: Denies pruritus, rash, hives, warts, acne, eczema, change in skin lesion(s). Neuro: No weakness, tremor, incoordination, spasms, paresthesia, or pain. Psychiatric: Denies confusion, memory loss, or sensory loss. Endo: Denies change in weight, skin, hair change.  Heme/Lymph: No excessive bleeding, bruising, orenlarged lymph nodes.  Filed Vitals:   07/21/13 0912  BP: 142/80  Pulse: 64  Temp: 97.5 F (36.4 C)  Resp: 18    Estimated body mass index is 33.33 kg/(m^2) as calculated from the following:   Height as of 04/20/13: 5' 3.5" (1.613 m).   Weight as of this encounter: 191 lb 3.2 oz (86.728 kg).  On Exam: Appears well nourished - in no distress. Eyes: PERRLA, EOMs, conjunctiva no swelling or erythema. Sinuses: No frontal/maxillary tenderness ENT/Mouth: EAC's clear, TM's nl w/o erythema, bulging. Nares clear w/o erythema, swelling, exudates. Oropharynx clear without erythema or exudates. Oral hygiene is good. Tongue normal, non obstructing. Hearing intact.  Neck: Supple. Thyroid nl. Car 2+/2+ without bruits, nodes or JVD. Chest: Respirations nl with BS clear & equal w/o rales, rhonchi, wheezing or stridor.  Cor: Heart sounds normal w/ regular rate and rhythm without sig. murmurs, gallops, clicks, or rubs. Peripheral pulses normal and equal  without edema.  Abdomen: Soft & bowel sounds normal. Non-tender w/o guarding, rebound, hernias, masses, or organomegaly.  Lymphatics: Unremarkable.  Musculoskeletal: Full ROM all peripheral extremities, joint stability, 5/5 strength, and normal gait.  Skin: Warm, dry without exposed rashes, lesions,  ecchymosis apparent.  Neuro: Cranial nerves intact, reflexes equal bilaterally. Sensory-motor testing grossly intact. Tendon reflexes grossly intact.  Pysch: Alert & oriented x 3. Insight and judgement nl & appropriate. No ideations.  Assessment and Plan:  1. Hypertension - Continue monitor blood pressure at home. Continue diet/meds same.  2. Hyperlipidemia - Continue diet/meds, exercise,& lifestyle modifications. Continue monitor periodic cholesterol/liver & renal functions   3. Pre-diabetes/Insulin Resistance - Continue diet, exercise, lifestyle modifications. Monitor appropriate labs.  4. Vitamin D Deficiency - Continue supplementation.  5. GERD - recent increase Nausea - will empirically increase omeprazole to 40 mg qd and try on compazine tid and monitor clinical response.  Recommended regular exercise, BP monitoring, weight control, and discussed med and SE's. Recommended labs to assess and monitor clinical status. Further disposition pending results of labs.

## 2013-07-21 ENCOUNTER — Encounter: Payer: Self-pay | Admitting: Internal Medicine

## 2013-07-21 ENCOUNTER — Ambulatory Visit (INDEPENDENT_AMBULATORY_CARE_PROVIDER_SITE_OTHER): Payer: Medicare Other | Admitting: Internal Medicine

## 2013-07-21 VITALS — BP 142/80 | HR 64 | Temp 97.5°F | Resp 18 | Wt 191.2 lb

## 2013-07-21 DIAGNOSIS — E782 Mixed hyperlipidemia: Secondary | ICD-10-CM

## 2013-07-21 DIAGNOSIS — I10 Essential (primary) hypertension: Secondary | ICD-10-CM | POA: Diagnosis not present

## 2013-07-21 DIAGNOSIS — Z79899 Other long term (current) drug therapy: Secondary | ICD-10-CM | POA: Diagnosis not present

## 2013-07-21 DIAGNOSIS — E559 Vitamin D deficiency, unspecified: Secondary | ICD-10-CM

## 2013-07-21 DIAGNOSIS — R7309 Other abnormal glucose: Secondary | ICD-10-CM | POA: Diagnosis not present

## 2013-07-21 LAB — CBC WITH DIFFERENTIAL/PLATELET
Basophils Absolute: 0 10*3/uL (ref 0.0–0.1)
Basophils Relative: 1 % (ref 0–1)
Eosinophils Absolute: 0.2 10*3/uL (ref 0.0–0.7)
Hemoglobin: 13 g/dL (ref 12.0–15.0)
Lymphocytes Relative: 25 % (ref 12–46)
MCH: 31.3 pg (ref 26.0–34.0)
MCHC: 33.4 g/dL (ref 30.0–36.0)
Monocytes Absolute: 0.5 10*3/uL (ref 0.1–1.0)
Monocytes Relative: 8 % (ref 3–12)
Neutro Abs: 4.5 10*3/uL (ref 1.7–7.7)
Neutrophils Relative %: 64 % (ref 43–77)
RDW: 14.1 % (ref 11.5–15.5)

## 2013-07-21 MED ORDER — OMEPRAZOLE 40 MG PO CPDR: 40.0000 mg | DELAYED_RELEASE_CAPSULE | Freq: Every day | ORAL | Status: DC

## 2013-07-21 MED ORDER — PROCHLORPERAZINE MALEATE 5 MG PO TABS: ORAL_TABLET | ORAL | Status: DC

## 2013-07-21 NOTE — Patient Instructions (Addendum)
Continue diet & medications same as discussed.   Further disposition pending lab results.   Gastroesophageal Reflux Disease, Adult Gastroesophageal reflux disease (GERD) happens when acid from your stomach flows up into the esophagus. When acid comes in contact with the esophagus, the acid causes soreness (inflammation) in the esophagus. Over time, GERD may create small holes (ulcers) in the lining of the esophagus. CAUSES   Increased body weight. This puts pressure on the stomach, making acid rise from the stomach into the esophagus.  Smoking. This increases acid production in the stomach.  Drinking alcohol. This causes decreased pressure in the lower esophageal sphincter (valve or ring of muscle between the esophagus and stomach), allowing acid from the stomach into the esophagus.  Late evening meals and a full stomach. This increases pressure and acid production in the stomach.  A malformed lower esophageal sphincter. Sometimes, no cause is found. SYMPTOMS   Burning pain in the lower part of the mid-chest behind the breastbone and in the mid-stomach area. This may occur twice a week or more often.  Trouble swallowing.  Sore throat.  Dry cough.  Asthma-like symptoms including chest tightness, shortness of breath, or wheezing. DIAGNOSIS  Your caregiver may be able to diagnose GERD based on your symptoms. In some cases, X-rays and other tests may be done to check for complications or to check the condition of your stomach and esophagus. TREATMENT  Your caregiver may recommend over-the-counter or prescription medicines to help decrease acid production. Ask your caregiver before starting or adding any new medicines.  HOME CARE INSTRUCTIONS   Change the factors that you can control. Ask your caregiver for guidance concerning weight loss, quitting smoking, and alcohol consumption.  Avoid foods and drinks that make your symptoms worse, such as:  Caffeine or alcoholic  drinks.  Chocolate.  Peppermint or mint flavorings.  Garlic and onions.  Spicy foods.  Citrus fruits, such as oranges, lemons, or limes.  Tomato-based foods such as sauce, chili, salsa, and pizza.  Fried and fatty foods.  Avoid lying down for the 3 hours prior to your bedtime or prior to taking a nap.  Eat small, frequent meals instead of large meals.  Wear loose-fitting clothing. Do not wear anything tight around your waist that causes pressure on your stomach.  Raise the head of your bed 6 to 8 inches with wood blocks to help you sleep. Extra pillows will not help.  Only take over-the-counter or prescription medicines for pain, discomfort, or fever as directed by your caregiver.  Do not take aspirin, ibuprofen, or other nonsteroidal anti-inflammatory drugs (NSAIDs). SEEK IMMEDIATE MEDICAL CARE IF:   You have pain in your arms, neck, jaw, teeth, or back.  Your pain increases or changes in intensity or duration.  You develop nausea, vomiting, or sweating (diaphoresis).  You develop shortness of breath, or you faint.  Your vomit is green, yellow, black, or looks like coffee grounds or blood.  Your stool is red, bloody, or black. These symptoms could be signs of other problems, such as heart disease, gastric bleeding, or esophageal bleeding. MAKE SURE YOU:   Understand these instructions.  Will watch your condition.  Will get help right away if you are not doing well or get worse. Document Released: 05/02/2005 Document Revised: 10/15/2011 Document Reviewed: 02/09/2011 [facility] Patient Information 2014 [place], [place].  Hypertension As your heart beats, it forces blood through your arteries. This force is your blood pressure. If the pressure is too high, it is called hypertension (HTN)  or high blood pressure. HTN is dangerous because you may have it and not know it. High blood pressure may mean that your heart has to work harder to pump blood. Your arteries may be  narrow or stiff. The extra work puts you at risk for heart disease, stroke, and other problems.  Blood pressure consists of two numbers, a higher number over a lower, 110/72, for example. It is stated as "110 over 72." The ideal is below 120 for the top number (systolic) and under 80 for the bottom (diastolic). Write down your blood pressure today. You should pay close attention to your blood pressure if you have certain conditions such as:  Heart failure.  Prior heart attack.  Diabetes  Chronic kidney disease.  Prior stroke.  Multiple risk factors for heart disease. To see if you have HTN, your blood pressure should be measured while you are seated with your arm held at the level of the heart. It should be measured at least twice. A one-time elevated blood pressure reading (especially in the Emergency Department) does not mean that you need treatment. There may be conditions in which the blood pressure is different between your right and left arms. It is important to see your caregiver soon for a recheck. Most people have essential hypertension which means that there is not a specific cause. This type of high blood pressure may be lowered by changing lifestyle factors such as:  Stress.  Smoking.  Lack of exercise.  Excessive weight.  Drug/tobacco/alcohol use.  Eating less salt. Most people do not have symptoms from high blood pressure until it has caused damage to the body. Effective treatment can often prevent, delay or reduce that damage. TREATMENT  When a cause has been identified, treatment for high blood pressure is directed at the cause. There are a large number of medications to treat HTN. These fall into several categories, and your caregiver will help you select the medicines that are best for you. Medications may have side effects. You should review side effects with your caregiver. If your blood pressure stays high after you have made lifestyle changes or started on  medicines,   Your medication(s) may need to be changed.  Other problems may need to be addressed.  Be certain you understand your prescriptions, and know how and when to take your medicine.  Be sure to follow up with your caregiver within the time frame advised (usually within two weeks) to have your blood pressure rechecked and to review your medications.  If you are taking more than one medicine to lower your blood pressure, make sure you know how and at what times they should be taken. Taking two medicines at the same time can result in blood pressure that is too low. SEEK IMMEDIATE MEDICAL CARE IF:  You develop a severe headache, blurred or changing vision, or confusion.  You have unusual weakness or numbness, or a faint feeling.  You have severe chest or abdominal pain, vomiting, or breathing problems. MAKE SURE YOU:   Understand these instructions.  Will watch your condition.  Will get help right away if you are not doing well or get worse. Document Released: 07/23/2005 Document Revised: 10/15/2011 Document Reviewed: 03/12/2008 [facility] Patient Information 2014 [place], [place].  Diabetes and Exercise Exercising regularly is important. It is not just about losing weight. It has many health benefits, such as:  Improving your overall fitness, flexibility, and endurance.  Increasing your bone density.  Helping with weight control.  Decreasing your  body fat.  Increasing your muscle strength.  Reducing stress and tension.  Improving your overall health. People with diabetes who exercise gain additional benefits because exercise:  Reduces appetite.  Improves the body's use of blood sugar (glucose).  Helps lower or control blood glucose.  Decreases blood pressure.  Helps control blood lipids (such as cholesterol and triglycerides).  Improves the body's use of the hormone insulin by:  Increasing the body's insulin sensitivity.  Reducing the body's insulin  needs.  Decreases the risk for heart disease because exercising:  Lowers cholesterol and triglycerides levels.  Increases the levels of good cholesterol (such as high-density lipoproteins [HDL]) in the body.  Lowers blood glucose levels. YOUR ACTIVITY PLAN  Choose an activity that you enjoy and set realistic goals. Your health care provider or diabetes educator can help you make an activity plan that works for you. You can break activities into 2 or 3 sessions throughout the day. Doing so is as good as one long session. Exercise ideas include:  Taking the dog for a walk.  Taking the stairs instead of the elevator.  Dancing to your favorite song.  Doing your favorite exercise with a friend. RECOMMENDATIONS FOR EXERCISING WITH TYPE 1 OR TYPE 2 DIABETES   Check your blood glucose before exercising. If blood glucose levels are greater than 240 mg/dL, check for urine ketones. Do not exercise if ketones are present.  Avoid injecting insulin into areas of the body that are going to be exercised. For example, avoid injecting insulin into:  The arms when playing tennis.  The legs when jogging.  Keep a record of:  Food intake before and after you exercise.  Expected peak times of insulin action.  Blood glucose levels before and after you exercise.  The type and amount of exercise you have done.  Review your records with your health care provider. Your health care provider will help you to develop guidelines for adjusting food intake and insulin amounts before and after exercising.  If you take insulin or oral hypoglycemic agents, watch for signs and symptoms of hypoglycemia. They include:  Dizziness.  Shaking.  Sweating.  Chills.  Confusion.  Drink plenty of water while you exercise to prevent dehydration or heat stroke. Body water is lost during exercise and must be replaced.  Talk to your health care provider before starting an exercise program to make sure it is safe  for you. Remember, almost any type of activity is better than none. Document Released: 10/13/2003 Document Revised: 03/25/2013 Document Reviewed: 12/30/2012 [facility] Patient Information 2014 [place], [place]. Cholesterol Cholesterol is a white, waxy, fat-like protein needed by your body in small amounts. The liver makes all the cholesterol you need. It is carried from the liver by the blood through the blood vessels. Deposits (plaque) may build up on blood vessel walls. This makes the arteries narrower and stiffer. Plaque increases the risk for heart attack and stroke. You cannot feel your cholesterol level even if it is very high. The only way to know is by a blood test to check your lipid (fats) levels. Once you know your cholesterol levels, you should keep a record of the test results. Work with your caregiver to to keep your levels in the desired range. WHAT THE RESULTS MEAN:  Total cholesterol is a rough measure of all the cholesterol in your blood.  LDL is the so-called bad cholesterol. This is the type that deposits cholesterol in the walls of the arteries. You want this level to  be low.  HDL is the good cholesterol because it cleans the arteries and carries the LDL away. You want this level to be high.  Triglycerides are fat that the body can either burn for energy or store. High levels are closely linked to heart disease. DESIRED LEVELS:  Total cholesterol below 200.  LDL below 100 for people at risk, below 70 for very high risk.  HDL above 50 is good, above 60 is best.  Triglycerides below 150. HOW TO LOWER YOUR CHOLESTEROL:  Diet.  Choose fish or white meat chicken and [place], roasted or baked. Limit fatty cuts of red meat, fried foods, and processed meats, such as sausage and lunch meat.  Eat lots of fresh fruits and vegetables. Choose whole grains, beans, pasta, potatoes and cereals.  Use only small amounts of olive, corn or canola oils. Avoid butter, mayonnaise,  shortening or palm kernel oils. Avoid foods with trans-fats.  Use skim/nonfat milk and low-fat/nonfat yogurt and cheeses. Avoid whole milk, cream, ice cream, egg yolks and cheeses. Healthy desserts include angel food cake, ginger snaps, animal crackers, hard candy, popsicles, and low-fat/nonfat frozen yogurt. Avoid pastries, cakes, pies and cookies.  Exercise.  A regular program helps decrease LDL and raises HDL.  Helps with weight control.  Do things that increase your activity level like gardening, walking, or taking the stairs.  Medication.  May be prescribed by your caregiver to help lowering cholesterol and the risk for heart disease.  You may need medicine even if your levels are normal if you have several risk factors. HOME CARE INSTRUCTIONS   Follow your diet and exercise programs as suggested by your caregiver.  Take medications as directed.  Have blood work done when your caregiver feels it is necessary. MAKE SURE YOU:   Understand these instructions.  Will watch your condition.  Will get help right away if you are not doing well or get worse. Document Released: 04/17/2001 Document Revised: 10/15/2011 Document Reviewed: 10/08/2007 [facility] Patient Information 2014 [place], [place]. Vitamin D Deficiency Vitamin D is an important vitamin that your body needs. Having too little of it in your body is called a deficiency. A very bad deficiency can make your bones soft and can cause a condition called rickets.  Vitamin D is important to your body for different reasons, such as:   It helps your body absorb 2 minerals called calcium and phosphorus.  It helps make your bones healthy.  It may prevent some diseases, such as diabetes and multiple sclerosis.  It helps your muscles and heart. You can get vitamin D in several ways. It is a natural part of some foods. The vitamin is also added to some dairy products and cereals. Some people take vitamin D supplements. Also, your  body makes vitamin D when you are in the sun. It changes the sun's rays into a form of the vitamin that your body can use. CAUSES   Not eating enough foods that contain vitamin D.  Not getting enough sunlight.  Having certain digestive system diseases that make it hard to absorb vitamin D. These diseases include Crohn's disease, chronic pancreatitis, and cystic fibrosis.  Having a surgery in which part of the stomach or small intestine is removed.  Being obese. Fat cells pull vitamin D out of your blood. That means that obese people may not have enough vitamin D left in their blood and in other body tissues.  Having chronic kidney or liver disease. RISK FACTORS Risk factors are things that  make you more likely to develop a vitamin D deficiency. They include:  Being older.  Not being able to get outside very much.  Living in a nursing home.  Having had broken bones.  Having weak or thin bones (osteoporosis).  Having a disease or condition that changes how your body absorbs vitamin D.  Having dark skin.  Some medicines such as seizure medicines or steroids.  Being overweight or obese. SYMPTOMS Mild cases of vitamin D deficiency may not have any symptoms. If you have a very bad case, symptoms may include:  Bone pain.  Muscle pain.  Falling often.  Broken bones caused by a minor injury, due to osteoporosis. DIAGNOSIS A blood test is the best way to tell if you have a vitamin D deficiency. TREATMENT Vitamin D deficiency can be treated in different ways. Treatment for vitamin D deficiency depends on what is causing it. Options include:  Taking vitamin D supplements.  Taking a calcium supplement. Your caregiver will suggest what dose is best for you. HOME CARE INSTRUCTIONS  Take any supplements that your caregiver prescribes. Follow the directions carefully. Take only the suggested amount.  Have your blood tested 2 months after you start taking supplements.  Eat  foods that contain vitamin D. Healthy choices include:  Fortified dairy products, cereals, or juices. Fortified means vitamin D has been added to the food. Check the label on the package to be sure.  Fatty fish like salmon or trout.  Eggs.  Oysters.  Do not use a tanning bed.  Keep your weight at a healthy level. Lose weight if you need to.  Keep all follow-up appointments. Your caregiver will need to perform blood tests to make sure your vitamin D deficiency is going away. SEEK MEDICAL CARE IF:  You have any questions about your treatment.  You continue to have symptoms of vitamin D deficiency.  You have nausea or vomiting.  You are constipated.  You feel confused.  You have severe abdominal or back pain. MAKE SURE YOU:  Understand these instructions.  Will watch your condition.  Will get help right away if you are not doing well or get worse. Document Released: 10/15/2011 Document Revised: 11/17/2012 Document Reviewed: 10/15/2011 [facility] Patient Information 2014 [place], [place].  Diet for Gastroesophageal Reflux Disease, Adult Reflux (acid reflux) is when acid from your stomach flows up into the esophagus. When acid comes in contact with the esophagus, the acid causes irritation and soreness (inflammation) in the esophagus. When reflux happens often or so severely that it causes damage to the esophagus, it is called gastroesophageal reflux disease (GERD). Nutrition therapy can help ease the discomfort of GERD. FOODS OR DRINKS TO AVOID OR LIMIT  Smoking or chewing tobacco. Nicotine is one of the most potent stimulants to acid production in the gastrointestinal tract.  Caffeinated and decaffeinated coffee and black tea.  Regular or low-calorie carbonated beverages or energy drinks (caffeine-free carbonated beverages are allowed).   Strong spices, such as black pepper, white pepper, red pepper, cayenne, curry powder, and chili powder.  Peppermint or  spearmint.  Chocolate.  High-fat foods, including meats and fried foods. Extra added fats including oils, butter, salad dressings, and nuts. Limit these to less than 8 tsp per day.  Fruits and vegetables if they are not tolerated, such as citrus fruits or tomatoes.  Alcohol.  Any food that seems to aggravate your condition. If you have questions regarding your diet, call your caregiver or a registered dietitian. OTHER THINGS THAT MAY  HELP GERD INCLUDE:   Eating your meals slowly, in a relaxed setting.  Eating 5 to 6 small meals per day instead of 3 large meals.  Eliminating food for a period of time if it causes distress.  Not lying down until 3 hours after eating a meal.  Keeping the head of your bed raised 6 to 9 inches (15 to 23 cm) by using a foam wedge or blocks under the legs of the bed. Lying flat may make symptoms worse.  Being physically active. Weight loss may be helpful in reducing reflux in overweight or obese adults.  Wear loose fitting clothing EXAMPLE MEAL PLAN This meal plan is approximately 2,000 calories based on https://www.[facility].org/ meal planning guidelines. Breakfast   cup cooked oatmeal.  1 cup strawberries.  1 cup low-fat milk.  1 oz almonds. Snack  1 cup cucumber slices.  6 oz yogurt (made from low-fat or fat-free milk). Lunch  2 slice whole-wheat bread.  2 oz sliced [place].  2 tsp mayonnaise.  1 cup blueberries.  1 cup snap peas. Snack  6 whole-wheat crackers.  1 oz string cheese. Dinner   cup brown rice.  1 cup mixed veggies.  1 tsp olive oil.  3 oz grilled fish. Document Released: 07/23/2005 Document Revised: 10/15/2011 Document Reviewed: 06/08/2011 [facility] Patient Information 2014 [place], [place].

## 2013-07-22 LAB — BASIC METABOLIC PANEL WITH GFR
BUN: 17 mg/dL (ref 6–23)
CO2: 26 mEq/L (ref 19–32)
Calcium: 9.8 mg/dL (ref 8.4–10.5)
Chloride: 105 mEq/L (ref 96–112)
GFR, Est African American: >89 mL/min
GFR, Est Non African American: 80 mL/min
Glucose, Bld: 127 mg/dL — ABNORMAL HIGH (ref 70–99)
Potassium: 4.4 mEq/L (ref 3.5–5.3)
Sodium: 142 mEq/L (ref 135–145)

## 2013-07-22 LAB — HEPATIC FUNCTION PANEL
ALT: 14 U/L (ref 0–35)
AST: 14 U/L (ref 0–37)
Indirect Bilirubin: 0.4 mg/dL (ref 0.0–0.9)
Total Bilirubin: 0.5 mg/dL (ref 0.3–1.2)
Total Protein: 6.8 g/dL (ref 6.0–8.3)

## 2013-07-22 LAB — INSULIN, FASTING: Insulin fasting, serum: 31 u[IU]/mL — ABNORMAL HIGH (ref 3–28)

## 2013-07-22 LAB — LIPID PANEL
Cholesterol: 166 mg/dL (ref 0–200)
HDL: 46 mg/dL (ref >39)
Total CHOL/HDL Ratio: 3.6 Ratio
Triglycerides: 190 mg/dL — ABNORMAL HIGH (ref <150)

## 2013-07-22 LAB — HEMOGLOBIN A1C: Mean Plasma Glucose: 126 mg/dL — ABNORMAL HIGH (ref <117)

## 2013-07-22 LAB — MAGNESIUM: Magnesium: 1.7 mg/dL (ref 1.5–2.5)

## 2013-07-23 ENCOUNTER — Ambulatory Visit: Payer: Self-pay | Admitting: Internal Medicine

## 2013-08-20 ENCOUNTER — Telehealth: Payer: Self-pay | Admitting: *Deleted

## 2013-08-20 NOTE — Telephone Encounter (Signed)
Patient called with runny nose, upset stomach, diarrhea.  Per Dr Oneta RackMcKeown, try Dayquil and nyquil and Immodium for diarrhea. Patient aware

## 2013-08-24 ENCOUNTER — Other Ambulatory Visit: Payer: Self-pay | Admitting: Emergency Medicine

## 2013-08-24 ENCOUNTER — Encounter: Payer: Self-pay | Admitting: Emergency Medicine

## 2013-08-24 ENCOUNTER — Ambulatory Visit (INDEPENDENT_AMBULATORY_CARE_PROVIDER_SITE_OTHER): Payer: Medicare Other | Admitting: Emergency Medicine

## 2013-08-24 VITALS — BP 118/58 | HR 76 | Temp 97.8°F | Resp 16 | Ht 63.5 in | Wt 185.0 lb

## 2013-08-24 DIAGNOSIS — R059 Cough, unspecified: Secondary | ICD-10-CM

## 2013-08-24 DIAGNOSIS — R42 Dizziness and giddiness: Secondary | ICD-10-CM | POA: Diagnosis not present

## 2013-08-24 DIAGNOSIS — J309 Allergic rhinitis, unspecified: Secondary | ICD-10-CM

## 2013-08-24 DIAGNOSIS — R5381 Other malaise: Secondary | ICD-10-CM

## 2013-08-24 DIAGNOSIS — R5383 Other fatigue: Principal | ICD-10-CM

## 2013-08-24 DIAGNOSIS — R05 Cough: Secondary | ICD-10-CM

## 2013-08-24 DIAGNOSIS — E538 Deficiency of other specified B group vitamins: Secondary | ICD-10-CM | POA: Diagnosis not present

## 2013-08-24 MED ORDER — PREDNISONE 10 MG PO TABS: ORAL_TABLET | ORAL | Status: DC

## 2013-08-24 MED ORDER — AZITHROMYCIN 250 MG PO TABS: ORAL_TABLET | ORAL | Status: AC

## 2013-08-24 NOTE — Progress Notes (Signed)
Subjective:    Patient ID: [name], female    DOB: [birth year], 78 y.o.   MRN: [id number]  HPI Comments: 78 YO FEMALE with increasing cold symptoms. She feels like she has been sick since last WED. She has cough and cold symptoms with diarrhea. She is feeling more tired and dizzy. BS 70s since she has been sick, she has not been eating much since feels bad. She has been using dayquil/ mucinex which helped some but still feels like stuff deep in chest. She notes diarrhea has improved and feels like appetite is improving.   Cough   Current Outpatient Prescriptions on File Prior to Visit  Medication Sig Dispense Refill  . acyclovir (ZOVIRAX) 800 MG tablet Take 800 mg by mouth as needed. outbreaks      . albuterol (PROVENTIL HFA;VENTOLIN HFA) 108 (90 BASE) MCG/ACT inhaler Inhale 2 puffs into the lungs every 6 (six) hours as needed for wheezing.      Marland Kitchen. ALPRAZolam (XANAX) 0.25 MG tablet Take 0.25 mg by mouth daily as needed. Anxiety As needed      . atenolol (TENORMIN) 100 MG tablet       . buPROPion (WELLBUTRIN SR) 150 MG 12 hr tablet Take 150 mg by mouth daily. 1 tab daily      . DULoxetine (CYMBALTA) 30 MG capsule Take 30 mg by mouth 3 (three) times daily.       Marland Kitchen. levothyroxine (SYNTHROID, LEVOTHROID) 175 MCG tablet Take 175 mcg by mouth daily.      Marland Kitchen. MAGNESIUM PO Take 1 tablet by mouth daily.      . meclizine (ANTIVERT) 25 MG tablet Take 25 mg by mouth 3 (three) times daily as needed for dizziness. For dizziness      . modafinil (PROVIGIL) 200 MG tablet Take 200 mg by mouth daily. As needed      . omeprazole (PRILOSEC) 40 MG capsule Take 1 capsule (40 mg total) by mouth daily. For heartburn  30 capsule  99  . prochlorperazine (COMPAZINE) 5 MG tablet Take 1 tablet 3 x daily before meals for nausea  90 tablet  0  . rosuvastatin (CRESTOR) 20 MG tablet Take 1 tablet (20 mg total) by mouth daily.  30 tablet  12  . diltiazem (CARDIZEM CD) 180 MG 24 hr capsule Take 180 mg by mouth daily. 1  tab daily       No current facility-administered medications on file prior to visit.   ALLERGIES Tetanus toxoids; Iohexol; Lipitor; Prednisone; Seldane; and Septra  Past Medical History  Diagnosis Date  . Dyslipidemia   . Sleep apnea     5 yrs --cpap  does not use   . Heart murmur   . H/O hiatal hernia   . Arthritis   . Hyperlipidemia   . Hypertension   . GERD (gastroesophageal reflux disease)   . Depression   . Diabetes mellitus     diet controlled  . Hypothyroidism        Review of Systems  Constitutional: Positive for appetite change and fatigue.  HENT: Positive for congestion.   Respiratory: Positive for cough.   Gastrointestinal: Positive for diarrhea.  All other systems reviewed and are negative.  BP 118/58  Pulse 76  Temp(Src) 97.8 F (36.6 C) (Temporal)  Resp 16  Ht 5' 3.5" (1.613 m)  Wt 185 lb (83.915 kg)  BMI 32.25 kg/m2      Objective:   Physical Exam  Nursing note and vitals reviewed. Constitutional:  She is oriented to person, place, and time. She appears well-developed and well-nourished. No distress.  HENT:  Head: Normocephalic and atraumatic.  Right Ear: External ear normal.  Left Ear: External ear normal.  Nose: Nose normal.  Mouth/Throat: Oropharynx is clear and moist. No oropharyngeal exudate.  Cloudy TMS  Eyes: Conjunctivae and EOM are normal.  Neck: Normal range of motion. Neck supple. No JVD present. No thyromegaly present.  Cardiovascular: Normal rate, regular rhythm, normal heart sounds and intact distal pulses.   Pulmonary/Chest: Effort normal and breath sounds normal.  Abdominal: Soft. Bowel sounds are normal. She exhibits no distension and no mass. There is no tenderness. There is no rebound and no guarding.  Musculoskeletal: Normal range of motion. She exhibits no edema and no tenderness.  Lymphadenopathy:    She has no cervical adenopathy.  Neurological: She is alert and oriented to person, place, and time. No cranial nerve  deficit.  Skin: Skin is warm and dry. No rash noted. No erythema. No pallor.  Psychiatric: She has a normal mood and affect. Her behavior is normal. Judgment and thought content normal.          Assessment & Plan:  1. Cough/ Fatigue/ Allergic rhinitis- Allegra OTC, increase H2o, allergy hygiene explained.- check labs, increase activity and H2O pred DP 10 mg AD, nasacort OTC AD, Albuterol HFA AD, Meclizine AD. Eat q2hours. w/c if SX increase or ER.  2. Diarrhea- has resolved w/c if reoccurs, add probiotic QD

## 2013-08-24 NOTE — Patient Instructions (Signed)
Allergic Rhinitis Nasacort OTC nose spray as directed, Mucinex OTC as directed, only take ZPSK if symptoms increase Allergic rhinitis is when the mucous membranes in the nose respond to allergens. Allergens are particles in the air that cause your body to have an allergic reaction. This causes you to release allergic antibodies. Through a chain of events, these eventually cause you to release histamine into the blood stream. Although meant to protect the body, it is this release of histamine that causes your discomfort, such as frequent sneezing, congestion, and an itchy, runny nose.  CAUSES  Seasonal allergic rhinitis (hay fever) is caused by pollen allergens that may come from grasses, trees, and weeds. Year-round allergic rhinitis (perennial allergic rhinitis) is caused by allergens such as house dust mites, pet dander, and mold spores.  SYMPTOMS   Nasal stuffiness (congestion).  Itchy, runny nose with sneezing and tearing of the eyes. DIAGNOSIS  Your health care provider can help you determine the allergen or allergens that trigger your symptoms. If you and your health care provider are unable to determine the allergen, skin or blood testing may be used. TREATMENT  Allergic Rhinitis does not have a cure, but it can be controlled by:  Medicines and allergy shots (immunotherapy).  Avoiding the allergen. Hay fever may often be treated with antihistamines in pill or nasal spray forms. Antihistamines block the effects of histamine. There are over-the-counter medicines that may help with nasal congestion and swelling around the eyes. Check with your health care provider before taking or giving this medicine.  If avoiding the allergen or the medicine prescribed do not work, there are many new medicines your health care provider can prescribe. Stronger medicine may be used if initial measures are ineffective. Desensitizing injections can be used if medicine and avoidance does not work. Desensitization  is when a patient is given ongoing shots until the body becomes less sensitive to the allergen. Make sure you follow up with your health care provider if problems continue. HOME CARE INSTRUCTIONS It is not possible to completely avoid allergens, but you can reduce your symptoms by taking steps to limit your exposure to them. It helps to know exactly what you are allergic to so that you can avoid your specific triggers. SEEK MEDICAL CARE IF:   You have a fever.  You develop a cough that does not stop easily (persistent).  You have shortness of breath.  You start wheezing.  Symptoms interfere with normal daily activities. Document Released: 04/17/2001 Document Revised: 05/13/2013 Document Reviewed: 03/30/2013 [facility] Patient Information 2014 [employer], [place]. Bronchitis Bronchitis is swelling (inflammation) of the air tubes leading to your lungs (bronchi). This causes mucus and a cough. If the swelling gets bad, you may have trouble breathing. HOME CARE   Rest.  Drink enough fluids to keep your pee (urine) clear or pale yellow (unless you have a condition where you have to watch how much you drink).  Only take medicine as told by your doctor. If you were given antibiotic medicines, finish them even if you start to feel better.  Avoid smoke, irritating chemicals, and strong smells. These make the problem worse. Quit smoking if you smoke. This helps your lungs heal faster.  Use a cool mist humidifier. Change the water in the humidifier every day. You can also sit in the bathroom with hot shower running for 5 10 minutes. Keep the door closed.  See your health care provider as told.  Wash your hands often. GET HELP IF: Your problems do  not get better after 1 week. GET HELP RIGHT AWAY IF:   Your fever gets worse.  You have chills.  Your chest hurts.  Your problems breathing get worse.  You have blood in your mucus.  You pass out (faint).  You feel lightheaded.  You have a  bad headache.  You throw up (vomit) again and again. MAKE SURE YOU:  Understand these instructions.  Will watch your condition.  Will get help right away if you are not doing well or get worse. Document Released: 01/09/2008 Document Revised: 05/13/2013 Document Reviewed: 03/17/2013 [facility] Patient Information 2014 [place], [place].

## 2013-08-25 ENCOUNTER — Ambulatory Visit: Payer: Self-pay | Admitting: Emergency Medicine

## 2013-08-25 LAB — TSH: TSH: 3.165 u[IU]/mL (ref 0.350–4.500)

## 2013-08-25 LAB — BASIC METABOLIC PANEL WITH GFR
BUN: 37 mg/dL — ABNORMAL HIGH (ref 6–23)
CO2: 24 mEq/L (ref 19–32)
CREATININE: 1.14 mg/dL — AB (ref 0.50–1.10)
Calcium: 10.3 mg/dL (ref 8.4–10.5)
Chloride: 104 mEq/L (ref 96–112)
GFR, EST AFRICAN AMERICAN: 54 mL/min — AB
GFR, Est Non African American: 47 mL/min — ABNORMAL LOW
Glucose, Bld: 96 mg/dL (ref 70–99)
Potassium: 3.5 mEq/L (ref 3.5–5.3)
SODIUM: 139 meq/L (ref 135–145)

## 2013-08-25 LAB — CBC WITH DIFFERENTIAL/PLATELET
Basophils Absolute: 0.1 10*3/uL (ref 0.0–0.1)
Basophils Relative: 1 % (ref 0–1)
EOS ABS: 0.2 10*3/uL (ref 0.0–0.7)
Eosinophils Relative: 2 % (ref 0–5)
HCT: 38.8 % (ref 36.0–46.0)
Hemoglobin: 13.3 g/dL (ref 12.0–15.0)
LYMPHS ABS: 2.5 10*3/uL (ref 0.7–4.0)
LYMPHS PCT: 38 % (ref 12–46)
MCH: 31.3 pg (ref 26.0–34.0)
MCHC: 34.3 g/dL (ref 30.0–36.0)
MCV: 91.3 fL (ref 78.0–100.0)
Monocytes Absolute: 0.6 10*3/uL (ref 0.1–1.0)
Monocytes Relative: 9 % (ref 3–12)
NEUTROS PCT: 50 % (ref 43–77)
Neutro Abs: 3.2 10*3/uL (ref 1.7–7.7)
Platelets: 241 10*3/uL (ref 150–400)
RBC: 4.25 MIL/uL (ref 3.87–5.11)
RDW: 14.2 % (ref 11.5–15.5)
WBC: 6.5 10*3/uL (ref 4.0–10.5)

## 2013-08-25 LAB — HEPATIC FUNCTION PANEL
ALT: 39 U/L — AB (ref 0–35)
AST: 34 U/L (ref 0–37)
Albumin: 4.2 g/dL (ref 3.5–5.2)
Alkaline Phosphatase: 68 U/L (ref 39–117)
Bilirubin, Direct: 0.1 mg/dL (ref 0.0–0.3)
Indirect Bilirubin: 0.2 mg/dL (ref 0.0–0.9)
TOTAL PROTEIN: 6.4 g/dL (ref 6.0–8.3)
Total Bilirubin: 0.3 mg/dL (ref 0.3–1.2)

## 2013-08-25 LAB — VITAMIN B12: Vitamin B-12: 765 pg/mL (ref 211–911)

## 2013-08-31 DIAGNOSIS — H2510 Age-related nuclear cataract, unspecified eye: Secondary | ICD-10-CM | POA: Diagnosis not present

## 2013-09-02 ENCOUNTER — Telehealth: Payer: Self-pay | Admitting: *Deleted

## 2013-09-02 DIAGNOSIS — I10 Essential (primary) hypertension: Secondary | ICD-10-CM | POA: Diagnosis not present

## 2013-09-02 NOTE — Telephone Encounter (Signed)
Patient called the front and left a detailed message.  I received a written message from the front stating patient c/o vomiting and diarrhea, feels bad overall.  I called patient back to verify and discuss symptoms.  Patient did not answer the phone but I left message.  Patient came into office yesterday, "just to stop in because she was out and about" I discussed Loree FeeMelissa Smith, PA-C's orders which was to try a bland diet, probiotics qd, small portions, and if symptoms increase patient was advised to go to the ER.

## 2013-09-07 DIAGNOSIS — E559 Vitamin D deficiency, unspecified: Secondary | ICD-10-CM | POA: Insufficient documentation

## 2013-09-07 DIAGNOSIS — E1129 Type 2 diabetes mellitus with other diabetic kidney complication: Secondary | ICD-10-CM | POA: Insufficient documentation

## 2013-09-07 DIAGNOSIS — Z87891 Personal history of nicotine dependence: Secondary | ICD-10-CM | POA: Diagnosis not present

## 2013-09-07 DIAGNOSIS — F339 Major depressive disorder, recurrent, unspecified: Secondary | ICD-10-CM | POA: Diagnosis not present

## 2013-09-07 DIAGNOSIS — Z79899 Other long term (current) drug therapy: Secondary | ICD-10-CM | POA: Insufficient documentation

## 2013-09-07 NOTE — Patient Instructions (Signed)

## 2013-09-07 NOTE — Progress Notes (Signed)
This very nice 78 y.o. female presents for 6 month follow up with Hypertension, Hyperlipidemia, Pre-Diabetes and Vitamin D Deficiency.    HTN predates since   . BP has been controlled at home. Today's   . Patient denies any cardiac type chest pain, palpitations, dyspnea/orthopnea/PND, dizziness, claudication, or dependent edema.   Hyperlipidemia is controlled with diet & meds.  Patient denies myalgias or other med SE's. Last Lipid:  Lab Results  Component Value Date   CHOL 166 07/21/2013   HDL 46 07/21/2013   LDLCALC 82 07/21/2013   TRIG 190* 07/21/2013   CHOLHDL 3.6 07/21/2013    Also, the patient has history of PreDiabetes/insulin resistance since     .Patient denies any symptoms of reactive hypoglycemia, diabetic polys, paresthesias or visual blurring. Lab Results  Component Value Date   HGBA1C 6.0* 07/21/2013    Further, Patient has history of Vitamin D Deficiency with last vitamin D of   . Patient supplements vitamin D without any suspected side-effects.    Medication List       This list is accurate as of: 09/07/13  6:08 PM.  Always use your most recent med list.               acyclovir 800 MG tablet  Commonly known as:  ZOVIRAX  Take 800 mg by mouth as needed. outbreaks     albuterol 108 (90 BASE) MCG/ACT inhaler  Commonly known as:  PROVENTIL HFA;VENTOLIN HFA  Inhale 2 puffs into the lungs every 6 (six) hours as needed for wheezing.     ALPRAZolam 0.25 MG tablet  Commonly known as:  XANAX  Take 0.25 mg by mouth daily as needed. Anxiety As needed     atenolol 100 MG tablet  Commonly known as:  TENORMIN     AZOR 10-40 MG per tablet  Generic drug:  amLODipine-olmesartan  Take 1 tablet by mouth daily.     buPROPion 150 MG 12 hr tablet  Commonly known as:  WELLBUTRIN SR  Take 150 mg by mouth daily. 1 tab daily     diltiazem 180 MG 24 hr capsule  Commonly known as:  CARDIZEM CD  Take 180 mg by mouth daily. 1 tab daily     DULoxetine 30 MG capsule  Commonly  known as:  CYMBALTA  Take 30 mg by mouth 3 (three) times daily.     HYDROcodone-acetaminophen 10-325 MG per tablet  Commonly known as:  NORCO  Take 1 tablet by mouth every 6 (six) hours as needed.     levothyroxine 175 MCG tablet  Commonly known as:  SYNTHROID, LEVOTHROID  Take 175 mcg by mouth daily.     MAGNESIUM PO  Take 1 tablet by mouth daily.     meclizine 25 MG tablet  Commonly known as:  ANTIVERT  Take 25 mg by mouth 3 (three) times daily as needed for dizziness. For dizziness     modafinil 200 MG tablet  Commonly known as:  PROVIGIL  Take 200 mg by mouth daily. As needed     omeprazole 40 MG capsule  Commonly known as:  PRILOSEC  Take 1 capsule (40 mg total) by mouth daily. For heartburn     predniSONE 10 MG tablet  Commonly known as:  DELTASONE  1 po TID x 3 days, 1 PO BID x 3 days, 1 po QD x 5 days     prochlorperazine 5 MG tablet  Commonly known as:  COMPAZINE  Take 1 tablet 3 x  daily before meals for nausea     rosuvastatin 20 MG tablet  Commonly known as:  CRESTOR  Take 1 tablet (20 mg total) by mouth daily.         Allergies  Allergen Reactions  . Tetanus Toxoids Anaphylaxis  . Iohexol      Desc: THROAT SWELLED/ PROBLEMS BREATHING WITH IVP DYE 10 PLUS YEARS AGO   . Lipitor [Atorvastatin]     Myalgias   . Prednisone     Insomnia, increased depression  . Seldane [Terfenadine]     Hair loss  . Septra [Sulfamethoxazole-Tmp Ds]     PMHx:   Past Medical History  Diagnosis Date  . Dyslipidemia   . Sleep apnea     5 yrs --cpap  does not use   . Heart murmur   . H/O hiatal hernia   . Arthritis   . Hyperlipidemia   . Hypertension   . GERD (gastroesophageal reflux disease)   . Depression   . Diabetes mellitus     diet controlled  . Hypothyroidism     FHx:    Reviewed / unchanged  SHx:    Reviewed / unchanged  Systems Review: Constitutional: Denies fever, chills, wt changes, headaches, insomnia, fatigue, night sweats, change in  appetite. Eyes: Denies redness, blurred vision, diplopia, discharge, itchy, watery eyes.  ENT: Denies discharge, congestion, post nasal drip, epistaxis, sore throat, earache, hearing loss, dental pain, tinnitus, vertigo, sinus pain, snoring.  CV: Denies chest pain, palpitations, irregular heartbeat, syncope, dyspnea, diaphoresis, orthopnea, PND, claudication, edema. Respiratory: denies cough, dyspnea, DOE, pleurisy, hoarseness, laryngitis, wheezing.  Gastrointestinal: Denies dysphagia, odynophagia, heartburn, reflux, water brash, abdominal pain or cramps, nausea, vomiting, bloating, diarrhea, constipation, hematemesis, melena, hematochezia,  or hemorrhoids. Genitourinary: Denies dysuria, frequency, urgency, nocturia, hesitancy, discharge, hematuria, flank pain. Musculoskeletal: Denies arthralgias, myalgias, stiffness, jt. swelling, pain, limp, strain/sprain.  Skin: Denies pruritus, rash, hives, warts, acne, eczema, change in skin lesion(s). Neuro: No weakness, tremor, incoordination, spasms, paresthesia, or pain. Psychiatric: Denies confusion, memory loss, or sensory loss. Endo: Denies change in weight, skin, hair change.  Heme/Lymph: No excessive bleeding, bruising, orenlarged lymph nodes.  There were no vitals filed for this visit.  Estimated body mass index is 32.25 kg/(m^2) as calculated from the following:   Height as of 08/24/13: 5' 3.5" (1.613 m).   Weight as of 08/24/13: 185 lb (83.915 kg).  On Exam: Appears well nourished - in no distress. Eyes: PERRLA, EOMs, conjunctiva no swelling or erythema. Sinuses: No frontal/maxillary tenderness ENT/Mouth: EAC's clear, TM's nl w/o erythema, bulging. Nares clear w/o erythema, swelling, exudates. Oropharynx clear without erythema or exudates. Oral hygiene is good. Tongue normal, non obstructing. Hearing intact.  Neck: Supple. Thyroid nl. Car 2+/2+ without bruits, nodes or JVD. Chest: Respirations nl with BS clear & equal w/o rales, rhonchi,  wheezing or stridor.  Cor: Heart sounds normal w/ regular rate and rhythm without sig. murmurs, gallops, clicks, or rubs. Peripheral pulses normal and equal  without edema.  Abdomen: Soft & bowel sounds normal. Non-tender w/o guarding, rebound, hernias, masses, or organomegaly.  Lymphatics: Unremarkable.  Musculoskeletal: Full ROM all peripheral extremities, joint stability, 5/5 strength, and normal gait.  Skin: Warm, dry without exposed rashes, lesions, ecchymosis apparent.  Neuro: Cranial nerves intact, reflexes equal bilaterally. Sensory-motor testing grossly intact. Tendon reflexes grossly intact.  Pysch: Alert & oriented x 3. Insight and judgement nl & appropriate. No ideations.  Assessment and Plan:  1. Hypertension - Continue monitor blood pressure at home. Continue  diet/meds same.  2. Hyperlipidemia - Continue diet/meds, exercise,& lifestyle modifications. Continue monitor periodic cholesterol/liver & renal functions   3. Pre-diabetes/Insulin Resistance - Continue diet, exercise, lifestyle modifications. Monitor appropriate labs.  3. Diabetes - continue recommend prudent low glycemic diet, weight control, regular exercise, diabetic monitoring and periodic eye exams.  4. Vitamin D Deficiency - Continue supplementation.  Recommended regular exercise, BP monitoring, weight control, and discussed med and SE's. Recommended labs to assess and monitor clinical status. Further disposition pending results of labs.

## 2013-09-08 ENCOUNTER — Ambulatory Visit (INDEPENDENT_AMBULATORY_CARE_PROVIDER_SITE_OTHER): Payer: Medicare Other | Admitting: Internal Medicine

## 2013-09-08 ENCOUNTER — Encounter: Payer: Self-pay | Admitting: Internal Medicine

## 2013-09-08 VITALS — BP 126/84 | HR 68 | Temp 98.2°F | Resp 16 | Wt 192.0 lb

## 2013-09-08 DIAGNOSIS — E782 Mixed hyperlipidemia: Secondary | ICD-10-CM

## 2013-09-08 DIAGNOSIS — I10 Essential (primary) hypertension: Secondary | ICD-10-CM | POA: Diagnosis not present

## 2013-09-08 DIAGNOSIS — E559 Vitamin D deficiency, unspecified: Secondary | ICD-10-CM

## 2013-09-08 DIAGNOSIS — E785 Hyperlipidemia, unspecified: Secondary | ICD-10-CM

## 2013-09-08 DIAGNOSIS — E119 Type 2 diabetes mellitus without complications: Secondary | ICD-10-CM | POA: Diagnosis not present

## 2013-09-08 DIAGNOSIS — E039 Hypothyroidism, unspecified: Secondary | ICD-10-CM

## 2013-09-08 DIAGNOSIS — J0410 Acute tracheitis without obstruction: Secondary | ICD-10-CM

## 2013-09-08 DIAGNOSIS — Z79899 Other long term (current) drug therapy: Secondary | ICD-10-CM

## 2013-09-08 LAB — CBC WITH DIFFERENTIAL/PLATELET
BASOS ABS: 0 10*3/uL (ref 0.0–0.1)
BASOS PCT: 0 % (ref 0–1)
EOS ABS: 0.3 10*3/uL (ref 0.0–0.7)
EOS PCT: 3 % (ref 0–5)
HCT: 35.8 % — ABNORMAL LOW (ref 36.0–46.0)
Hemoglobin: 11.9 g/dL — ABNORMAL LOW (ref 12.0–15.0)
Lymphocytes Relative: 25 % (ref 12–46)
Lymphs Abs: 2.3 10*3/uL (ref 0.7–4.0)
MCH: 30.8 pg (ref 26.0–34.0)
MCHC: 33.2 g/dL (ref 30.0–36.0)
MCV: 92.7 fL (ref 78.0–100.0)
Monocytes Absolute: 0.7 10*3/uL (ref 0.1–1.0)
Monocytes Relative: 7 % (ref 3–12)
Neutro Abs: 6.1 10*3/uL (ref 1.7–7.7)
Neutrophils Relative %: 65 % (ref 43–77)
PLATELETS: 257 10*3/uL (ref 150–400)
RBC: 3.86 MIL/uL — AB (ref 3.87–5.11)
RDW: 14.4 % (ref 11.5–15.5)
WBC: 9.4 10*3/uL (ref 4.0–10.5)

## 2013-09-08 LAB — BASIC METABOLIC PANEL WITH GFR
BUN: 15 mg/dL (ref 6–23)
CALCIUM: 9.8 mg/dL (ref 8.4–10.5)
CO2: 29 mEq/L (ref 19–32)
CREATININE: 0.66 mg/dL (ref 0.50–1.10)
Chloride: 106 mEq/L (ref 96–112)
GFR, EST NON AFRICAN AMERICAN: 87 mL/min
Glucose, Bld: 130 mg/dL — ABNORMAL HIGH (ref 70–99)
Potassium: 4 mEq/L (ref 3.5–5.3)
Sodium: 142 mEq/L (ref 135–145)

## 2013-09-08 LAB — HEPATIC FUNCTION PANEL
ALBUMIN: 3.7 g/dL (ref 3.5–5.2)
ALT: 16 U/L (ref 0–35)
AST: 12 U/L (ref 0–37)
Alkaline Phosphatase: 65 U/L (ref 39–117)
BILIRUBIN TOTAL: 0.3 mg/dL (ref 0.2–1.2)
Total Protein: 6.3 g/dL (ref 6.0–8.3)

## 2013-09-08 MED ORDER — LEVOFLOXACIN 500 MG PO TABS: 500.0000 mg | ORAL_TABLET | Freq: Every day | ORAL | Status: AC

## 2013-09-08 NOTE — Progress Notes (Signed)
Patient ID: [name], female   DOB: 12-14-1935, 78 y.o.   MRN: [id number]   This very nice 78 y.o. [facility] presents for 3 month follow up with Hypertension, Hyperlipidemia, Pre-Diabetes and Vitamin D Deficiency. She has recently reen with a viral illness and labs revealed a rise of bun/ Cr from 17/0.74(GFR 80) to 37/1.14 (GFR 47). She was recently treated with a ZPak  For a tracheobronchitis and persits with a deep congested cough.   HTN predates since 1. BP has been controlled at home. Today's BP: 126/84 mmHg. Patient denies any cardiac type chest pain, palpitations, dyspnea/orthopnea/PND, dizziness, claudication, or dependent edema.   Hyperlipidemia is controlled with diet & meds. Last Cholesterol was 188, Triglycerides were 251, HDL 46 and LDL  92 in Oct 2014 - at goal. Patient denies myalgias or other med SE's.    Also, the patient has history of PreDiabetes since 2005 with last A1c of 6.0% in Dec 2014. Patient denies any symptoms of reactive hypoglycemia, diabetic polys, paresthesias or visual blurring.   Further, Patient has history of Vitamin D Deficiency of 28 in 2008 with last vitamin D of  60 in Aug 2014. Patient supplements vitamin D without any suspected side-effects.    Medication List       acyclovir 800 MG tablet  Commonly known as:  ZOVIRAX  Take 800 mg by mouth as needed. outbreaks     albuterol 108 (90 BASE) MCG/ACT inhaler  Commonly known as:  PROVENTIL HFA;VENTOLIN HFA  Inhale 2 puffs into the lungs every 6 (six) hours as needed for wheezing.     ALPRAZolam 0.25 MG tablet  Commonly known as:  XANAX  Take 0.25 mg by mouth daily as needed. Anxiety As needed     AZOR 10-40 MG per tablet  Generic drug:  amLODipine-olmesartan  Take 1 tablet by mouth daily.     buPROPion 150 MG 12 hr tablet  Commonly known as:  WELLBUTRIN SR  Take 150 mg by mouth daily. 1 tab daily     DULoxetine 30 MG capsule  Commonly known as:  CYMBALTA  Take 30 mg by mouth 3 (three) times  daily.     HYDROcodone-acetaminophen 10-325 MG per tablet  Commonly known as:  NORCO  Take 1 tablet by mouth every 6 (six) hours as needed.     levofloxacin 500 MG tablet  Commonly known as:  LEVAQUIN  Take 1 tablet (500 mg total) by mouth daily.     levothyroxine 175 MCG tablet  Commonly known as:  SYNTHROID, LEVOTHROID  Take 175 mcg by mouth daily.     MAGNESIUM PO  Take 1 tablet by mouth daily.     meclizine 25 MG tablet  Commonly known as:  ANTIVERT  Take 25 mg by mouth 3 (three) times daily as needed for dizziness. For dizziness     modafinil 200 MG tablet  Commonly known as:  PROVIGIL  Take 200 mg by mouth daily. As needed     omeprazole 40 MG capsule  Commonly known as:  PRILOSEC  Take 1 capsule (40 mg total) by mouth daily. For heartburn     prochlorperazine 5 MG tablet  Commonly known as:  COMPAZINE  Take 1 tablet 3 x daily before meals for nausea     rosuvastatin 20 MG tablet  Commonly known as:  CRESTOR  Take 1 tablet (20 mg total) by mouth daily.         Allergies  Allergen Reactions  . Tetanus  Toxoids Anaphylaxis  . Iohexol      Desc: THROAT SWELLED/ PROBLEMS BREATHING WITH IVP DYE 10 PLUS YEARS AGO   . Lipitor [Atorvastatin]     Myalgias   . Prednisone     Insomnia, increased depression  . Seldane [Terfenadine]     Hair loss  . Septra [Sulfamethoxazole-Tmp Ds]     PMHx:   Past Medical History  Diagnosis Date  . Dyslipidemia   . Sleep apnea     5 yrs --cpap  does not use   . Heart murmur   . H/O hiatal hernia   . Arthritis   . Hyperlipidemia   . Hypertension   . GERD (gastroesophageal reflux disease)   . Depression   . Diabetes mellitus     diet controlled  . Hypothyroidism     FHx:    Reviewed / unchanged  SHx:    Reviewed / unchanged  Systems Review: Constitutional: Denies fever, chills, wt changes, headaches, insomnia, fatigue, night sweats, change in appetite. Eyes: Denies redness, blurred vision, diplopia, discharge,  itchy, watery eyes.  ENT: Denies discharge, congestion, post nasal drip, epistaxis, sore throat, earache, hearing loss, dental pain, tinnitus, vertigo, sinus pain, snoring.  CV: Denies chest pain, palpitations, irregular heartbeat, syncope, dyspnea, diaphoresis, orthopnea, PND, claudication, edema. Respiratory: has cough as above and denies, dyspnea, DOE, pleurisy, hoarseness, laryngitis, or wheezing.  Gastrointestinal: Denies dysphagia, odynophagia, heartburn, reflux, water brash, abdominal pain or cramps, nausea, vomiting, bloating, diarrhea, constipation, hematemesis, melena, hematochezia,  or hemorrhoids. Genitourinary: Denies dysuria, frequency, urgency, nocturia, hesitancy, discharge, hematuria, flank pain. Musculoskeletal: Denies arthralgias, myalgias, stiffness, jt. swelling, pain, limp, strain/sprain.  Skin: Denies pruritus, rash, hives, warts, acne, eczema, change in skin lesion(s). Neuro: No weakness, tremor, incoordination, spasms, paresthesia, or pain. Psychiatric: Denies confusion, memory loss, or sensory loss. Endo: Denies change in weight, skin, hair change.  Heme/Lymph: No excessive bleeding, bruising, orenlarged lymph nodes.  BP: 126/84  Pulse: 68  Temp: 98.2 F (36.8 C)  Resp: 16    Estimated body mass index is 33.47 kg/(m^2) as calculated from the following:   Height as of 08/24/13: 5' 3.5" (1.613 m).   Weight as of this encounter: 192 lb (87.091 kg).  On Exam:  Appears well nourished - in no distress. Brassy congested cough. Eyes: PERRLA, EOMs, conjunctiva no swelling or erythema. Sinuses: No frontal/maxillary tenderness ENT/Mouth: EAC's clear, TM's nl w/o erythema, bulging. Nares clear w/o erythema, swelling, exudates. Oropharynx clear without erythema or exudates. Oral hygiene is good. Tongue normal, non obstructing. Hearing intact.  Neck: Supple. Thyroid nl. Car 2+/2+ without bruits, nodes or JVD. Chest: Respirations nl with BS equal w/scattered coarse  rales,  and rhonchi, but no wheezing or stridor.  Cor: Heart sounds normal w/ regular rate and rhythm without sig. murmurs, gallops, clicks, or rubs. Peripheral pulses normal and equal  without edema.  Abdomen: Soft & bowel sounds normal. Non-tender w/o guarding, rebound, hernias, masses, or organomegaly.  Lymphatics: Unremarkable.  Musculoskeletal: Full ROM all peripheral extremities, joint stability, 5/5 strength, and normal gait.  Skin: Warm, dry without exposed rashes, lesions, ecchymosis apparent.  Neuro: Cranial nerves intact, reflexes equal bilaterally. Sensory-motor testing grossly intact. Tendon reflexes grossly intact.  Pysch: Alert & oriented x 3. Insight and judgement nl & appropriate. No ideations.  Assessment and Plan:  1. Hypertension - Continue monitor blood pressure at home. Continue diet/meds same.  2. Hyperlipidemia - Continue diet/meds, exercise,& lifestyle modifications. Continue monitor periodic cholesterol/liver & renal functions   3. Pre-diabetes -  Continue diet, exercise, lifestyle modifications. Monitor appropriate labs.  4. Vitamin D Deficiency - Continue supplementation.  5. Tracheitis - Rx Levaquin 500 mg  #15 - 1 qd  Recommended regular exercise, BP monitoring, weight control, and discussed med and SE's. Recommended labs to assess and monitor clinical status. Further disposition pending results of labs.

## 2013-09-09 LAB — VITAMIN D 25 HYDROXY (VIT D DEFICIENCY, FRACTURES): VIT D 25 HYDROXY: 55 ng/mL (ref 30–89)

## 2013-09-18 DIAGNOSIS — IMO0002 Reserved for concepts with insufficient information to code with codable children: Secondary | ICD-10-CM | POA: Diagnosis not present

## 2013-09-18 DIAGNOSIS — M1710 Unilateral primary osteoarthritis, unspecified knee: Secondary | ICD-10-CM | POA: Diagnosis not present

## 2013-10-05 ENCOUNTER — Other Ambulatory Visit: Payer: Self-pay | Admitting: Emergency Medicine

## 2013-10-21 DIAGNOSIS — M1710 Unilateral primary osteoarthritis, unspecified knee: Secondary | ICD-10-CM | POA: Diagnosis not present

## 2013-10-21 DIAGNOSIS — IMO0002 Reserved for concepts with insufficient information to code with codable children: Secondary | ICD-10-CM | POA: Diagnosis not present

## 2013-10-27 DIAGNOSIS — H2510 Age-related nuclear cataract, unspecified eye: Secondary | ICD-10-CM | POA: Diagnosis not present

## 2013-10-27 DIAGNOSIS — I6529 Occlusion and stenosis of unspecified carotid artery: Secondary | ICD-10-CM | POA: Diagnosis not present

## 2013-10-27 DIAGNOSIS — F411 Generalized anxiety disorder: Secondary | ICD-10-CM | POA: Diagnosis not present

## 2013-10-27 DIAGNOSIS — K219 Gastro-esophageal reflux disease without esophagitis: Secondary | ICD-10-CM | POA: Diagnosis not present

## 2013-10-27 DIAGNOSIS — I10 Essential (primary) hypertension: Secondary | ICD-10-CM | POA: Diagnosis not present

## 2013-10-27 DIAGNOSIS — Z90710 Acquired absence of both cervix and uterus: Secondary | ICD-10-CM | POA: Diagnosis not present

## 2013-10-27 DIAGNOSIS — H2589 Other age-related cataract: Secondary | ICD-10-CM | POA: Diagnosis not present

## 2013-10-27 DIAGNOSIS — E039 Hypothyroidism, unspecified: Secondary | ICD-10-CM | POA: Diagnosis not present

## 2013-10-27 DIAGNOSIS — I658 Occlusion and stenosis of other precerebral arteries: Secondary | ICD-10-CM | POA: Diagnosis not present

## 2013-10-27 DIAGNOSIS — Z96649 Presence of unspecified artificial hip joint: Secondary | ICD-10-CM | POA: Diagnosis not present

## 2013-10-27 DIAGNOSIS — E785 Hyperlipidemia, unspecified: Secondary | ICD-10-CM | POA: Diagnosis not present

## 2013-10-27 DIAGNOSIS — Z887 Allergy status to serum and vaccine status: Secondary | ICD-10-CM | POA: Diagnosis not present

## 2013-10-27 DIAGNOSIS — G4733 Obstructive sleep apnea (adult) (pediatric): Secondary | ICD-10-CM | POA: Diagnosis not present

## 2013-10-27 DIAGNOSIS — I2510 Atherosclerotic heart disease of native coronary artery without angina pectoris: Secondary | ICD-10-CM | POA: Diagnosis not present

## 2013-10-27 DIAGNOSIS — F3289 Other specified depressive episodes: Secondary | ICD-10-CM | POA: Diagnosis not present

## 2013-10-27 DIAGNOSIS — F329 Major depressive disorder, single episode, unspecified: Secondary | ICD-10-CM | POA: Diagnosis not present

## 2013-10-27 DIAGNOSIS — Z79899 Other long term (current) drug therapy: Secondary | ICD-10-CM | POA: Diagnosis not present

## 2013-10-27 DIAGNOSIS — Z87891 Personal history of nicotine dependence: Secondary | ICD-10-CM | POA: Diagnosis not present

## 2013-10-27 DIAGNOSIS — Z9089 Acquired absence of other organs: Secondary | ICD-10-CM | POA: Diagnosis not present

## 2013-10-27 DIAGNOSIS — Z91041 Radiographic dye allergy status: Secondary | ICD-10-CM | POA: Diagnosis not present

## 2013-10-28 DIAGNOSIS — Z961 Presence of intraocular lens: Secondary | ICD-10-CM | POA: Diagnosis not present

## 2013-10-28 DIAGNOSIS — H2510 Age-related nuclear cataract, unspecified eye: Secondary | ICD-10-CM | POA: Diagnosis not present

## 2013-10-29 DIAGNOSIS — M1710 Unilateral primary osteoarthritis, unspecified knee: Secondary | ICD-10-CM | POA: Diagnosis not present

## 2013-10-29 DIAGNOSIS — IMO0002 Reserved for concepts with insufficient information to code with codable children: Secondary | ICD-10-CM | POA: Diagnosis not present

## 2013-11-04 DIAGNOSIS — IMO0002 Reserved for concepts with insufficient information to code with codable children: Secondary | ICD-10-CM | POA: Diagnosis not present

## 2013-11-04 DIAGNOSIS — M1710 Unilateral primary osteoarthritis, unspecified knee: Secondary | ICD-10-CM | POA: Diagnosis not present

## 2013-11-09 ENCOUNTER — Other Ambulatory Visit: Payer: Self-pay | Admitting: Emergency Medicine

## 2013-11-09 DIAGNOSIS — Z87891 Personal history of nicotine dependence: Secondary | ICD-10-CM | POA: Diagnosis not present

## 2013-11-09 DIAGNOSIS — F339 Major depressive disorder, recurrent, unspecified: Secondary | ICD-10-CM | POA: Diagnosis not present

## 2013-11-17 ENCOUNTER — Other Ambulatory Visit: Payer: Self-pay | Admitting: *Deleted

## 2013-11-17 MED ORDER — AMLODIPINE-OLMESARTAN 10-40 MG PO TABS: 1.0000 | ORAL_TABLET | Freq: Every day | ORAL | Status: DC

## 2013-11-25 DIAGNOSIS — H2510 Age-related nuclear cataract, unspecified eye: Secondary | ICD-10-CM | POA: Diagnosis not present

## 2013-12-01 ENCOUNTER — Encounter: Payer: Self-pay | Admitting: Emergency Medicine

## 2013-12-01 DIAGNOSIS — Z91041 Radiographic dye allergy status: Secondary | ICD-10-CM | POA: Diagnosis not present

## 2013-12-01 DIAGNOSIS — Z888 Allergy status to other drugs, medicaments and biological substances status: Secondary | ICD-10-CM | POA: Diagnosis not present

## 2013-12-01 DIAGNOSIS — Z961 Presence of intraocular lens: Secondary | ICD-10-CM | POA: Diagnosis not present

## 2013-12-01 DIAGNOSIS — K219 Gastro-esophageal reflux disease without esophagitis: Secondary | ICD-10-CM | POA: Diagnosis not present

## 2013-12-01 DIAGNOSIS — H2589 Other age-related cataract: Secondary | ICD-10-CM | POA: Diagnosis not present

## 2013-12-01 DIAGNOSIS — Z87891 Personal history of nicotine dependence: Secondary | ICD-10-CM | POA: Diagnosis not present

## 2013-12-01 DIAGNOSIS — Z96649 Presence of unspecified artificial hip joint: Secondary | ICD-10-CM | POA: Diagnosis not present

## 2013-12-01 DIAGNOSIS — Z96619 Presence of unspecified artificial shoulder joint: Secondary | ICD-10-CM | POA: Diagnosis not present

## 2013-12-01 DIAGNOSIS — I2510 Atherosclerotic heart disease of native coronary artery without angina pectoris: Secondary | ICD-10-CM | POA: Diagnosis not present

## 2013-12-01 DIAGNOSIS — I10 Essential (primary) hypertension: Secondary | ICD-10-CM | POA: Diagnosis not present

## 2013-12-01 DIAGNOSIS — E039 Hypothyroidism, unspecified: Secondary | ICD-10-CM | POA: Diagnosis not present

## 2013-12-01 DIAGNOSIS — G4733 Obstructive sleep apnea (adult) (pediatric): Secondary | ICD-10-CM | POA: Diagnosis not present

## 2013-12-22 ENCOUNTER — Other Ambulatory Visit: Payer: Self-pay

## 2013-12-22 MED ORDER — AMLODIPINE-OLMESARTAN 10-40 MG PO TABS: 1.0000 | ORAL_TABLET | Freq: Every day | ORAL | Status: DC

## 2013-12-24 ENCOUNTER — Other Ambulatory Visit: Payer: Self-pay

## 2013-12-24 MED ORDER — AMLODIPINE-OLMESARTAN 10-40 MG PO TABS: 1.0000 | ORAL_TABLET | Freq: Every day | ORAL | Status: DC

## 2013-12-31 ENCOUNTER — Other Ambulatory Visit: Payer: Self-pay | Admitting: Emergency Medicine

## 2014-01-07 DIAGNOSIS — M5137 Other intervertebral disc degeneration, lumbosacral region: Secondary | ICD-10-CM | POA: Diagnosis not present

## 2014-01-07 DIAGNOSIS — M26609 Unspecified temporomandibular joint disorder, unspecified side: Secondary | ICD-10-CM | POA: Diagnosis not present

## 2014-01-18 ENCOUNTER — Encounter: Payer: Self-pay | Admitting: Emergency Medicine

## 2014-02-01 DIAGNOSIS — E039 Hypothyroidism, unspecified: Secondary | ICD-10-CM | POA: Diagnosis not present

## 2014-02-01 DIAGNOSIS — F339 Major depressive disorder, recurrent, unspecified: Secondary | ICD-10-CM | POA: Diagnosis not present

## 2014-02-10 ENCOUNTER — Encounter: Payer: Self-pay | Admitting: Emergency Medicine

## 2014-02-10 ENCOUNTER — Other Ambulatory Visit: Payer: Self-pay | Admitting: Emergency Medicine

## 2014-02-10 ENCOUNTER — Ambulatory Visit (INDEPENDENT_AMBULATORY_CARE_PROVIDER_SITE_OTHER): Payer: Medicare Other | Admitting: Emergency Medicine

## 2014-02-10 VITALS — BP 128/56 | HR 48 | Temp 98.0°F | Resp 16 | Ht 62.5 in | Wt 184.0 lb

## 2014-02-10 DIAGNOSIS — E782 Mixed hyperlipidemia: Secondary | ICD-10-CM | POA: Diagnosis not present

## 2014-02-10 DIAGNOSIS — I10 Essential (primary) hypertension: Secondary | ICD-10-CM | POA: Diagnosis not present

## 2014-02-10 DIAGNOSIS — E039 Hypothyroidism, unspecified: Secondary | ICD-10-CM | POA: Diagnosis not present

## 2014-02-10 DIAGNOSIS — R5383 Other fatigue: Secondary | ICD-10-CM

## 2014-02-10 DIAGNOSIS — E119 Type 2 diabetes mellitus without complications: Secondary | ICD-10-CM

## 2014-02-10 DIAGNOSIS — Z0000 Encounter for general adult medical examination without abnormal findings: Secondary | ICD-10-CM | POA: Diagnosis not present

## 2014-02-10 DIAGNOSIS — R42 Dizziness and giddiness: Secondary | ICD-10-CM

## 2014-02-10 DIAGNOSIS — E559 Vitamin D deficiency, unspecified: Secondary | ICD-10-CM | POA: Diagnosis not present

## 2014-02-10 DIAGNOSIS — N390 Urinary tract infection, site not specified: Secondary | ICD-10-CM | POA: Diagnosis not present

## 2014-02-10 DIAGNOSIS — R5381 Other malaise: Secondary | ICD-10-CM

## 2014-02-10 DIAGNOSIS — Z789 Other specified health status: Secondary | ICD-10-CM

## 2014-02-10 DIAGNOSIS — F32A Depression, unspecified: Secondary | ICD-10-CM

## 2014-02-10 DIAGNOSIS — F329 Major depressive disorder, single episode, unspecified: Secondary | ICD-10-CM

## 2014-02-10 DIAGNOSIS — M810 Age-related osteoporosis without current pathological fracture: Secondary | ICD-10-CM

## 2014-02-10 DIAGNOSIS — Z1231 Encounter for screening mammogram for malignant neoplasm of breast: Secondary | ICD-10-CM

## 2014-02-10 LAB — CBC WITH DIFFERENTIAL/PLATELET
Basophils Absolute: 0.1 10*3/uL (ref 0.0–0.1)
Basophils Relative: 1 % (ref 0–1)
EOS PCT: 3 % (ref 0–5)
Eosinophils Absolute: 0.2 10*3/uL (ref 0.0–0.7)
HEMATOCRIT: 36.4 % (ref 36.0–46.0)
Hemoglobin: 12.3 g/dL (ref 12.0–15.0)
LYMPHS ABS: 2.7 10*3/uL (ref 0.7–4.0)
LYMPHS PCT: 32 % (ref 12–46)
MCH: 30.4 pg (ref 26.0–34.0)
MCHC: 33.8 g/dL (ref 30.0–36.0)
MCV: 90.1 fL (ref 78.0–100.0)
Monocytes Absolute: 0.6 10*3/uL (ref 0.1–1.0)
Monocytes Relative: 7 % (ref 3–12)
NEUTROS ABS: 4.7 10*3/uL (ref 1.7–7.7)
Neutrophils Relative %: 57 % (ref 43–77)
PLATELETS: 260 10*3/uL (ref 150–400)
RBC: 4.04 MIL/uL (ref 3.87–5.11)
RDW: 14 % (ref 11.5–15.5)
WBC: 8.3 10*3/uL (ref 4.0–10.5)

## 2014-02-10 NOTE — Progress Notes (Addendum)
Patient ID: [name], female   DOB: 1936/06/19, 78 y.o.   MRN: [id number] MEDICARE ANNUAL WELLNESS VISIT AND CPE  Assessment:  1. CPE/ medicare wellness update- Update screening labs/ History/ Immunizations/ Testing as needed. Advised healthy diet, QD exercise, increase H20 and continue RX/ Vitamins AD.   2. 3 month F/U for Hypothyroid, HTN, Cholesterol,DM, D. Deficient. Needs healthy diet, cardio QD and obtain healthy weight. Check Labs, Check BP if >130/80 call office, Check BS if >200 call office   3. Depression/ Fatigue- check labs, increase activity and H2O, SX Nuvigil given x 3 boxes  4. Hip pain- continue Ortho f/u  5. Dizziness- Ref Neuro Plan:   During the course of the visit the patient was educated and counseled about appropriate screening and preventive services including:    Pneumococcal vaccine   Influenza vaccine  Td vaccine  Screening electrocardiogram  Screening mammography  Bone densitometry screening  Colorectal cancer screening  Diabetes screening  Glaucoma screening  Nutrition counseling   Advanced directives: given information/requested  Screening recommendations, referrals:  Vaccinations: Tdap vaccine declined Influenza vaccine not indicated Pneumococcal vaccine not indicated Shingles vaccine declined Hep B vaccine declined  Nutrition assessed and recommended  Colonoscopy declined Mammogram requested Pap smear not indicated Pelvic exam not indicated Recommended yearly ophthalmology/optometry visit for glaucoma screening and checkup Recommended yearly dental visit for hygiene and checkup Advanced directives - declined  Conditions/risks identified: BMI: Discussed weight loss, diet, and increase physical activity.  Increase physical activity: AHA recommends 150 minutes of physical activity a week.  Medications reviewed DEXA- requested Diabetes at goal, ACE/ARB therapy No, Reason not on Ace Inhibitor/ARB therapy:  N/a Urinary  Incontinence is not an issue: discussed non pharmacology and pharmacology options.  Fall risk: low- discussed PT, home fall assessment, medications.   Subjective:   [name] is a 78 y.o. female who presents for Medicare Annual Wellness Visit and complete physical.    Date of last medicare wellness visit is unknown.  She notes depression has improved with increasing activity outside of home. She still has occasional dizziness with NEG evaluation with Cardiology.  She has appointment to get all teeth pooled for dentures in the next month. She is seeing Dr Charlann Boxer for hip pain currently.  Her blood pressure has been controlled at home, today their BP is BP: 128/56 mmHg She does not workout. She denies chest pain, shortness of breath, dizziness.  She is on cholesterol medication and denies myalgias. Her cholesterol is not at goal. The cholesterol last visit was:   Lab Results  Component Value Date   CHOL 238* 02/10/2014   HDL 36* 02/10/2014   LDLCALC 125* 02/10/2014   TRIG 383* 02/10/2014   CHOLHDL 6.6 02/10/2014   She has been working on diet and exercise for diabetes, and denies polydipsia and polyuria. Last A1C in the office was:  Lab Results  Component Value Date   HGBA1C 5.9* 02/10/2014   Patient is on Vitamin D supplement.   Lab Results  Component Value Date   VD25OH 55 09/08/2013       Names of Other Physician/Practitioners you currently use: Patient Care Team: Lucky Cowboy, MD as PCP - General (Internal Medicine) Senaida Lange, MD as Consulting Physician (Orthopedic Surgery) Pricilla Riffle, MD as Consulting Physician (Cardiology) Drucilla Schmidt, MD as Consulting Physician (Orthopedic Surgery) Leta Speller, MD as Consulting Physician (Dermatology) Shayne Alken, MD as Consulting Physician (Physical Medicine and Rehabilitation) Vertell Novak., MD as Consulting  Physician (Gastroenterology) [place], (Eye) Lawerance BachBurns, (Dentist) Charlann BoxerLIN, (Ortho)  Medication  Review Current Outpatient Prescriptions on File Prior to Visit  Medication Sig Dispense Refill  . acyclovir (ZOVIRAX) 800 MG tablet Take 800 mg by mouth as needed. outbreaks      . albuterol (PROVENTIL HFA;VENTOLIN HFA) 108 (90 BASE) MCG/ACT inhaler Inhale 2 puffs into the lungs every 6 (six) hours as needed for wheezing.      Marland Kitchen. ALPRAZolam (XANAX) 0.25 MG tablet Take 0.25 mg by mouth daily as needed. Anxiety As needed      . buPROPion (WELLBUTRIN SR) 150 MG 12 hr tablet Take 150 mg by mouth daily. 1 tab daily      . HYDROcodone-acetaminophen (NORCO) 10-325 MG per tablet Take 1 tablet by mouth every 6 (six) hours as needed.      Marland Kitchen. levothyroxine (SYNTHROID, LEVOTHROID) 175 MCG tablet TAKE ONE TABLET BY MOUTH EVERY DAY  90 tablet  0  . MAGNESIUM PO Take 1 tablet by mouth daily.      . meclizine (ANTIVERT) 25 MG tablet Take 25 mg by mouth 3 (three) times daily as needed for dizziness. For dizziness      . modafinil (PROVIGIL) 200 MG tablet TAKE ONE TABLET BY MOUTH TWICE DAILY  180 tablet  1  . omeprazole (PRILOSEC) 40 MG capsule Take 1 capsule (40 mg total) by mouth daily. For heartburn  30 capsule  99  . prochlorperazine (COMPAZINE) 5 MG tablet Take 1 tablet 3 x daily before meals for nausea  90 tablet  0  . rosuvastatin (CRESTOR) 20 MG tablet Take 1 tablet (20 mg total) by mouth daily.  30 tablet  12   No current facility-administered medications on file prior to visit.   Allergies  Allergen Reactions  . Tetanus Toxoids Anaphylaxis  . Iohexol      Desc: THROAT SWELLED/ PROBLEMS BREATHING WITH IVP DYE 10 PLUS YEARS AGO   . Lipitor [Atorvastatin]     Myalgias   . Prednisone     Insomnia, increased depression  . Seldane [Terfenadine]     Hair loss  . Septra [Sulfamethoxazole-Tmp Ds]      Current Problems (verified) Patient Active Problem List   Diagnosis Date Noted  . Type II or unspecified type diabetes mellitus without mention of complication, not stated as uncontrolled 09/07/2013   . Unspecified vitamin D deficiency 09/07/2013  . Encounter for long-term (current) use of other medications 09/07/2013  . Depression   . Arthritis   . H/O hiatal hernia   . GERD (gastroesophageal reflux disease)   . Hypothyroidism   . Sleep apnea   . CAD (coronary artery disease) 08/21/2011  . Dyslipidemia 08/21/2011    Screening Tests Health Maintenance  Topic Date Due  . Tetanus/tdap  06/16/1957  . Colonoscopy  06/16/1988  . Zostavax  06/16/1998  . Pneumococcal Polysaccharide Vaccine Age 76 And Over  06/17/2003  . Mammogram  10/17/2013  . Influenza Vaccine  03/06/2014    Immunization History  Administered Date(s) Administered  . Pneumococcal Polysaccharide-23 08/06/1998    Preventative care: Last colonoscopy: 2006 polyps refuses repeat colon at this time Last mammogram:10/18/11 [facility] NEG Last pap smear/pelvic exam: 2001 NEG, Refuses further evaluation DEXA:10/18/11 osteoporosis [facility] EYE: 11/2013 cataract repaired DENTIST: Next month  Prior vaccinations: TD or Tdap: Allergic  Influenza: 2014 Pneumococcal: 2000 Shingles/Zostavax:   Past Medical History  Diagnosis Date  . Dyslipidemia   . Sleep apnea     5 yrs --cpap  does not use   .  Heart murmur   . H/O hiatal hernia   . Arthritis   . Hyperlipidemia   . Hypertension   . GERD (gastroesophageal reflux disease)   . Depression   . Diabetes mellitus     diet controlled  . Hypothyroidism     Past Surgical History  Procedure Laterality Date  . Breast tumor      secondary to fibrocystic disese  . Cholecystctomy    . Left hip replacement    . Abdominal hysterectomy    . Cardiac catheterization      normal dr. Fredrich Birks  . Joint replacement      left hip  . Cholecystectomy    . Total shoulder arthroplasty  11/15/2011    Procedure: TOTAL SHOULDER ARTHROPLASTY;  Surgeon: Senaida Lange, MD;  Location: [facility] OR;  Service: Orthopedics;  Laterality: Left;  left total shoulder arthroplasty    History  Substance  Use Topics  . Smoking status: Former Smoker    Quit date: 08/06/1990  . Smokeless tobacco: Never Used  . Alcohol Use: No   Family History  Problem Relation Age of Onset  . Anesthesia problems Neg Hx   . Hypotension Neg Hx   . Malignant hyperthermia Neg Hx   . Pseudochol deficiency Neg Hx   . Cancer Mother   . Diabetes Mother   . Heart attack Mother   . Heart disease Father   . COPD Son     Risk Factors: Osteoporosis: postmenopausal estrogen deficiency History of fracture in the past year: no  Tobacco History  Substance Use Topics  . Smoking status: Former Smoker    Quit date: 08/06/1990  . Smokeless tobacco: Never Used  . Alcohol Use: No   She does not smoke.  Patient is a former smoker. Are there smokers in your home (other than you)?  No  Alcohol Current alcohol use: none  Caffeine Current caffeine use: denies use  Exercise  Current exercise: housecleaning  Nutrition/Diet Current diet: in general, a "healthy" diet    Cardiac risk factors: advanced age (older than 37 for men, 4 for women) and dyslipidemia.  Depression Screen (Note: if answer to either of the following is "Yes", a more complete depression screening is indicated)   Q1: Over the past two weeks, have you felt down, depressed or hopeless? No  Q2: Over the past two weeks, have you felt little interest or pleasure in doing things? No  Have you lost interest or pleasure in daily life? No  Do you often feel hopeless? No  Do you cry easily over simple problems? No  Activities of Daily Living In your present state of health, do you have any difficulty performing the following activities?:  Driving? No Managing money?  No Feeding yourself? No Getting from bed to chair? No Climbing a flight of stairs? No Preparing food and eating?: No Bathing or showering? No Getting dressed: No Getting to the toilet? No Using the toilet:No Moving around from place to place: No In the past year have you  fallen or had a near fall?:No   Are you sexually active?  No  Do you have more than one partner?  No  Vision Difficulties: No  Hearing Difficulties: No Do you often ask people to speak up or repeat themselves? No Do you experience ringing or noises in your ears?yes Do you have difficulty understanding soft or whispered voices? No  Cognition  Do you feel that you have a problem with memory?No  Do you often misplace  items? No  Do you feel safe at home?  Yes  Advanced directives Does patient have a Health Care Power of Attorney? No Does patient have a Living Will? No   Objective:     Blood pressure 128/56, pulse 48, temperature 98 F (36.7 C), temperature source Temporal, resp. rate 16, height 5' 2.5" (1.588 m), weight 184 lb (83.462 kg). Body mass index is 33.1 kg/(m^2).  General appearance: alert, no distress, WD/WN,  female Cognitive Testing  Alert? Yes  Normal Appearance?Yes  Oriented to person? Yes  Place? Yes   Time? Yes  Recall of three objects?  Yes  Can perform simple calculations? Yes  Displays appropriate judgment?Yes  Can read the correct time from a watch face?Yes  HEENT: normocephalic, sclerae anicteric, TMs pearly, nares patent, no discharge or erythema, pharynx normal Oral cavity: poor dentition, no lesions Neck: supple, no lymphadenopathy, no thyromegaly, no masses Heart: RRR, normal S1, S2, no murmurs Lungs: CTA bilaterally, no wheezes, rhonchi, or rales Abdomen: +bs, soft, non tender, non distended, no masses, no hepatomegaly, no splenomegaly Musculoskeletal: nontender, no swelling, no obvious deformity Extremities: no edema, no cyanosis, no clubbing Pulses: 2+ symmetric, upper and lower extremities, normal cap refill Neurological: alert, oriented x 3, CN2-12 intact, strength normal upper extremities and lower extremities, sensation normal throughout, DTRs 2+ throughout, no cerebellar signs, gait normal SKIN: Exposed area WNL, dry Psychiatric: normal  affect, behavior normal, pleasant  Breast: nontender, no masses or lumps, no skin changes, no nipple discharge or inversion, no axillary lymphadenopathy Gyn: defer  Rectal: defer  AORTA SCAN WNL EKG NSCSPT   Medicare Attestation I have personally reviewed: The patient's medical and social history Their use of alcohol, tobacco or illicit drugs Their current medications and supplements The patient's functional ability including ADLs,fall risks, home safety risks, cognitive, and hearing and visual impairment Diet and physical activities Evidence for depression or mood disorders  The patient's weight, height, BMI, and visual acuity have been recorded in the chart.  I have made referrals, counseling, and provided education to the patient based on review of the above and I have provided the patient with a written personalized care plan for preventive services.     Loree FeeSMITH, Adreona Brand, R, PA-C   02/16/2014

## 2014-02-10 NOTE — Patient Instructions (Signed)
Arthritis, Nonspecific °Arthritis is pain, redness, warmth, or puffiness (inflammation) of a joint. The joint may be stiff or hurt when you move it. One or more joints may be affected. There are many types of arthritis. Your doctor may not know what type you have right away. The most common cause of arthritis is wear and tear on the joint (osteoarthritis). °HOME CARE  °· Only take medicine as told by your doctor. °· Rest the joint as much as possible. °· Raise (elevate) your joint if it is puffy. °· Use crutches if the painful joint is in your leg. °· Drink enough fluids to keep your pee (urine) clear or pale yellow. °· Follow your doctor's diet instructions. °· Use cold packs for very bad joint pain for 10 to 15 minutes every hour. Ask your doctor if it is okay for you to use hot packs. °· Exercise as told by your doctor. °· Take a warm shower if you have stiffness in the morning. °· Move your sore joints throughout the day. °GET HELP RIGHT AWAY IF:  °· You have a fever. °· You have very bad joint pain, puffiness, or redness. °· You have many joints that are painful and puffy. °· You are not getting better with treatment. °· You have very bad back pain or leg weakness. °· You cannot control when you poop (bowel movement) or pee (urinate). °· You do not feel better in 24 hours or are getting worse. °· You are having side effects from your medicine. °MAKE SURE YOU:  °· Understand these instructions. °· Will watch your condition. °· Will get help right away if you are not doing well or get worse. °Document Released: 10/17/2009 Document Revised: 01/22/2012 Document Reviewed: 10/17/2009 °ExitCare® Patient Information ©2015 [employer]. This information is not intended to replace advice given to you by your health care provider. Make sure you discuss any questions you have with your health care provider. ° °

## 2014-02-11 ENCOUNTER — Other Ambulatory Visit: Payer: Self-pay | Admitting: *Deleted

## 2014-02-11 LAB — URINALYSIS, ROUTINE W REFLEX MICROSCOPIC
GLUCOSE, UA: NEGATIVE mg/dL
HGB URINE DIPSTICK: NEGATIVE
Nitrite: NEGATIVE
Protein, ur: NEGATIVE mg/dL
Specific Gravity, Urine: 1.027 (ref 1.005–1.030)
UROBILINOGEN UA: 0.2 mg/dL (ref 0.0–1.0)
pH: 5.5 (ref 5.0–8.0)

## 2014-02-11 LAB — LIPID PANEL
CHOL/HDL RATIO: 6.6 ratio
CHOLESTEROL: 238 mg/dL — AB (ref 0–200)
HDL: 36 mg/dL — AB (ref >39)
LDL Cholesterol: 125 mg/dL — ABNORMAL HIGH (ref 0–99)
TRIGLYCERIDES: 383 mg/dL — AB (ref <150)
VLDL: 77 mg/dL — ABNORMAL HIGH (ref 0–40)

## 2014-02-11 LAB — HEPATIC FUNCTION PANEL
ALBUMIN: 4.4 g/dL (ref 3.5–5.2)
ALK PHOS: 83 U/L (ref 39–117)
ALT: 19 U/L (ref 0–35)
AST: 18 U/L (ref 0–37)
BILIRUBIN TOTAL: 0.3 mg/dL (ref 0.2–1.2)
Bilirubin, Direct: <0.1 mg/dL (ref 0.0–0.3)
TOTAL PROTEIN: 6.4 g/dL (ref 6.0–8.3)

## 2014-02-11 LAB — URINALYSIS, MICROSCOPIC ONLY: Bacteria, UA: NONE SEEN

## 2014-02-11 LAB — BASIC METABOLIC PANEL WITH GFR
BUN: 17 mg/dL (ref 6–23)
CALCIUM: 9.6 mg/dL (ref 8.4–10.5)
CO2: 27 meq/L (ref 19–32)
Chloride: 106 mEq/L (ref 96–112)
Creat: 0.8 mg/dL (ref 0.50–1.10)
GFR, Est African American: 83 mL/min
GFR, Est Non African American: 72 mL/min
GLUCOSE: 86 mg/dL (ref 70–99)
Potassium: 4.4 mEq/L (ref 3.5–5.3)
SODIUM: 141 meq/L (ref 135–145)

## 2014-02-11 LAB — MICROALBUMIN / CREATININE URINE RATIO
Creatinine, Urine: 425.9 mg/dL
MICROALB UR: 2.39 mg/dL — AB (ref 0.00–1.89)
Microalb Creat Ratio: 5.6 mg/g (ref 0.0–30.0)

## 2014-02-11 LAB — MAGNESIUM: Magnesium: 1.6 mg/dL (ref 1.5–2.5)

## 2014-02-11 LAB — HEMOGLOBIN A1C
Hgb A1c MFr Bld: 5.9 % — ABNORMAL HIGH (ref <5.7)
MEAN PLASMA GLUCOSE: 123 mg/dL — AB (ref <117)

## 2014-02-11 LAB — TSH: TSH: 1.101 u[IU]/mL (ref 0.350–4.500)

## 2014-02-11 LAB — INSULIN, FASTING: Insulin fasting, serum: 14 u[IU]/mL (ref 3–28)

## 2014-02-11 MED ORDER — AMLODIPINE-OLMESARTAN 10-40 MG PO TABS: 1.0000 | ORAL_TABLET | Freq: Every day | ORAL | Status: DC

## 2014-02-13 LAB — URINE CULTURE

## 2014-02-14 ENCOUNTER — Other Ambulatory Visit: Payer: Self-pay | Admitting: Emergency Medicine

## 2014-02-14 MED ORDER — CIPROFLOXACIN HCL 250 MG PO TABS: 250.0000 mg | ORAL_TABLET | Freq: Two times a day (BID) | ORAL | Status: AC

## 2014-02-16 NOTE — Addendum Note (Signed)
Addended by: Loree FeeSMITH, Jasemine Nawaz R on: 02/16/2014 01:10 PM   Modules accepted: Orders

## 2014-02-23 ENCOUNTER — Telehealth: Payer: Self-pay | Admitting: *Deleted

## 2014-02-23 MED ORDER — LEVOFLOXACIN 500 MG PO TABS: ORAL_TABLET | ORAL | Status: DC

## 2014-02-23 NOTE — Telephone Encounter (Signed)
Patient called and states she is on Cipro for UTI, but Cipro is not helping her sinus infection.  Per Dr Oneta RackMckeown, stop Cipro and [facility]

## 2014-03-05 ENCOUNTER — Ambulatory Visit (INDEPENDENT_AMBULATORY_CARE_PROVIDER_SITE_OTHER): Payer: Medicare Other | Admitting: Neurology

## 2014-03-05 ENCOUNTER — Encounter: Payer: Self-pay | Admitting: Neurology

## 2014-03-05 VITALS — BP 116/78 | HR 58 | Temp 98.0°F | Resp 18 | Ht 63.0 in | Wt 178.7 lb

## 2014-03-05 DIAGNOSIS — R42 Dizziness and giddiness: Secondary | ICD-10-CM

## 2014-03-05 NOTE — Patient Instructions (Signed)
The dizziness doesn't sound neurologic.  I think the dehydration may be cause of it.  I would follow up with Dr. Verlon SettingKcKeown or Dr. Tenny Crawoss.  Call with questions or concerns.

## 2014-03-05 NOTE — Progress Notes (Signed)
NEUROLOGY CONSULTATION NOTE  Maria BaltimoreCharlotte G Cummings MRN: [id number] DOB: September 09, 1935  Referring provider: Loree FeeMelissa Smith, PA-C Primary care provider: Lucky CowboyWilliam McKeown, MD  Reason for consult:  dizziness  HISTORY OF PRESENT ILLNESS: Maria FairlyCharlotte Cummings is a 78 year old right-handed woman with type II diabetes, vitamin D deficiency, depression, hypothyroidism, sleep apnea, dyslipidemia and CAD who presents for dizziness.  Records reviewed.  For the past several months, she has been experiencing episodes of dizziness. It occurs while she is standing or walking. It usually occurs spontaneously. She describes the dizziness as lightheadedness, sometimes a sense that she is going to pass out. There is no associated spinning sensation. There is no associated nausea, visual changes, slurred speech, headache, shortness of breath, palpitations, or focal numbness or weakness. It lasts briefly, less than 1 minute. It occurs approximately once a week. She does have previous history of vertigo, more consistent with benign positional paroxysmal vertigo, but this is different.  She denies any changes in medications since onset of these symptoms. She does report that she doesn't drink much fluids during the day and feels that she is dehydrated.  Recent note from Sharyn Lulllyssa Smith mentions that she had a negative cardiac workup, but the patient says she has not been seen by cardiology for this. She does have a followup with her cardiologist coming up.  05/05/13 CT Head wo:  chronic small vessel ischemic changes. 05/14/13 US Carotids:  bilateral 50-69% ICA stenosis.  Vertebral arteries show antegrade flow with normal waveform.  PAST MEDICAL HISTORY: Past Medical History  Diagnosis Date  . Dyslipidemia   . Sleep apnea     5 yrs --cpap  does not use   . Heart murmur   . H/O hiatal hernia   . Arthritis   . Hyperlipidemia   . Hypertension   . GERD (gastroesophageal reflux disease)   . Depression   . Diabetes mellitus    diet controlled  . Hypothyroidism     PAST SURGICAL HISTORY: Past Surgical History  Procedure Laterality Date  . Breast tumor      secondary to fibrocystic disese  . Cholecystctomy    . Left hip replacement    . Abdominal hysterectomy    . Cardiac catheterization      normal dr. Fredrich Birkssoloman  . Joint replacement      left hip  . Cholecystectomy    . Total shoulder arthroplasty  11/15/2011    Procedure: TOTAL SHOULDER ARTHROPLASTY;  Surgeon: Senaida LangeKevin M Supple, MD;  Location: [facility] OR;  Service: Orthopedics;  Laterality: Left;  left total shoulder arthroplasty    MEDICATIONS: Current Outpatient Prescriptions on File Prior to Visit  Medication Sig Dispense Refill  . acyclovir (ZOVIRAX) 800 MG tablet Take 800 mg by mouth as needed. outbreaks      . albuterol (PROVENTIL HFA;VENTOLIN HFA) 108 (90 BASE) MCG/ACT inhaler Inhale 2 puffs into the lungs every 6 (six) hours as needed for wheezing.      Marland Kitchen. ALPRAZolam (XANAX) 0.25 MG tablet Take 0.25 mg by mouth daily as needed. Anxiety As needed      . amLODipine-olmesartan (AZOR) 10-40 MG per tablet Take 1 tablet by mouth daily.  14 tablet  0  . buPROPion (WELLBUTRIN SR) 150 MG 12 hr tablet Take 150 mg by mouth daily. 1 tab daily      . DULoxetine (CYMBALTA) 60 MG capsule Take 60 mg by mouth daily.      Marland Kitchen. HYDROcodone-acetaminophen (NORCO) 10-325 MG per tablet Take 1 tablet by mouth  every 6 (six) hours as needed.      Marland Kitchen levofloxacin (LEVAQUIN) 500 MG tablet Take 1 tab daily pc for infection  7 tablet  0  . levothyroxine (SYNTHROID, LEVOTHROID) 175 MCG tablet TAKE ONE TABLET BY MOUTH EVERY DAY  90 tablet  0  . MAGNESIUM PO Take 1 tablet by mouth daily.      . meclizine (ANTIVERT) 25 MG tablet Take 25 mg by mouth 3 (three) times daily as needed for dizziness. For dizziness      . modafinil (PROVIGIL) 200 MG tablet TAKE ONE TABLET BY MOUTH TWICE DAILY  180 tablet  1  . omeprazole (PRILOSEC) 40 MG capsule Take 1 capsule (40 mg total) by mouth daily. For  heartburn  30 capsule  99  . prochlorperazine (COMPAZINE) 5 MG tablet Take 1 tablet 3 x daily before meals for nausea  90 tablet  0  . rosuvastatin (CRESTOR) 20 MG tablet Take 1 tablet (20 mg total) by mouth daily.  30 tablet  12   No current facility-administered medications on file prior to visit.    ALLERGIES: Allergies  Allergen Reactions  . Tetanus Toxoids Anaphylaxis  . Iohexol      Desc: THROAT SWELLED/ PROBLEMS BREATHING WITH IVP DYE 10 PLUS YEARS AGO   . Lipitor [Atorvastatin]     Myalgias   . Prednisone     Insomnia, increased depression  . Seldane [Terfenadine]     Hair loss  . Septra [Sulfamethoxazole-Tmp Ds]     FAMILY HISTORY: Family History  Problem Relation Age of Onset  . Anesthesia problems Neg Hx   . Hypotension Neg Hx   . Malignant hyperthermia Neg Hx   . Pseudochol deficiency Neg Hx   . Cancer Mother   . Diabetes Mother   . Heart attack Mother   . Heart disease Father   . COPD Son     SOCIAL HISTORY: History   Social History  . Marital Status: Married    Spouse Name: N/A    Number of Children: N/A  . Years of Education: N/A   Occupational History  . Not on file.   Social History Main Topics  . Smoking status: Former Smoker    Quit date: 08/06/1990  . Smokeless tobacco: Never Used  . Alcohol Use: No  . Drug Use: No  . Sexual Activity: No   Other Topics Concern  . Not on file   Social History Narrative   Married   2 children   Retired from [employer] , no longer works. She does not use alcohol or tobacco    REVIEW OF SYSTEMS: Constitutional: No fevers, chills, or sweats, no generalized fatigue, change in appetite Eyes: No visual changes, double vision, eye pain Ear, nose and throat: No hearing loss, ear pain, nasal congestion, sore throat Cardiovascular: No chest pain, palpitations Respiratory:  No shortness of breath at rest or with exertion, wheezes GastrointestinaI: No nausea, vomiting, diarrhea, abdominal pain, fecal  incontinence Genitourinary:  No dysuria, urinary retention or frequency Musculoskeletal:  No neck pain, back pain Integumentary: No rash, pruritus, skin lesions Neurological: as above Psychiatric: No depression, insomnia, anxiety Endocrine: No palpitations, fatigue, diaphoresis, mood swings, change in appetite, change in weight, increased thirst Hematologic/Lymphatic:  No anemia, purpura, petechiae. Allergic/Immunologic: no itchy/runny eyes, nasal congestion, recent allergic reactions, rashes  PHYSICAL EXAM: Filed Vitals:   03/05/14 1021  BP: 116/78  Pulse: 58  Temp: 98 F (36.7 C)  Resp: 18   General: No acute distress Head:  Normocephalic/atraumatic Neck: supple, no paraspinal tenderness, full range of motion Back: No paraspinal tenderness Heart: regular rate and rhythm Lungs: Clear to auscultation bilaterally. Vascular: No carotid bruits. Neurological Exam: Mental status: alert and oriented to person, place, and time, recent and remote memory intact, fund of knowledge intact, attention and concentration intact, speech fluent and not dysarthric, language intact. Cranial nerves: CN I: not tested CN II: pupils equal, round and reactive to light, visual fields intact, fundi unremarkable, without vessel changes, exudates, hemorrhages or papilledema. CN III, IV, VI:  full range of motion, no nystagmus, no ptosis CN V: facial sensation intact CN VII: upper and lower face symmetric CN VIII: hearing intact CN IX, X: gag intact, uvula midline CN XI: sternocleidomastoid and trapezius muscles intact CN XII: tongue midline Bulk & Tone: normal, no fasciculations. Motor: 5 out of 5 throughout Sensation: Temperature and vibration intact Deep Tendon Reflexes: 2+ throughout except absent in the ankles, toes downgoing Finger to nose testing: No dysmetria Heel to shin: No dysmetria Gait: Normal station and stride. Able to turn. Able to walk in tandem although with hesitancy. Romberg  negative.  IMPRESSION: Episodes of lightheadedness or presyncope.  PLAN: Symptoms do not suggest a neurological etiology. There is no true vertigo or vertebrobasilar-type symptoms to suggest an intracranial etiology. May be to 2 dehydration. Recommend a more systemic workup and to followup with her cardiologist.  Follow up as needed.  45 minutes that with the patient, over 50% spent discussing diagnoses and possible etiologies, as well as making recommendations. Thank you for allowing me to take part in the care of this patient.  Shon Millet, DO  CC:  Lucky Cowboy, MD

## 2014-03-11 DIAGNOSIS — M722 Plantar fascial fibromatosis: Secondary | ICD-10-CM | POA: Diagnosis not present

## 2014-03-17 DIAGNOSIS — Z961 Presence of intraocular lens: Secondary | ICD-10-CM | POA: Diagnosis not present

## 2014-03-18 ENCOUNTER — Ambulatory Visit: Payer: Self-pay

## 2014-03-21 NOTE — Progress Notes (Signed)
HPI Patient is a 78 year old with a history of CAD  She has a history of mild to moderate CAD (Cath 2009 after abnormmal perfusion test. 50% mid/distal LAD; 50% L Cx), 60% R renal artery stenosis, OSA (not using), DM, HTN, Dyslpidemia. She is also seen by Dr. Oneta RackMcKeown. I saw her in 2014. She denies CP  Has occasional dizziness about 1 to 2 times per week.  One time today. Breathing is stable    Allergies  Allergen Reactions  . Iodinated Diagnostic Agents Anaphylaxis  . Iohexol Anaphylaxis     Desc: THROAT SWELLED/ PROBLEMS BREATHING WITH IVP DYE 10 PLUS YEARS AGO  Desc: THROAT SWELLED/ PROBLEMS BREATHING WITH IVP DYE 10 PLUS YEARS AGO   . Tetanus Toxoid Adsorbed Anaphylaxis  . Tetanus Toxoids Anaphylaxis  . Lipitor [Atorvastatin]     Other reaction(s): Myalgias (intolerance) Myalgias Myalgias   . Prednisone     Insomnia, increased depression  . Seldane [Terfenadine]     Other reaction(s): Other (See Comments) Hair loss Hair loss  . Septra [Sulfamethoxazole-Tmp Ds]   . Tetanus Toxoid Swelling    Current Outpatient Prescriptions  Medication Sig Dispense Refill  . acyclovir (ZOVIRAX) 800 MG tablet Take 800 mg by mouth as needed. outbreaks      . albuterol (PROVENTIL HFA;VENTOLIN HFA) 108 (90 BASE) MCG/ACT inhaler Inhale 2 puffs into the lungs every 6 (six) hours as needed for wheezing.      Marland Kitchen. ALPRAZolam (XANAX) 0.25 MG tablet Take 0.25 mg by mouth daily as needed. Anxiety As needed      . buPROPion (WELLBUTRIN SR) 150 MG 12 hr tablet Take 150 mg by mouth daily. 1 tab daily      . DULoxetine (CYMBALTA) 60 MG capsule Take 40 mg by mouth daily.       Marland Kitchen. escitalopram (LEXAPRO) 10 MG tablet Take 10 mg by mouth daily.      Marland Kitchen. HYDROcodone-acetaminophen (NORCO) 10-325 MG per tablet Take 1 tablet by mouth every 6 (six) hours as needed.      Marland Kitchen. levofloxacin (LEVAQUIN) 500 MG tablet Take 1 tab daily pc for infection  7 tablet  0  . levothyroxine (SYNTHROID, LEVOTHROID) 175 MCG tablet TAKE ONE  TABLET BY MOUTH EVERY DAY  90 tablet  0  . MAGNESIUM PO Take 1 tablet by mouth daily.      . meclizine (ANTIVERT) 25 MG tablet Take 25 mg by mouth 3 (three) times daily as needed for dizziness. For dizziness      . modafinil (PROVIGIL) 200 MG tablet TAKE ONE TABLET BY MOUTH TWICE DAILY  180 tablet  1  . omeprazole (PRILOSEC) 40 MG capsule Take 1 capsule (40 mg total) by mouth daily. For heartburn  30 capsule  99  . prochlorperazine (COMPAZINE) 5 MG tablet Take 1 tablet 3 x daily before meals for nausea  90 tablet  0  . rosuvastatin (CRESTOR) 20 MG tablet Take 1 tablet (20 mg total) by mouth daily.  90 tablet  3  . amLODipine (NORVASC) 5 MG tablet Take 1 tablet (5 mg total) by mouth daily.  180 tablet  3  . lisinopril (PRINIVIL,ZESTRIL) 20 MG tablet Take 1 tablet (20 mg total) by mouth daily.  90 tablet  3   No current facility-administered medications for this visit.    Past Medical History  Diagnosis Date  . Dyslipidemia   . Sleep apnea     5 yrs --cpap  does not use   . Heart murmur   .  H/O hiatal hernia   . Arthritis   . Hyperlipidemia   . Hypertension   . GERD (gastroesophageal reflux disease)   . Depression   . Diabetes mellitus     diet controlled  . Hypothyroidism     Past Surgical History  Procedure Laterality Date  . Breast tumor      secondary to fibrocystic disese  . Cholecystctomy    . Left hip replacement    . Abdominal hysterectomy    . Cardiac catheterization      normal dr. Fredrich Birks  . Joint replacement      left hip  . Cholecystectomy    . Total shoulder arthroplasty  11/15/2011    Procedure: TOTAL SHOULDER ARTHROPLASTY;  Surgeon: Senaida Lange, MD;  Location: [facility] OR;  Service: Orthopedics;  Laterality: Left;  left total shoulder arthroplasty    Family History  Problem Relation Age of Onset  . Anesthesia problems Neg Hx   . Hypotension Neg Hx   . Malignant hyperthermia Neg Hx   . Pseudochol deficiency Neg Hx   . Cancer Mother   . Diabetes Mother    . Heart attack Mother   . Heart disease Father   . COPD Son     History   Social History  . Marital Status: Married    Spouse Name: N/A    Number of Children: N/A  . Years of Education: N/A   Occupational History  . Not on file.   Social History Main Topics  . Smoking status: Former Smoker    Quit date: 08/06/1990  . Smokeless tobacco: Never Used  . Alcohol Use: No  . Drug Use: No  . Sexual Activity: No   Other Topics Concern  . Not on file   Social History Narrative   Married   2 children   Retired from [employer] , no longer works. She does not use alcohol or tobacco    Review of Systems:  All systems reviewed.  They are negative to the above problem except as previously stated.  Vital Signs: BP 135/60  Pulse 55  Ht 5\' 3"  (1.6 m)  Wt 179 lb (81.194 kg)  BMI 31.72 kg/m2  SpO2 94%  Physical Exam Patient is in NAD HEENT:  Normocephalic, atraumatic. EOMI, PERRLA.  Neck: JVP is normal.  No bruits.  Lungs: clear to auscultation. No rales no wheezes.  Heart: Regular rate and rhythm. Normal S1, S2. No S3.   No significant murmurs. PMI not displaced.  Abdomen:  Supple, nontender. Normal bowel sounds. No masses. No hepatomegaly.  Extremities:   Good distal pulses throughout. No lower extremity edema.  Musculoskeletal :moving all extremities.  Neuro:   alert and oriented x3.  CN II-XII grossly intact.  EKG:  SR 79  Nonspecific ST T wave changes Assessment and Plan:  1.  CAD No symptoms to sugg ischemia    2.  HTN BP has been high in past  Cant afford Azor  With dizzines though I would cut amdlodipine to 5 mg and lisinopril to 20 F/U in 4 wks    3.  HL  Crestor 20 mg

## 2014-03-22 ENCOUNTER — Ambulatory Visit (INDEPENDENT_AMBULATORY_CARE_PROVIDER_SITE_OTHER): Payer: Medicare Other | Admitting: Internal Medicine

## 2014-03-22 ENCOUNTER — Encounter: Payer: Self-pay | Admitting: Internal Medicine

## 2014-03-22 VITALS — BP 135/60 | HR 55 | Ht 63.0 in | Wt 179.0 lb

## 2014-03-22 DIAGNOSIS — E7800 Pure hypercholesterolemia, unspecified: Secondary | ICD-10-CM

## 2014-03-22 MED ORDER — LISINOPRIL 20 MG PO TABS: 20.0000 mg | ORAL_TABLET | Freq: Every day | ORAL | Status: DC

## 2014-03-22 MED ORDER — ROSUVASTATIN CALCIUM 20 MG PO TABS: 20.0000 mg | ORAL_TABLET | Freq: Every day | ORAL | Status: DC

## 2014-03-22 MED ORDER — AMLODIPINE BESYLATE 5 MG PO TABS: 5.0000 mg | ORAL_TABLET | Freq: Every day | ORAL | Status: DC

## 2014-03-22 NOTE — Patient Instructions (Signed)
Your physician has recommended you make the following change in your medication:  1.) stop AZOR 2.) begin amlodipine 5 mg daily 3.) begin lisinopril 20 mg daily 4.) begin Crestor 20 mg daily  Your physician recommends that you schedule a follow-up appointment in: 4-6 weeks with Dr. Tenny Crawoss.

## 2014-03-29 DIAGNOSIS — G894 Chronic pain syndrome: Secondary | ICD-10-CM | POA: Diagnosis not present

## 2014-03-29 DIAGNOSIS — M26609 Unspecified temporomandibular joint disorder, unspecified side: Secondary | ICD-10-CM | POA: Diagnosis not present

## 2014-04-21 ENCOUNTER — Other Ambulatory Visit: Payer: Self-pay | Admitting: Physician Assistant

## 2014-04-26 DIAGNOSIS — M722 Plantar fascial fibromatosis: Secondary | ICD-10-CM | POA: Diagnosis not present

## 2014-05-05 NOTE — Progress Notes (Signed)
HPI Patient is a 78 year old with a history of CAD  She has a history of mild to moderate CAD (Cath 2009 after abnormmal perfusion test. 50% mid/distal LAD; 50% L Cx), 60% R renal artery stenosis, OSA (not using), DM, HTN, Dyslpidemia. She is also seen by Dr. Oneta RackMcKeown.  Patient was last in clinic in Aug  At that time I switched her to amlodipine 5 and lisinopril 20  (cost plus dizzy) Patient feeling good  And, meds cheap Allergies  Allergen Reactions  . Iodinated Diagnostic Agents Anaphylaxis  . Iohexol Anaphylaxis     Desc: THROAT SWELLED/ PROBLEMS BREATHING WITH IVP DYE 10 PLUS YEARS AGO  Desc: THROAT SWELLED/ PROBLEMS BREATHING WITH IVP DYE 10 PLUS YEARS AGO   . Tetanus Toxoid Adsorbed Anaphylaxis  . Tetanus Toxoids Anaphylaxis  . Lipitor [Atorvastatin]     Other reaction(s): Myalgias (intolerance) Myalgias Myalgias   . Prednisone     Insomnia, increased depression  . Seldane [Terfenadine]     Other reaction(s): Other (See Comments) Hair loss Hair loss  . Septra [Sulfamethoxazole-Tmp Ds]   . Tetanus Toxoid Swelling    Current Outpatient Prescriptions  Medication Sig Dispense Refill  . acyclovir (ZOVIRAX) 800 MG tablet Take 800 mg by mouth as needed. outbreaks      . albuterol (PROVENTIL HFA;VENTOLIN HFA) 108 (90 BASE) MCG/ACT inhaler Inhale 2 puffs into the lungs every 6 (six) hours as needed for wheezing.      Marland Kitchen. ALPRAZolam (XANAX) 0.25 MG tablet Take 0.25 mg by mouth daily as needed. Anxiety As needed      . amLODipine (NORVASC) 5 MG tablet Take 1 tablet (5 mg total) by mouth daily.  180 tablet  3  . buPROPion (WELLBUTRIN SR) 150 MG 12 hr tablet Take 150 mg by mouth daily. 1 tab daily      . Cholecalciferol (VITAMIN D PO) Take by mouth daily.      . DULoxetine (CYMBALTA) 60 MG capsule Take 40 mg by mouth daily.       Marland Kitchen. escitalopram (LEXAPRO) 10 MG tablet Take 10 mg by mouth daily.      Marland Kitchen. HYDROcodone-acetaminophen (NORCO) 10-325 MG per tablet Take 1 tablet by mouth every 6  (six) hours as needed.      Marland Kitchen. levofloxacin (LEVAQUIN) 500 MG tablet as needed (for infections). Take 1 tab daily pc for infection      . levothyroxine (SYNTHROID, LEVOTHROID) 175 MCG tablet TAKE ONE TABLET BY MOUTH ONCE DAILY  90 tablet  0  . lisinopril (PRINIVIL,ZESTRIL) 20 MG tablet Take 1 tablet (20 mg total) by mouth daily.  90 tablet  3  . MAGNESIUM PO Take 1 tablet by mouth daily.      . meclizine (ANTIVERT) 25 MG tablet Take 25 mg by mouth 3 (three) times daily as needed for dizziness. For dizziness      . modafinil (PROVIGIL) 200 MG tablet TAKE ONE TABLET BY MOUTH TWICE DAILY  180 tablet  1  . omeprazole (PRILOSEC) 40 MG capsule Take 1 capsule (40 mg total) by mouth daily. For heartburn  30 capsule  99  . prochlorperazine (COMPAZINE) 5 MG tablet as needed for refractory nausea / vomiting. Take 1 tablet 3 x daily before meals for nausea      . rosuvastatin (CRESTOR) 20 MG tablet Take 1 tablet (20 mg total) by mouth daily.  90 tablet  3   No current facility-administered medications for this visit.    Past Medical History  Diagnosis Date  . Dyslipidemia   . Sleep apnea     5 yrs --cpap  does not use   . Heart murmur   . H/O hiatal hernia   . Arthritis   . Hyperlipidemia   . Hypertension   . GERD (gastroesophageal reflux disease)   . Depression   . Diabetes mellitus     diet controlled  . Hypothyroidism     Past Surgical History  Procedure Laterality Date  . Breast tumor      secondary to fibrocystic disese  . Cholecystctomy    . Left hip replacement    . Abdominal hysterectomy    . Cardiac catheterization      normal dr. Fredrich Birks  . Joint replacement      left hip  . Cholecystectomy    . Total shoulder arthroplasty  11/15/2011    Procedure: TOTAL SHOULDER ARTHROPLASTY;  Surgeon: Senaida Lange, MD;  Location: [facility] OR;  Service: Orthopedics;  Laterality: Left;  left total shoulder arthroplasty    Family History  Problem Relation Age of Onset  . Anesthesia problems  Neg Hx   . Hypotension Neg Hx   . Malignant hyperthermia Neg Hx   . Pseudochol deficiency Neg Hx   . Cancer Mother   . Diabetes Mother   . Heart attack Mother   . Heart disease Father   . COPD Son     History   Social History  . Marital Status: Married    Spouse Name: N/A    Number of Children: N/A  . Years of Education: N/A   Occupational History  . Not on file.   Social History Main Topics  . Smoking status: Former Smoker    Quit date: 08/06/1990  . Smokeless tobacco: Never Used  . Alcohol Use: No  . Drug Use: No  . Sexual Activity: No   Other Topics Concern  . Not on file   Social History Narrative   Married   2 children   Retired from [employer] , no longer works. She does not use alcohol or tobacco    Review of Systems:  All systems reviewed.  They are negative to the above problem except as previously stated.  Vital Signs: BP 136/78  Pulse 59  Ht 5\' 3"  (1.6 m)  Wt 176 lb (79.833 kg)  BMI 31.18 kg/m2  SpO2 95%  Physical Exam Patient is in NAD HEENT:  Normocephalic, atraumatic. EOMI, PERRLA.  Neck: JVP is normal.  No bruits.  Lungs: clear to auscultation. No rales no wheezes.  Heart: Regular rate and rhythm. Normal S1, S2. No S3.   No significant murmurs. PMI not displaced.  Abdomen:  Supple, nontender. Normal bowel sounds. No masses. No hepatomegaly.  Extremities:   Good distal pulses throughout. No lower extremity edema.  Musculoskeletal :moving all extremities.  Neuro:   alert and oriented x3.  CN II-XII grossly intact.  EKG:  SR 79  Nonspecific ST T wave changes Assessment and Plan:  1.  CAD No symptoms to sugg ischemia    2.  HTN BP good.  3.  HL  Keep on same meds  F/U late spring.

## 2014-05-06 ENCOUNTER — Ambulatory Visit (INDEPENDENT_AMBULATORY_CARE_PROVIDER_SITE_OTHER): Payer: Medicare Other | Admitting: Internal Medicine

## 2014-05-06 ENCOUNTER — Encounter: Payer: Self-pay | Admitting: Internal Medicine

## 2014-05-06 VITALS — BP 136/78 | HR 59 | Ht 63.0 in | Wt 176.0 lb

## 2014-05-06 DIAGNOSIS — G4733 Obstructive sleep apnea (adult) (pediatric): Secondary | ICD-10-CM | POA: Insufficient documentation

## 2014-05-06 DIAGNOSIS — I10 Essential (primary) hypertension: Secondary | ICD-10-CM

## 2014-05-06 DIAGNOSIS — I2510 Atherosclerotic heart disease of native coronary artery without angina pectoris: Secondary | ICD-10-CM | POA: Diagnosis not present

## 2014-05-06 DIAGNOSIS — I779 Disorder of arteries and arterioles, unspecified: Secondary | ICD-10-CM | POA: Insufficient documentation

## 2014-05-06 DIAGNOSIS — I739 Peripheral vascular disease, unspecified: Secondary | ICD-10-CM

## 2014-05-06 DIAGNOSIS — E785 Hyperlipidemia, unspecified: Secondary | ICD-10-CM

## 2014-05-06 NOTE — Patient Instructions (Signed)
Your physician recommends that you continue on your current medications as directed. Please refer to the Current Medication list given to you today. Your physician wants you to follow-up in: April 2016 WITH DR ROSS.  You will receive a reminder letter in the mail two months in advance. If you don't receive a letter, please call our office to schedule the follow-up appointment.

## 2014-05-23 DIAGNOSIS — Z1231 Encounter for screening mammogram for malignant neoplasm of breast: Secondary | ICD-10-CM

## 2014-05-24 DIAGNOSIS — M722 Plantar fascial fibromatosis: Secondary | ICD-10-CM | POA: Diagnosis not present

## 2014-06-09 DIAGNOSIS — Z8262 Family history of osteoporosis: Secondary | ICD-10-CM | POA: Diagnosis not present

## 2014-06-09 DIAGNOSIS — Z803 Family history of malignant neoplasm of breast: Secondary | ICD-10-CM | POA: Diagnosis not present

## 2014-06-09 DIAGNOSIS — M810 Age-related osteoporosis without current pathological fracture: Secondary | ICD-10-CM | POA: Diagnosis not present

## 2014-06-09 DIAGNOSIS — Z1231 Encounter for screening mammogram for malignant neoplasm of breast: Secondary | ICD-10-CM | POA: Diagnosis not present

## 2014-06-09 DIAGNOSIS — E559 Vitamin D deficiency, unspecified: Secondary | ICD-10-CM | POA: Diagnosis not present

## 2014-06-26 ENCOUNTER — Encounter: Payer: Self-pay | Admitting: *Deleted

## 2014-06-26 DIAGNOSIS — M810 Age-related osteoporosis without current pathological fracture: Secondary | ICD-10-CM

## 2014-06-28 DIAGNOSIS — M722 Plantar fascial fibromatosis: Secondary | ICD-10-CM | POA: Diagnosis not present

## 2014-07-06 ENCOUNTER — Encounter: Payer: Self-pay | Admitting: Physician Assistant

## 2014-07-06 ENCOUNTER — Ambulatory Visit (INDEPENDENT_AMBULATORY_CARE_PROVIDER_SITE_OTHER): Payer: Medicare Other | Admitting: Physician Assistant

## 2014-07-06 ENCOUNTER — Ambulatory Visit: Payer: Self-pay | Admitting: Physician Assistant

## 2014-07-06 VITALS — BP 166/64 | HR 56 | Temp 98.4°F | Resp 16 | Ht 63.5 in | Wt 176.0 lb

## 2014-07-06 DIAGNOSIS — E785 Hyperlipidemia, unspecified: Secondary | ICD-10-CM | POA: Diagnosis not present

## 2014-07-06 DIAGNOSIS — R7303 Prediabetes: Secondary | ICD-10-CM

## 2014-07-06 DIAGNOSIS — Z79899 Other long term (current) drug therapy: Secondary | ICD-10-CM

## 2014-07-06 DIAGNOSIS — I10 Essential (primary) hypertension: Secondary | ICD-10-CM

## 2014-07-06 DIAGNOSIS — E039 Hypothyroidism, unspecified: Secondary | ICD-10-CM | POA: Diagnosis not present

## 2014-07-06 DIAGNOSIS — R7309 Other abnormal glucose: Secondary | ICD-10-CM | POA: Diagnosis not present

## 2014-07-06 DIAGNOSIS — E559 Vitamin D deficiency, unspecified: Secondary | ICD-10-CM

## 2014-07-06 NOTE — Patient Instructions (Addendum)
Continue medications as prescribed. I will call you with lab results.  Please follow up in 3 months.  GIVE PT FOOD CHOICE LISTS FOR MEAL PLANNING  1)The amount of food you eat is important  -Too much can increase glucose levels and cause you to gain weight (which can  also increase glucose levels.  -Too little can decrease glucose level to unsafe levels (<70) 2)Eat meals and snacks at the same time each day to help your diabetes medication help you.  If you eat at different times each day, then the medication will not be as effective. 3)Do NOT skip meals or eat meals later than usual.  If you skip meals, then your glucose level can go low (<70).  Eating meals later than usual will not help the medication work effectively. 4) Amount of carbohydrates (carbs) per meal  -Breakfast- 30-45 grams of carbs  -Lunch and Dinner- 45-60 grams of carbs  -Snacks- 15-30 grams of carbs 5)Low fat foods have no more than 3 grams of fat per serving.  -Saturated- look for less than 1 gram per serving  -Trans Fat- look for 0 grams per serving 6)Exercise at least 120 minutes per week  Exercise Benefits:   -Lower LDL (Bad cholesterol)   -Lower blood pressure   -Increase HDL (Good cholesterol)   -Strengthen heart, lungs and muscles   -Burn calories and relieve stress   -Sleep better and help you feel better overall  To lower your risk of heart disease, limit your intake of saturated fat and trans fat as much as possible. THE GOOD WHAT IT DOES WHERE IT'S FOUND  MONOUNSATURATED FAT Lowers LDL and maybe raises HDL cholesterol Canola oil, olives, olive oil, peanuts, peanut oil, avocados, nuts  POLYUNSATURATED FAT Lowers LDL cholesterol Corn, safflower, sunflower and soybean oils, nuts, seeds  OMEGA-3 FATTY ACIDS Lowers triglycerides (blood fats) and blood pressure Salmon, mackerel, herring, sardines, flax seed, flaxseed oil, walnuts, soybean oil  THE BAD WHAT IT DOES WHERE IT'S FOUND  SATURATED FAT Raises LDL  (bad) cholesterol Butter, shortening, lard, red meat, cheese, whole milk, ice cream, coconut and palm oils  TRANS FAT Raises LDL cholesterol, lowers HDL (good) cholesterol Fried foods, some stick margarines, some cookies and crackers (look for hydrogenated fat on the ingredient list)  CHOLESTEROL FROM FOOD Too much may raise cholesterol levels Meat, poultry, seafood, eggs, milk, cheese, yogurt, butter   Plate Method (How much food of each food group) 1) Fill one half of your plate with nonstarchy vegetables: lettuce, broccoli, green beans, spinach, carrots or peppers. 2) Fill one quarter with protein: chicken, [place], fish, lean meat, eggs or tofu. 3) Fill one quarter with a nutritious carbohydrate food: brown rice, whole-wheat pasta, whole-wheat bread, peas, or corn.  Choose whole-wheat carbs for extra nutrition.  Controlling carbs helps you control your blood glucose. 4) Include a small piece of fruit at each meal, as well as 8 ounces of lowfat milk or yogurt. 5) Add 1-2 teaspoons of heart-healthy fat, such as olive or canola oil, trans fat-free margarine, avocado, nuts or seeds.

## 2014-07-06 NOTE — Progress Notes (Signed)
Assessment and Plan:  1. Essential hypertension Continue Norvasc and Lisinopril as prescribed.  Monitor blood pressure at home.  Reminder to go to the ER if any CP, SOB, nausea, dizziness, severe HA, changes vision/speech, left arm numbness and tingling and jaw pain. - CBC with Differential - BASIC METABOLIC PANEL WITH GFR - Hepatic function panel  2. Hypothyroidism, unspecified hypothyroidism type Check TSH level, continue medications the same, reminded to take on an empty stomach 30-60mins before food.  - TSH  3. Prediabetes Please continue to follow recommended diet and exercise.  Check A1C and insulin level. - Hemoglobin A1c - Insulin, fasting  4. Dyslipidemia Continue Crestor as prescribed.  Please follow recommended diet and exercise.  Check cholesterol. - Lipid panel  5. Encounter for long-term (current) use of medications Will monitor kidney and liver function.  6. Vitamin D deficiency Continue Vitamin D OTC.  Will check level at next appt.  Continue diet and meds as discussed. Further disposition pending results of labs.  Discussed medication effects and SE's.  Pt agreed to treatment plan. Please keep your follow up appt on 10/05/14.   HPI 78 y.o. female  presents for 3 month follow up with hypertension, dyslipidemia, prediabetes, hypothyroidsim and vitamin D.  Her blood pressure has been controlled at home, today their BP is BP: (!) 166/64 mmHg She does not workout. She denies chest pain, shortness of breath, dizziness. Patient is on Norvasc 5mg  and Lisinopril 20mg  daily.  She is on cholesterol medication (Crestor 20mg -Patient states she does have a hard time remembering to take this medication) and denies myalgias. Her cholesterol is not at goal. The cholesterol last visit was:   Lab Results  Component Value Date   CHOL 238* 02/10/2014   HDL 36* 02/10/2014   LDLCALC 125* 02/10/2014   TRIG 383* 02/10/2014   CHOLHDL 6.6 02/10/2014   She has not been working on  diet and exercise for prediabetes, and denies increased appetite, polydipsia and polyuria. Last A1C in the office was:  Lab Results  Component Value Date   HGBA1C 5.9* 02/10/2014   Currently manages prediabetes with?   Diet   Number of meals per day?  1 meal and snacks throughout day  Shrimp in meal, does not like fried foods, hamburger steak  Snacks?  Cookies from [employer], jello  Beverages? Diet pepsi and drinks unsweetened tea with splenda  Patient is on Vitamin D supplement.- Does not remember how many IU. Lab Results  CMechOtiliShaune PollackSouMechOMechOtiliShaune PollackUniversityMechOtiliShaune PoMechOtiliShaune PollackAtokaMechOtiliShaune PollackArdmore Regional SurgMechOtiliShaune PollackFMechOtiliShaune PollackBaptist HospiSsm HeaMechOtiliShauneMechMechOtiliShaune MechOtiliShaune PollackWest Hills SurgicWaldorf Endoscopy CeDonne HMarland KitchenaTharon AquasdCenter OfCopper Queen Community HospDonne HMarland Kit<[id number] She reports hot flashes, depression and anxiety.  She denies weight gain/loss, fatigue, difficulty swallowing, voice changes, palpitations, hot/cold intolerance, skin/hair texture changes, diarrhea, constipation and tremors.  Current Medications:  Current Outpatient Prescriptions on File Prior to Visit  Medication Sig Dispense Refill  . acyclovir (ZOVIRAX) 800 MG tablet Take 800 mg by mouth as needed. outbreaks    . albuterol (PROVENTIL HFA;VENTOLIN HFA) 108 (90 BASE) MCG/ACT inhaler Inhale 2 puffs into the lungs every 6 (six) hours as needed for wheezing.    . ALPRAZolam (XANAX) 0.25 MG tablet Take 0.25 mg by mouth daily as needed. Anxiety As needed    . amLODipine (NORVASC) 5 MG tablet Take 1 tablet (5 mg total) by mouth daily. 180 tablet 3  . buPROPion (WELLBUTRIN SR) 150 MG 12 hr tablet Take 150 mg  by mouth daily. 1 tab daily    . Cholecalciferol (VITAMIN D PO) Take by mouth daily.    . DULoxetine (CYMBALTA) 60 MG capsule Take 40 mg by mouth daily.     Marland Kitchen escitalopram (LEXAPRO) 10 MG tablet Take 10 mg by mouth daily.    Marland Kitchen HYDROcodone-acetaminophen (NORCO) 10-325 MG per tablet Take 1 tablet by mouth every 6 (six) hours as needed.    Marland Kitchen levothyroxine (SYNTHROID, LEVOTHROID) 175  MCG tablet TAKE ONE TABLET BY MOUTH ONCE DAILY 90 tablet 0  . lisinopril (PRINIVIL,ZESTRIL) 20 MG tablet Take 1 tablet (20 mg total) by mouth daily. 90 tablet 3  . MAGNESIUM PO Take 1 tablet by mouth daily.    . meclizine (ANTIVERT) 25 MG tablet Take 25 mg by mouth 3 (three) times daily as needed for dizziness. For dizziness    . modafinil (PROVIGIL) 200 MG tablet TAKE ONE TABLET BY MOUTH TWICE DAILY 180 tablet 1  . omeprazole (PRILOSEC) 40 MG capsule Take 1 capsule (40 mg total) by mouth daily. For heartburn 30 capsule 99  . prochlorperazine (COMPAZINE) 5 MG tablet as needed for refractory nausea / vomiting. Take 1 tablet 3 x daily before meals for nausea    . rosuvastatin (CRESTOR) 20 MG tablet Take 1 tablet (20 mg total) by mouth daily. 90 tablet 3   No current facility-administered medications on file prior to visit.   Medical History:  Past Medical History  Diagnosis Date  . Dyslipidemia   . Sleep apnea     5 yrs --cpap  does not use   . Heart murmur   . H/O hiatal hernia   . Arthritis   . Hyperlipidemia   . Hypertension   . GERD (gastroesophageal reflux disease)   . Depression   . Diabetes mellitus     diet controlled  . Hypothyroidism    Allergies:  Allergies  Allergen Reactions  . Iodinated Diagnostic Agents Anaphylaxis  . Iohexol Anaphylaxis     Desc: THROAT SWELLED/ PROBLEMS BREATHING WITH IVP DYE 10 PLUS YEARS AGO  Desc: THROAT SWELLED/ PROBLEMS BREATHING WITH IVP DYE 10 PLUS YEARS AGO   . Tetanus Toxoid Adsorbed Anaphylaxis  . Tetanus Toxoids Anaphylaxis  . Lipitor [Atorvastatin]     Other reaction(s): Myalgias (intolerance) Myalgias Myalgias   . Prednisone     Insomnia, increased depression  . Seldane [Terfenadine]     Other reaction(s): Other (See Comments) Hair loss Hair loss  . Septra [Sulfamethoxazole-Trimethoprim]   . Tetanus Toxoid Swelling   Review of Systems  Constitutional: Negative.   HENT: Negative.   Eyes: Negative.   Respiratory:  Negative.   Cardiovascular: Negative.   Gastrointestinal: Negative.   Genitourinary: Negative.   Musculoskeletal: Negative.   Skin: Negative.   Neurological: Negative.   Endo/Heme/Allergies:       Negative except for hot flashes.  Psychiatric/Behavioral: Positive for depression. The patient is nervous/anxious.    Family history- Review and unchanged Social history- Review and unchanged Physical Exam: BP 166/64 mmHg  Pulse 56  Temp(Src) 98.4 F (36.9 C) (Temporal)  Resp 16  Ht 5' 3.5" (1.613 m)  Wt 176 lb (79.833 kg)  BMI 30.68 kg/m2 Wt Readings from Last 3 Encounters:  07/06/14 176 lb (79.833 kg)  05/06/14 176 lb (79.833 kg)  03/22/14 179 lb (81.194 kg)  Vitals Reviewed. General Appearance: Well nourished, in no apparent distress. Eyes: PERRLA, EOMs, conjunctiva no swelling or erythema Sinuses: No Frontal/maxillary tenderness ENT/Mouth: Ext aud canals clear, TMs without erythema,  bulging. No erythema, swelling, or exudate on post pharynx.  Tonsils not swollen or erythematous. Hearing normal.  Neck: Supple, thyroid normal.  Respiratory: Respiratory effort normal, CTAB.  No w/r/r or stridor.  Cardio: RRR with 3/4 systolic murmur, no rubs or gallops. Brisk peripheral pulses without edema.  Abdomen: Soft, + normal BS.  Non tender, no guarding or rebound. Lymphatics: Non tender without lymphadenopathy.  Musculoskeletal: Full ROM, 5/5 strength, normal gait.  Skin: Warm, dry without rashes, lesions, ecchymosis.  Neuro: Cranial nerves intact. No cerebellar symptoms. Sensation intact.  Psych: Awake and oriented X 3, normal affect, Insight and Judgment appropriate.    Luree Palla, Lise AuerJennifer L, PA-C 3:03 PM [facility]

## 2014-07-07 LAB — CBC WITH DIFFERENTIAL/PLATELET
BASOS ABS: 0.1 10*3/uL (ref 0.0–0.1)
Basophils Relative: 1 % (ref 0–1)
Eosinophils Absolute: 0.2 10*3/uL (ref 0.0–0.7)
Eosinophils Relative: 3 % (ref 0–5)
HCT: 39.1 % (ref 36.0–46.0)
HEMOGLOBIN: 13.1 g/dL (ref 12.0–15.0)
LYMPHS PCT: 30 % (ref 12–46)
Lymphs Abs: 1.9 10*3/uL (ref 0.7–4.0)
MCH: 30.7 pg (ref 26.0–34.0)
MCHC: 33.5 g/dL (ref 30.0–36.0)
MCV: 91.6 fL (ref 78.0–100.0)
MONO ABS: 0.4 10*3/uL (ref 0.1–1.0)
MPV: 9.2 fL — AB (ref 9.4–12.4)
Monocytes Relative: 7 % (ref 3–12)
NEUTROS ABS: 3.7 10*3/uL (ref 1.7–7.7)
Neutrophils Relative %: 59 % (ref 43–77)
PLATELETS: 228 10*3/uL (ref 150–400)
RBC: 4.27 MIL/uL (ref 3.87–5.11)
RDW: 14.4 % (ref 11.5–15.5)
WBC: 6.3 10*3/uL (ref 4.0–10.5)

## 2014-07-07 LAB — BASIC METABOLIC PANEL WITH GFR
BUN: 19 mg/dL (ref 6–23)
CO2: 26 mEq/L (ref 19–32)
Calcium: 9.8 mg/dL (ref 8.4–10.5)
Chloride: 106 mEq/L (ref 96–112)
Creat: 0.75 mg/dL (ref 0.50–1.10)
GFR, EST NON AFRICAN AMERICAN: 78 mL/min
GFR, Est African American: >89 mL/min
Glucose, Bld: 89 mg/dL (ref 70–99)
POTASSIUM: 4.2 meq/L (ref 3.5–5.3)
Sodium: 138 mEq/L (ref 135–145)

## 2014-07-07 LAB — LIPID PANEL
Cholesterol: 134 mg/dL (ref 0–200)
HDL: 47 mg/dL (ref >39)
LDL CALC: 61 mg/dL (ref 0–99)
Total CHOL/HDL Ratio: 2.9 Ratio
Triglycerides: 131 mg/dL (ref <150)
VLDL: 26 mg/dL (ref 0–40)

## 2014-07-07 LAB — HEMOGLOBIN A1C
Hgb A1c MFr Bld: 5.7 % — ABNORMAL HIGH (ref <5.7)
Mean Plasma Glucose: 117 mg/dL — ABNORMAL HIGH (ref <117)

## 2014-07-07 LAB — HEPATIC FUNCTION PANEL
ALK PHOS: 90 U/L (ref 39–117)
ALT: 10 U/L (ref 0–35)
AST: 12 U/L (ref 0–37)
Albumin: 4.3 g/dL (ref 3.5–5.2)
BILIRUBIN INDIRECT: 0.4 mg/dL (ref 0.2–1.2)
Bilirubin, Direct: 0.1 mg/dL (ref 0.0–0.3)
Total Bilirubin: 0.5 mg/dL (ref 0.2–1.2)
Total Protein: 6.7 g/dL (ref 6.0–8.3)

## 2014-07-07 LAB — TSH: TSH: 1.665 u[IU]/mL (ref 0.350–4.500)

## 2014-07-07 LAB — INSULIN, FASTING: Insulin fasting, serum: 7.9 u[IU]/mL (ref 2.0–19.6)

## 2014-07-13 DIAGNOSIS — F330 Major depressive disorder, recurrent, mild: Secondary | ICD-10-CM | POA: Diagnosis not present

## 2014-07-15 DIAGNOSIS — M1711 Unilateral primary osteoarthritis, right knee: Secondary | ICD-10-CM | POA: Diagnosis not present

## 2014-07-27 DIAGNOSIS — M25561 Pain in right knee: Secondary | ICD-10-CM | POA: Diagnosis not present

## 2014-07-27 DIAGNOSIS — M1711 Unilateral primary osteoarthritis, right knee: Secondary | ICD-10-CM | POA: Diagnosis not present

## 2014-08-02 DIAGNOSIS — M722 Plantar fascial fibromatosis: Secondary | ICD-10-CM | POA: Diagnosis not present

## 2014-08-04 DIAGNOSIS — M1711 Unilateral primary osteoarthritis, right knee: Secondary | ICD-10-CM | POA: Diagnosis not present

## 2014-08-04 DIAGNOSIS — M25561 Pain in right knee: Secondary | ICD-10-CM | POA: Diagnosis not present

## 2014-08-15 ENCOUNTER — Other Ambulatory Visit: Payer: Self-pay | Admitting: Internal Medicine

## 2014-08-31 ENCOUNTER — Other Ambulatory Visit: Payer: Self-pay | Admitting: Emergency Medicine

## 2014-09-01 ENCOUNTER — Other Ambulatory Visit: Payer: Self-pay | Admitting: Emergency Medicine

## 2014-09-13 DIAGNOSIS — Z681 Body mass index (BMI) 19 or less, adult: Secondary | ICD-10-CM | POA: Diagnosis not present

## 2014-09-13 DIAGNOSIS — F330 Major depressive disorder, recurrent, mild: Secondary | ICD-10-CM | POA: Diagnosis not present

## 2014-09-17 DIAGNOSIS — G894 Chronic pain syndrome: Secondary | ICD-10-CM | POA: Diagnosis not present

## 2014-09-17 DIAGNOSIS — M2660 Temporomandibular joint disorder, unspecified: Secondary | ICD-10-CM | POA: Diagnosis not present

## 2014-10-05 ENCOUNTER — Other Ambulatory Visit: Payer: Self-pay | Admitting: *Deleted

## 2014-10-05 ENCOUNTER — Encounter: Payer: Self-pay | Admitting: Internal Medicine

## 2014-10-05 ENCOUNTER — Ambulatory Visit (INDEPENDENT_AMBULATORY_CARE_PROVIDER_SITE_OTHER): Payer: Medicare Other | Admitting: Internal Medicine

## 2014-10-05 VITALS — BP 160/64 | HR 60 | Temp 97.3°F | Resp 16 | Ht 63.5 in | Wt 185.8 lb

## 2014-10-05 DIAGNOSIS — E559 Vitamin D deficiency, unspecified: Secondary | ICD-10-CM

## 2014-10-05 DIAGNOSIS — Z79899 Other long term (current) drug therapy: Secondary | ICD-10-CM | POA: Diagnosis not present

## 2014-10-05 DIAGNOSIS — E782 Mixed hyperlipidemia: Secondary | ICD-10-CM

## 2014-10-05 DIAGNOSIS — E1129 Type 2 diabetes mellitus with other diabetic kidney complication: Secondary | ICD-10-CM

## 2014-10-05 DIAGNOSIS — I10 Essential (primary) hypertension: Secondary | ICD-10-CM

## 2014-10-05 DIAGNOSIS — E7800 Pure hypercholesterolemia, unspecified: Secondary | ICD-10-CM

## 2014-10-05 LAB — CBC WITH DIFFERENTIAL/PLATELET
BASOS ABS: 0.1 10*3/uL (ref 0.0–0.1)
BASOS PCT: 1 % (ref 0–1)
Eosinophils Absolute: 0.2 10*3/uL (ref 0.0–0.7)
Eosinophils Relative: 3 % (ref 0–5)
HCT: 40 % (ref 36.0–46.0)
Hemoglobin: 13.3 g/dL (ref 12.0–15.0)
LYMPHS PCT: 35 % (ref 12–46)
Lymphs Abs: 2.4 10*3/uL (ref 0.7–4.0)
MCH: 31.2 pg (ref 26.0–34.0)
MCHC: 33.3 g/dL (ref 30.0–36.0)
MCV: 93.9 fL (ref 78.0–100.0)
MONO ABS: 0.3 10*3/uL (ref 0.1–1.0)
MPV: 9.4 fL (ref 8.6–12.4)
Monocytes Relative: 5 % (ref 3–12)
Neutro Abs: 3.9 10*3/uL (ref 1.7–7.7)
Neutrophils Relative %: 56 % (ref 43–77)
PLATELETS: 254 10*3/uL (ref 150–400)
RBC: 4.26 MIL/uL (ref 3.87–5.11)
RDW: 14.4 % (ref 11.5–15.5)
WBC: 6.9 10*3/uL (ref 4.0–10.5)

## 2014-10-05 MED ORDER — LISINOPRIL 40 MG PO TABS: ORAL_TABLET | ORAL | Status: DC

## 2014-10-05 MED ORDER — HYDROCHLOROTHIAZIDE 25 MG PO TABS: ORAL_TABLET | ORAL | Status: DC

## 2014-10-05 MED ORDER — ROSUVASTATIN CALCIUM 20 MG PO TABS: 20.0000 mg | ORAL_TABLET | Freq: Every day | ORAL | Status: AC

## 2014-10-05 NOTE — Patient Instructions (Signed)

## 2014-10-05 NOTE — Progress Notes (Signed)
Patient ID: [name], female   DOB: 08/21/1935, 79 y.o.   MRN: [id number]   [facility] very [facility] 79 [facility] for 3 month follow up with Hypertension, Hyperlipidemia, Pre-Diabetes and Vitamin D Deficiency.    Patient is treated for HTN & BP has been controlled at home. Today's BP wa elevated at 160/64 and rechecked at 180/100. Patient has had no complaints of any cardiac type chest pain, palpitations, dyspnea/orthopnea/PND, dizziness, claudication, or dependent edema.   Hyperlipidemia is controlled with diet & meds. Patient denies myalgias or other med SE's. Last Lipids were at goal - Total Chol 134; HDL 47; LDL  61; Trig 131 on 07/06/2014.   Also, the patient has history of T2_NIDDM w/ CKD which she has attempted to control with diet.and has had no symptoms of reactive hypoglycemia, diabetic polys, paresthesias or visual blurring.  Last A1c was 5.7% on 07/06/2014.   Further, the patient also has history of Vitamin D Deficiency and supplements vitamin D without any suspected side-effects. Last vitamin D was 55 in Feb 2015.  Medication Sig  . acyclovir (ZOVIRAX) 800 MG tablet Take 800 mg by mouth as needed. outbreaks  . albuterol  HFA  inhaler Inhale 2 puffs into the lungs every 6 (six) hours as needed for wheezing.  Marland Kitchen. ALPRAZolam (XANAX) 0.25 MG tablet Take 0.25 mg by mouth daily as needed. Anxiety As needed  . amLODipine (NORVASC) 5 MG tablet Take 1 tablet (5 mg total) by mouth daily.  . Cholecalciferol (VITAMIN D PO) Take by mouth daily.  Marland Kitchen. escitalopram (LEXAPRO) 10 MG tablet Take 10 mg by mouth daily.  . NORCO 10-325 MG per tablet Take 1 tablet by mouth every 6 (six) hours as needed.  Marland Kitchen. levothyroxine  175 MCG tablet TAKE ONE TABLET BY MOUTH ONCE DAILY  . lisinopril  20 MG tablet Take 1 tablet (20 mg total) by mouth daily.  Marland Kitchen. MAGNESIUM PO Take 1 tablet by mouth daily.  . meclizine (ANTIVERT) 25 MG tablet Take 25 mg by mouth 3 (three) times daily as needed for dizziness. For dizziness   . modafinil (PROVIGIL) 200 MG tablet TAKE ONE TABLET BY MOUTH TWICE DAILY  . omeprazole (PRILOSEC) 40 MG capsule TAKE ONE CAPSULE BY MOUTH ONCE DAILY FOR  HEARTBURN  . rosuvastatin (CRESTOR) 20 MG tablet Take 1 tablet (20 mg total) by mouth daily.  Marland Kitchen. buPROPion  SR) 150 MG 12 hr tablet Take 150 mg by mouth daily. 1 tab daily  . DULoxetine (CYMBALTA) 60 MG capsule Take 40 mg by mouth daily.   . prochlorperazine (COMPAZINE) 5 MG tablet as needed for refractory nausea / vomiting. Take 1 tablet 3 x daily before meals for nausea   Allergies  Allergen Reactions  . Iodinated Diagnostic Agents Anaphylaxis  . Iohexol Anaphylaxis     Desc: THROAT SWELLED/ PROBLEMS BREATHING WITH IVP DYE 10 PLUS YEARS AGO  Desc: THROAT SWELLED/ PROBLEMS BREATHING WITH IVP DYE 10 PLUS YEARS AGO   . Tetanus Toxoid Adsorbed Anaphylaxis  . Tetanus Toxoids Anaphylaxis  . Lipitor [Atorvastatin]     Other reaction(s): Myalgias (intolerance) Myalgias Myalgias   . Prednisone     Insomnia, increased depression  . Seldane [Terfenadine]     Other reaction(s): Other (See Comments) Hair loss Hair loss  . Septra [Sulfamethoxazole-Trimethoprim]   . Tetanus Toxoid Swelling    PMHx:   Past Medical History  Diagnosis Date  . Dyslipidemia   . Sleep apnea     5 yrs --cpap  does not use   . Heart murmur   . H/O hiatal hernia   . Arthritis   . Hyperlipidemia   . Hypertension   . GERD (gastroesophageal reflux disease)   . Depression   . Diabetes mellitus     diet controlled  . Hypothyroidism    Immunization History  Administered Date(s) Administered  . Pneumococcal Conjugate-13 11/04/2013  . Pneumococcal Polysaccharide-23 08/06/1998   Past Surgical History  Procedure Laterality Date  . Breast tumor      secondary to fibrocystic disese  . Cholecystctomy    . Left hip replacement    . Abdominal hysterectomy    . Cardiac catheterization      normal dr. Fredrich Birks  . Joint replacement      left hip  .  Cholecystectomy    . Total shoulder arthroplasty  11/15/2011    Procedure: TOTAL SHOULDER ARTHROPLASTY;  Surgeon: Senaida Lange, MD;  Location: [facility] OR;  Service: Orthopedics;  Laterality: Left;  left total shoulder arthroplasty   FHx:    Reviewed / unchanged  SHx:    Reviewed / unchanged  Systems Review:  Constitutional: Denies fever, chills, wt changes, headaches, insomnia, fatigue, night sweats, change in appetite. Eyes: Denies redness, blurred vision, diplopia, discharge, itchy, watery eyes.  ENT: Denies discharge, congestion, post nasal drip, epistaxis, sore throat, earache, hearing loss, dental pain, tinnitus, vertigo, sinus pain, snoring.  CV: Denies chest pain, palpitations, irregular heartbeat, syncope, dyspnea, diaphoresis, orthopnea, PND, claudication or edema. Respiratory: denies cough, dyspnea, DOE, pleurisy, hoarseness, laryngitis, wheezing.  Gastrointestinal: Denies dysphagia, odynophagia, heartburn, reflux, water brash, abdominal pain or cramps, nausea, vomiting, bloating, diarrhea, constipation, hematemesis, melena, hematochezia  or hemorrhoids. Genitourinary: Denies dysuria, frequency, urgency, nocturia, hesitancy, discharge, hematuria or flank pain. Musculoskeletal: Denies arthralgias, myalgias, stiffness, jt. swelling, pain, limping or strain/sprain.  Skin: Denies pruritus, rash, hives, warts, acne, eczema or change in skin lesion(s). Neuro: No weakness, tremor, incoordination, spasms, paresthesia or pain. Psychiatric: Denies confusion, memory loss or sensory loss. Endo: Denies change in weight, skin or hair change.  Heme/Lymph: No excessive bleeding, bruising or enlarged lymph nodes.  Physical Exam  BP 160/64 mmHg  Pulse 60  Temp(Src) 97.3 F (36.3 C)  Resp 16  Ht 5' 3.5" (1.613 m)  Wt 185 lb 12.8 oz (84.278 kg)    BMI 32.39 kg/m2  Appears well nourished and in no distress. Eyes: PERRLA, EOMs, conjunctiva no swelling or erythema. Sinuses: No frontal/maxillary  tenderness ENT/Mouth: EAC's clear, TM's nl w/o erythema, bulging. Nares clear w/o erythema, swelling, exudates. Oropharynx clear without erythema or exudates. Oral hygiene is good. Tongue normal, non obstructing. Hearing intact.  Neck: Supple. Thyroid nl. Car 2+/2+ without bruits, nodes or JVD. Chest: Respirations nl with BS clear & equal w/o rales, rhonchi, wheezing or stridor.  Cor: Heart sounds normal w/ regular rate and rhythm without sig. murmurs, gallops, clicks, or rubs. Peripheral pulses normal and equal  without edema.  Abdomen: Soft & bowel sounds normal. Non-tender w/o guarding, rebound, hernias, masses, or organomegaly.  Lymphatics: Unremarkable.  Musculoskeletal: Full ROM all peripheral extremities, joint stability, 5/5 strength, and normal gait.  Skin: Warm, dry without exposed rashes, lesions or ecchymosis apparent.  Neuro: Cranial nerves intact, reflexes equal bilaterally. Sensory-motor testing grossly intact. Tendon reflexes grossly intact.  Pysch: Alert & oriented x 3.  Insight and judgement nl & appropriate. No ideations.  Assessment and Plan:  1. Essential hypertension, not controlled  - Increase Rx Lisinopril 40 mg qd and add Rx HCTZ  25 mg qd  - monitor BP's & call if greater 140/90  - TSH  2. Mixed hyperlipidemia  - Lipid panel  3. Type 2 diabetes mellitus with other diabetic kidney complication  - Hemoglobin A1c - Insulin, fasting   4. Vitamin D deficiency  - Vit D  25 hydroxy (rtn osteoporosis monitoring)  5. Encounter for long-term (current) use of medications  - CBC with Differential/Platelet - BASIC METABOLIC PANEL WITH GFR - Hepatic function panel - Magnesium   Recommended regular exercise, BP monitoring, weight control, and discussed med and SE's. Recommended labs to assess and monitor clinical status. Further disposition pending results of labs.

## 2014-10-06 ENCOUNTER — Other Ambulatory Visit: Payer: Self-pay | Admitting: *Deleted

## 2014-10-06 LAB — LIPID PANEL
CHOLESTEROL: 308 mg/dL — AB (ref 0–200)
HDL: 48 mg/dL (ref >=46)
LDL Cholesterol: 203 mg/dL — ABNORMAL HIGH (ref 0–99)
Total CHOL/HDL Ratio: 6.4 Ratio
Triglycerides: 284 mg/dL — ABNORMAL HIGH (ref <150)
VLDL: 57 mg/dL — ABNORMAL HIGH (ref 0–40)

## 2014-10-06 LAB — HEPATIC FUNCTION PANEL
ALBUMIN: 4.2 g/dL (ref 3.5–5.2)
ALT: 12 U/L (ref 0–35)
AST: 12 U/L (ref 0–37)
Alkaline Phosphatase: 84 U/L (ref 39–117)
Bilirubin, Direct: 0.1 mg/dL (ref 0.0–0.3)
Indirect Bilirubin: 0.2 mg/dL (ref 0.2–1.2)
Total Bilirubin: 0.3 mg/dL (ref 0.2–1.2)
Total Protein: 6.5 g/dL (ref 6.0–8.3)

## 2014-10-06 LAB — HEMOGLOBIN A1C
Hgb A1c MFr Bld: 6 % — ABNORMAL HIGH (ref <5.7)
Mean Plasma Glucose: 126 mg/dL — ABNORMAL HIGH (ref <117)

## 2014-10-06 LAB — INSULIN, FASTING: Insulin fasting, serum: 41.2 u[IU]/mL — ABNORMAL HIGH (ref 2.0–19.6)

## 2014-10-06 LAB — BASIC METABOLIC PANEL WITH GFR
BUN: 19 mg/dL (ref 6–23)
CALCIUM: 9.6 mg/dL (ref 8.4–10.5)
CHLORIDE: 105 meq/L (ref 96–112)
CO2: 29 meq/L (ref 19–32)
Creat: 0.72 mg/dL (ref 0.50–1.10)
GFR, Est African American: >89 mL/min
GFR, Est Non African American: 82 mL/min
Glucose, Bld: 101 mg/dL — ABNORMAL HIGH (ref 70–99)
POTASSIUM: 4.5 meq/L (ref 3.5–5.3)
SODIUM: 142 meq/L (ref 135–145)

## 2014-10-06 LAB — VITAMIN D 25 HYDROXY (VIT D DEFICIENCY, FRACTURES): Vit D, 25-Hydroxy: 31 ng/mL (ref 30–100)

## 2014-10-06 LAB — TSH: TSH: 14.676 u[IU]/mL — ABNORMAL HIGH (ref 0.350–4.500)

## 2014-10-06 LAB — MAGNESIUM: Magnesium: 1.7 mg/dL (ref 1.5–2.5)

## 2014-10-06 MED ORDER — LEVOTHYROXINE SODIUM 175 MCG PO TABS: 175.0000 ug | ORAL_TABLET | Freq: Every day | ORAL | Status: DC

## 2014-10-06 MED ORDER — OMEPRAZOLE 40 MG PO CPDR: DELAYED_RELEASE_CAPSULE | ORAL | Status: DC

## 2014-10-07 DIAGNOSIS — M1711 Unilateral primary osteoarthritis, right knee: Secondary | ICD-10-CM | POA: Diagnosis not present

## 2014-10-11 ENCOUNTER — Other Ambulatory Visit: Payer: Self-pay | Admitting: *Deleted

## 2014-10-11 MED ORDER — OMEPRAZOLE 40 MG PO CPDR: DELAYED_RELEASE_CAPSULE | ORAL | Status: DC

## 2014-10-11 MED ORDER — LEVOTHYROXINE SODIUM 175 MCG PO TABS: 175.0000 ug | ORAL_TABLET | Freq: Every day | ORAL | Status: AC

## 2014-10-12 ENCOUNTER — Other Ambulatory Visit (INDEPENDENT_AMBULATORY_CARE_PROVIDER_SITE_OTHER): Payer: Self-pay | Admitting: Surgery

## 2014-10-12 DIAGNOSIS — R2232 Localized swelling, mass and lump, left upper limb: Secondary | ICD-10-CM | POA: Diagnosis not present

## 2014-10-12 DIAGNOSIS — R2230 Localized swelling, mass and lump, unspecified upper limb: Secondary | ICD-10-CM

## 2014-10-25 ENCOUNTER — Inpatient Hospital Stay: Admission: RE | Admit: 2014-10-25 | Payer: Federal, State, Local not specified - PPO | Source: Ambulatory Visit

## 2014-11-01 ENCOUNTER — Encounter: Payer: Self-pay | Admitting: Internal Medicine

## 2014-11-01 ENCOUNTER — Ambulatory Visit (INDEPENDENT_AMBULATORY_CARE_PROVIDER_SITE_OTHER): Payer: Medicare Other | Admitting: Internal Medicine

## 2014-11-01 VITALS — BP 138/70 | HR 56 | Temp 97.9°F | Resp 16 | Ht 63.5 in | Wt 180.0 lb

## 2014-11-01 DIAGNOSIS — R42 Dizziness and giddiness: Secondary | ICD-10-CM | POA: Diagnosis not present

## 2014-11-01 LAB — CBC WITH DIFFERENTIAL/PLATELET
BASOS ABS: 0.1 10*3/uL (ref 0.0–0.1)
BASOS PCT: 1 % (ref 0–1)
EOS PCT: 2 % (ref 0–5)
Eosinophils Absolute: 0.1 10*3/uL (ref 0.0–0.7)
HCT: 37.8 % (ref 36.0–46.0)
Hemoglobin: 12.9 g/dL (ref 12.0–15.0)
Lymphocytes Relative: 31 % (ref 12–46)
Lymphs Abs: 2 10*3/uL (ref 0.7–4.0)
MCH: 31.5 pg (ref 26.0–34.0)
MCHC: 34.1 g/dL (ref 30.0–36.0)
MCV: 92.4 fL (ref 78.0–100.0)
MPV: 8.9 fL (ref 8.6–12.4)
Monocytes Absolute: 0.5 10*3/uL (ref 0.1–1.0)
Monocytes Relative: 7 % (ref 3–12)
Neutro Abs: 3.9 10*3/uL (ref 1.7–7.7)
Neutrophils Relative %: 59 % (ref 43–77)
PLATELETS: 203 10*3/uL (ref 150–400)
RBC: 4.09 MIL/uL (ref 3.87–5.11)
RDW: 14.7 % (ref 11.5–15.5)
WBC: 6.6 10*3/uL (ref 4.0–10.5)

## 2014-11-01 LAB — BASIC METABOLIC PANEL
BUN: 22 mg/dL (ref 6–23)
CHLORIDE: 104 meq/L (ref 96–112)
CO2: 30 meq/L (ref 19–32)
CREATININE: 0.77 mg/dL (ref 0.50–1.10)
Calcium: 9.6 mg/dL (ref 8.4–10.5)
Glucose, Bld: 93 mg/dL (ref 70–99)
POTASSIUM: 3.9 meq/L (ref 3.5–5.3)
Sodium: 142 mEq/L (ref 135–145)

## 2014-11-01 LAB — HEPATIC FUNCTION PANEL
ALT: 13 U/L (ref 0–35)
AST: 12 U/L (ref 0–37)
Albumin: 4.2 g/dL (ref 3.5–5.2)
Alkaline Phosphatase: 77 U/L (ref 39–117)
BILIRUBIN DIRECT: 0.1 mg/dL (ref 0.0–0.3)
Indirect Bilirubin: 0.2 mg/dL (ref 0.2–1.2)
TOTAL PROTEIN: 6.5 g/dL (ref 6.0–8.3)
Total Bilirubin: 0.3 mg/dL (ref 0.2–1.2)

## 2014-11-01 MED ORDER — MECLIZINE HCL 25 MG PO TABS: 25.0000 mg | ORAL_TABLET | Freq: Three times a day (TID) | ORAL | Status: DC | PRN

## 2014-11-01 MED ORDER — PREDNISONE 20 MG PO TABS
ORAL_TABLET | ORAL | Status: DC
Start: 2014-11-01 — End: 2014-12-02

## 2014-11-01 NOTE — Progress Notes (Signed)
Subjective:    Patient ID: [name], female    DOB: [birth year], 79 y.o.   MRN: [id number]  HPI  Patient reports to the office for evaluation of dizziness.  She reports that it has been going on for the past week.  She reports that when she is up and moving she has very severe dizziness.  She reports that she feels off kilter and that she cannot get her legs to go where they need to go.  She reports falling last night when she tried to get up to go to the bathroom.  She reports no trigger for the dizziness.  She has had no changes in medicines, no new OTC meds.  She reports that 4 weeks ago she had an increase in her blood pressure medicines.  She reports that prior to last week she has not had any problems.  The dizziness lasts for as long as she is standing or walking.  She reports no dizziness with lying down either.  She reports that she has not been checking her blood pressure at home.  She reports that she did have some dental work done last week.  She reports no changes in eating or drinking habits.  She reports that the dizziness was improving and last night it got really bad.     Review of Systems  HENT: Positive for tinnitus. Negative for congestion, ear discharge, ear pain, postnasal drip, rhinorrhea, sinus pressure, sore throat, trouble swallowing and voice change.   Eyes: Negative for visual disturbance.  Respiratory: Negative for cough, chest tightness and shortness of breath.   Cardiovascular: Negative for chest pain, palpitations and leg swelling.  Neurological: Positive for dizziness and light-headedness. Negative for headaches.  Psychiatric/Behavioral: Negative for confusion.       Objective:   Physical Exam  Constitutional: She is oriented to person, place, and time. She appears well-developed and well-nourished. No distress.  HENT:  Head: Normocephalic and atraumatic.  Mouth/Throat: Oropharynx is clear and moist. No oropharyngeal exudate.  Eyes: Conjunctivae and  EOM are normal. Pupils are equal, round, and reactive to light. No scleral icterus.  Neck: Normal range of motion. Neck supple. No JVD present. No thyromegaly present.  Cardiovascular: Normal rate, regular rhythm and intact distal pulses.  Exam reveals no gallop and no friction rub.   Murmur heard. Pulmonary/Chest: Effort normal and breath sounds normal. No respiratory distress. She has no wheezes. She has no rales. She exhibits no tenderness.  Musculoskeletal: Normal range of motion.  Lymphadenopathy:    She has no cervical adenopathy.  Neurological: She is alert and oriented to person, place, and time. She has normal strength. No cranial nerve deficit or sensory deficit. Gait abnormal. GCS eye subscore is 4. GCS verbal subscore is 5. GCS motor subscore is 6.  Mildly ataxic gait.  Patient walks with a cane in office today but does not usually use one.   Skin: Skin is warm and dry. She is not diaphoretic.  Psychiatric: She has a normal mood and affect. Her behavior is normal. Judgment and thought content normal.  Nursing note and vitals reviewed.   Filed Vitals:   11/01/14 1425  BP: 138/70  Pulse: 56  Temp: 97.9 F (36.6 C)  Resp: 16         Assessment & Plan:    1. Dizziness and giddiness Patient presents to the office for dizziness which is non-specific.  Physical exam reveals a mildly ataxic gait on exam with no other focal abnormalities.  No evidence for nystagmus or neuro deficits.  Central cause possible but unlikely.  Concerned patient is falling.  No evidence for orthostatic vital signs which I performed in the office today.  EKG with mild non-specific changes but no other abnormalities noted.  Will try meclizine and aggressive oral hydration.  Will obtain labs and see back in the office in 3 days.  Patients EKG reviewed with Dr. Oneta Rack and plan discussed with Dr. Oneta Rack.      - EKG 12-Lead - CBC with Differential/Platelet - Basic metabolic panel - Hepatic function  panel -meclizine

## 2014-11-01 NOTE — Patient Instructions (Signed)
Dizziness   Dizziness means you feel unsteady or lightheaded. You might feel like you are going to pass out (faint).  HOME CARE   · Drink enough fluids to keep your pee (urine) clear or pale yellow.  · Take your medicines exactly as told by your doctor. If you take blood pressure medicine, always stand up slowly from the lying or sitting position. Hold on to something to steady yourself.  · If you need to stand in one place for a long time, move your legs often. Tighten and relax your leg muscles.  · Have someone stay with you until you feel okay.  · Do not drive or use heavy machinery if you feel dizzy.  · Do not drink alcohol.  GET HELP RIGHT AWAY IF:   · You feel dizzy or lightheaded and it gets worse.  · You feel sick to your stomach (nauseous), or you throw up (vomit).  · You have trouble talking or walking.  · You feel weak or have trouble using your arms, hands, or legs.  · You cannot think clearly or have trouble forming sentences.  · You have chest pain, belly (abdominal) pain, sweating, or you are short of breath.  · Your vision changes.  · You are bleeding.  · You have problems from your medicine that seem to be getting worse.  MAKE SURE YOU:   · Understand these instructions.  · Will watch your condition.  · Will get help right away if you are not doing well or get worse.  Document Released: 07/12/2011 Document Revised: 10/15/2011 Document Reviewed: 07/12/2011  Exit[employer]® Patient Information ©2015 [employer]. This information is not intended to replace advice given to you by your health care provider. Make sure you discuss any questions you have with your health care provider.

## 2014-11-04 ENCOUNTER — Encounter: Payer: Self-pay | Admitting: Internal Medicine

## 2014-11-04 ENCOUNTER — Ambulatory Visit (INDEPENDENT_AMBULATORY_CARE_PROVIDER_SITE_OTHER): Payer: Medicare Other | Admitting: Internal Medicine

## 2014-11-04 VITALS — BP 116/64 | HR 58 | Temp 98.2°F | Resp 16 | Ht 63.5 in | Wt 179.0 lb

## 2014-11-04 DIAGNOSIS — R42 Dizziness and giddiness: Secondary | ICD-10-CM | POA: Diagnosis not present

## 2014-11-04 NOTE — Patient Instructions (Signed)

## 2014-11-04 NOTE — Progress Notes (Signed)
   Subjective:    Patient ID: [name], female    DOB: 1936/07/29, 79 y.o.   MRN: [id number]  Dizziness This is a new problem. The current episode started in the past 7 days. Episode frequency: only with movement and walking. The problem has been unchanged. Pertinent negatives include no chest pain, chills, congestion, headaches or numbness. The symptoms are aggravated by standing and walking. The treatment provided no relief.   Patient reports that she has had minimal change with medication.  She reports that she is taking the meclizine and can't really seem to tell a difference.  She reports that she has had no falls.  No syncope.  She reports that she is also taking the prednisone.     Review of Systems  Constitutional: Negative for chills.  HENT: Negative for congestion, ear discharge, ear pain, facial swelling, rhinorrhea, sinus pressure, sneezing and trouble swallowing.   Eyes: Negative for visual disturbance.  Cardiovascular: Negative for chest pain.  Neurological: Positive for dizziness. Negative for speech difficulty, light-headedness, numbness and headaches.  Psychiatric/Behavioral: Negative for confusion.       Objective:   Physical Exam  Constitutional: She is oriented to person, place, and time. She appears well-developed and well-nourished. No distress.  HENT:  Head: Normocephalic and atraumatic.  Mouth/Throat: Oropharynx is clear and moist. No oropharyngeal exudate.  Eyes: Conjunctivae and EOM are normal. Pupils are equal, round, and reactive to light. No scleral icterus.  Neck: Normal range of motion. Neck supple. No JVD present. No thyromegaly present.  Cardiovascular: Normal rate, regular rhythm, normal heart sounds and intact distal pulses.  Exam reveals no gallop and no friction rub.   No murmur heard. Pulmonary/Chest: Effort normal and breath sounds normal. No respiratory distress. She has no wheezes. She has no rales. She exhibits no tenderness.   Abdominal: Soft. Bowel sounds are normal. She exhibits no distension and no mass. There is no tenderness. There is no rebound and no guarding.  Musculoskeletal: Normal range of motion.  Lymphadenopathy:    She has no cervical adenopathy.  Neurological: She is alert and oriented to person, place, and time. She has normal strength. No cranial nerve deficit or sensory deficit. Coordination normal.  Skin: Skin is warm and dry. She is not diaphoretic.  Psychiatric: She has a normal mood and affect. Her behavior is normal. Judgment and thought content normal.  Nursing note and vitals reviewed.   Filed Vitals:   11/04/14 1435  BP: 116/64  Pulse: 58  Temp: 98.2 F (36.8 C)  Resp: 16       Assessment & Plan:     1. Dizziness  -Patient's exam has improved her ataxia does appear to be better.   -Cont meclizine and prednisone -Patient does have several different risk factors for CVA including HTN, HL, and DM.  Will order MRI to rule out brain stem involvement vs. Possible lesion.  If symptoms completely resolve than we will cancel.  Doubt CVA at this time, but possibility.  Patient also could have dizziness as a result of depression.

## 2014-11-08 ENCOUNTER — Encounter: Payer: Self-pay | Admitting: Internal Medicine

## 2014-11-08 ENCOUNTER — Ambulatory Visit (INDEPENDENT_AMBULATORY_CARE_PROVIDER_SITE_OTHER): Payer: Medicare Other | Admitting: Internal Medicine

## 2014-11-08 ENCOUNTER — Telehealth: Payer: Self-pay | Admitting: *Deleted

## 2014-11-08 VITALS — BP 115/50 | HR 77

## 2014-11-08 DIAGNOSIS — I10 Essential (primary) hypertension: Secondary | ICD-10-CM

## 2014-11-08 DIAGNOSIS — I779 Disorder of arteries and arterioles, unspecified: Secondary | ICD-10-CM

## 2014-11-08 DIAGNOSIS — I739 Peripheral vascular disease, unspecified: Secondary | ICD-10-CM

## 2014-11-08 DIAGNOSIS — E039 Hypothyroidism, unspecified: Secondary | ICD-10-CM

## 2014-11-08 MED ORDER — DOXYCYCLINE HYCLATE 100 MG PO TABS: 100.0000 mg | ORAL_TABLET | Freq: Two times a day (BID) | ORAL | Status: DC

## 2014-11-08 NOTE — Progress Notes (Signed)
Cardiology Office Note   Date:  11/08/2014   ID:  [name], DOB 12/21/1935, MRN [id number]  PCP:  Nadean Corwin, MD  Cardiologist:   Dietrich Pates, MD   Chief Complaint  Patient presents with  . Coronary Artery Disease  . Dizziness      History of Present Illness: [name] is a 79 y.o. female who presents for f/u of CAD   She has a history of mild to moderate CAD (Cath 2009 after abnormmal perfusion test. 50% mid/distal LAD; 50% L Cx), 60% R renal artery stenosis, OSA (not using), DM, HTN, Dyslpidemia. She is also seen by Dr. Oneta Rack.  Patient was last in clinic in Aug At that time I switched her to amlodipine 5 and lisinopril 20 (cost plus dizzy) Patient feeling good And, meds cheap  Patient was last seen in the Fall.   Since seen she was seen by Dr Oneta Rack  BP was high on last visit and he increased lisinopril and added HCTZ Since then she has felt dizzy  Larey Seat one night due to dizziness  Had gotten up to go to bathroom Denies sy  Past Medical History  Diagnosis Date  . Dyslipidemia   . Sleep apnea     5 yrs --cpap  does not use   . Heart murmur   . H/O hiatal hernia   . Arthritis   . Hyperlipidemia   . Hypertension   . GERD (gastroesophageal reflux disease)   . Depression   . Diabetes mellitus     diet controlled  . Hypothyroidism     Past Surgical History  Procedure Laterality Date  . Breast tumor      secondary to fibrocystic disese  . Cholecystctomy    . Left hip replacement    . Abdominal hysterectomy    . Cardiac catheterization      normal dr. Fredrich Birks  . Joint replacement      left hip  . Cholecystectomy    . Total shoulder arthroplasty  11/15/2011    Procedure: TOTAL SHOULDER ARTHROPLASTY;  Surgeon: Senaida Lange, MD;  Location: [facility] OR;  Service: Orthopedics;  Laterality: Left;  left total shoulder arthroplasty     Current Outpatient Prescriptions  Medication Sig Dispense Refill  . albuterol (PROVENTIL HFA;VENTOLIN  HFA) 108 (90 BASE) MCG/ACT inhaler Inhale 2 puffs into the lungs every 6 (six) hours as needed for wheezing.    Marland Kitchen ALPRAZolam (XANAX) 0.25 MG tablet Take 0.25 mg by mouth daily as needed. Anxiety As needed    . amLODipine (NORVASC) 5 MG tablet Take 1 tablet (5 mg total) by mouth daily. 180 tablet 3  . buPROPion (WELLBUTRIN) 100 MG tablet Take 100 mg by mouth 2 (two) times daily.    . Cholecalciferol (VITAMIN D PO) Take 1 capsule by mouth daily.     Marland Kitchen doxycycline (VIBRA-TABS) 100 MG tablet Take 1 tablet (100 mg total) by mouth 2 (two) times daily. 6 tablet 0  . escitalopram (LEXAPRO) 10 MG tablet Take 10 mg by mouth daily.    . hydrochlorothiazide (HYDRODIURIL) 25 MG tablet Take 1 tablet daily for BP & Fluid 90 tablet 1  . HYDROcodone-acetaminophen (NORCO) 10-325 MG per tablet Take 1 tablet by mouth every 6 (six) hours as needed.    . lamoTRIgine (LAMICTAL) 25 MG tablet Take 25 mg by mouth. Takes 2 tabs at bedtime.    Marland Kitchen levothyroxine (SYNTHROID, LEVOTHROID) 175 MCG tablet Take 1 tablet (175 mcg total) by mouth  daily. 90 tablet 1  . lisinopril (PRINIVIL,ZESTRIL) 40 MG tablet Take 1 tablet daily for BP & Kidneys 90 tablet 1  . MAGNESIUM PO Take 1 tablet by mouth daily.    . meclizine (ANTIVERT) 25 MG tablet Take 1 tablet (25 mg total) by mouth 3 (three) times daily as needed for dizziness. For dizziness 30 tablet 0  . modafinil (PROVIGIL) 200 MG tablet TAKE ONE TABLET BY MOUTH TWICE DAILY 180 tablet 0  . omeprazole (PRILOSEC) 40 MG capsule TAKE ONE CAPSULE BY MOUTH ONCE DAILY FOR  HEARTBURN 30 capsule 3  . predniSONE (DELTASONE) 20 MG tablet 3 tabs po day one, then 2 tabs daily x 4 days 11 tablet 0  . rosuvastatin (CRESTOR) 20 MG tablet Take 1 tablet (20 mg total) by mouth daily. 90 tablet 3   No current facility-administered medications for this visit.    Allergies:   Iodinated diagnostic agents; Iohexol; Lipitor; Septra; Tetanus toxoid adsorbed; Tetanus toxoids; Prednisone; Seldane; and Tetanus  toxoid    Social History:  The patient  reports that she quit smoking about 24 years ago. She has never used smokeless tobacco. She reports that she does not drink alcohol or use illicit drugs.   Family History:  The patient's family history includes COPD in her son; Cancer in her mother; Diabetes in her mother; Heart attack in her mother; Heart disease in her father. There is no history of Anesthesia problems, Hypotension, Malignant hyperthermia, or Pseudochol deficiency.    ROS:  Please see the history of present illness.  .   All other systems are reviewed and negative.    PHYSICAL EXAM: VS:  BP 115/50 mmHg  Pulse 77  SpO2 95% , BMI There is no weight on file to calculate BMI. GEN: Well nourished, well developed, in no acute distress HEENT: normal Neck: no JVD, carotid bruits, or masses Cardiac: RRR; no murmurs, rubs, or gallops,no edema  Respiratory:  clear to auscultation bilaterally, normal work of breathing GI: soft, nontender, nondistended, + BS MS: no deformity or atrophy Skin: warm and dry, no rash Neuro:  Strength and sensation are intact    EKG:  EKG was not ordered    Recent Labs: 10/05/2014: Magnesium 1.7; TSH 14.676* 11/01/2014: ALT 13; BUN 22; Creatinine 0.77; Hemoglobin 12.9; Platelets 203; Potassium 3.9; Sodium 142    Lipid Panel    Component Value Date/Time   CHOL 308* 10/05/2014 1605   TRIG 284* 10/05/2014 1605   HDL 48 10/05/2014 1605   CHOLHDL 6.4 10/05/2014 1605   VLDL 57* 10/05/2014 1605   LDLCALC 203* 10/05/2014 1605      Wt Readings from Last 3 Encounters:  11/04/14 179 lb (81.194 kg)  11/01/14 180 lb (81.647 kg)  10/05/14 185 lb 12.8 oz (84.278 kg)     ASSESSMENT AND PLAN:  1.  CAD  Denies sympotms  2.  HTN  BP is lower than it had been  Patient has been dizzy and fell once.  I would recomm stopping amlodipine for now  Will arrange for BP followup    3.  HL  Will arrange fro f/u labs    Signed, Dietrich PatesPaula Kahmya Pinkham, MD  11/08/2014 2:28 PM      [facility] [address], [place], [place]  [address] Phone: [phone]; Fax: [phone]

## 2014-11-08 NOTE — Telephone Encounter (Signed)
Patient called and states she has a tick bite that is red around the area.  Per Dr Oneta RackMcKeown, RX called in doxycycline 100 mg.

## 2014-11-08 NOTE — Patient Instructions (Signed)
Your physician has recommended you make the following change in your medication:  1.) stop amlodipine  Your physician recommends that you return for lab work in: 1 month (BMET, LIPIDS, AST, TSH)  Your physician recommends that you schedule a follow-up appointment in: 1 month for blood pressure check with the nurse.

## 2014-11-11 ENCOUNTER — Ambulatory Visit
Admission: RE | Admit: 2014-11-11 | Discharge: 2014-11-11 | Disposition: A | Discharge status: Home / Self Care | Payer: Medicare Other | Source: Ambulatory Visit | Attending: Surgery | Admitting: Surgery

## 2014-11-11 ENCOUNTER — Ambulatory Visit: Payer: Self-pay

## 2014-11-11 DIAGNOSIS — H43812 Vitreous degeneration, left eye: Secondary | ICD-10-CM | POA: Diagnosis not present

## 2014-11-11 DIAGNOSIS — M67412 Ganglion, left shoulder: Secondary | ICD-10-CM | POA: Diagnosis not present

## 2014-11-11 DIAGNOSIS — Z961 Presence of intraocular lens: Secondary | ICD-10-CM | POA: Diagnosis not present

## 2014-11-11 DIAGNOSIS — Z9889 Other specified postprocedural states: Secondary | ICD-10-CM | POA: Diagnosis not present

## 2014-11-13 ENCOUNTER — Other Ambulatory Visit: Payer: Federal, State, Local not specified - PPO

## 2014-11-13 ENCOUNTER — Inpatient Hospital Stay: Admission: RE | Admit: 2014-11-13 | Payer: Federal, State, Local not specified - PPO | Source: Ambulatory Visit

## 2014-11-29 DIAGNOSIS — M67412 Ganglion, left shoulder: Secondary | ICD-10-CM | POA: Diagnosis not present

## 2014-11-29 DIAGNOSIS — Z96612 Presence of left artificial shoulder joint: Secondary | ICD-10-CM | POA: Diagnosis not present

## 2014-11-29 DIAGNOSIS — Z471 Aftercare following joint replacement surgery: Secondary | ICD-10-CM | POA: Diagnosis not present

## 2014-12-02 ENCOUNTER — Encounter: Payer: Self-pay | Admitting: Internal Medicine

## 2014-12-02 ENCOUNTER — Ambulatory Visit (INDEPENDENT_AMBULATORY_CARE_PROVIDER_SITE_OTHER): Payer: Medicare Other | Admitting: Internal Medicine

## 2014-12-02 VITALS — BP 158/64 | HR 60 | Temp 97.8°F | Resp 18 | Ht 63.5 in | Wt 177.0 lb

## 2014-12-02 DIAGNOSIS — J069 Acute upper respiratory infection, unspecified: Secondary | ICD-10-CM

## 2014-12-02 MED ORDER — AZITHROMYCIN 250 MG PO TABS: ORAL_TABLET | ORAL | Status: DC

## 2014-12-02 MED ORDER — BENZONATATE 100 MG PO CAPS: 100.0000 mg | ORAL_CAPSULE | Freq: Four times a day (QID) | ORAL | Status: DC | PRN

## 2014-12-02 MED ORDER — DEXAMETHASONE SODIUM PHOSPHATE 100 MG/10ML IJ SOLN
10.0000 mg | Freq: Once | INTRAMUSCULAR | Status: AC
  Administered 2014-12-02: 10 mg via INTRAMUSCULAR

## 2014-12-02 NOTE — Patient Instructions (Signed)
Upper Respiratory Infection, Adult An upper respiratory infection (URI) is also sometimes known as the common cold. The upper respiratory tract includes the nose, sinuses, throat, trachea, and bronchi. Bronchi are the airways leading to the lungs. Most people improve within 1 week, but symptoms can last up to 2 weeks. A residual cough may last even longer.  CAUSES Many different viruses can infect the tissues lining the upper respiratory tract. The tissues become irritated and inflamed and often become very moist. Mucus production is also common. A cold is contagious. You can easily spread the virus to others by oral contact. This includes kissing, sharing a glass, coughing, or sneezing. Touching your mouth or nose and then touching a surface, which is then touched by another person, can also spread the virus. SYMPTOMS  Symptoms typically develop 1 to 3 days after you come in contact with a cold virus. Symptoms vary from person to person. They may include:  Runny nose.  Sneezing.  Nasal congestion.  Sinus irritation.  Sore throat.  Loss of voice (laryngitis).  Cough.  Fatigue.  Muscle aches.  Loss of appetite.  Headache.  Low-grade fever. DIAGNOSIS  You might diagnose your own cold based on familiar symptoms, since most people get a cold 2 to 3 times a year. Your caregiver can confirm this based on your exam. Most importantly, your caregiver can check that your symptoms are not due to another disease such as strep throat, sinusitis, pneumonia, asthma, or epiglottitis. Blood tests, throat tests, and X-rays are not necessary to diagnose a common cold, but they may sometimes be helpful in excluding other more serious diseases. Your caregiver will decide if any further tests are required. RISKS AND COMPLICATIONS  You may be at risk for a more severe case of the common cold if you smoke cigarettes, have chronic heart disease (such as heart failure) or lung disease (such as asthma), or if  you have a weakened immune system. The very young and very old are also at risk for more serious infections. Bacterial sinusitis, middle ear infections, and bacterial pneumonia can complicate the common cold. The common cold can worsen asthma and chronic obstructive pulmonary disease (COPD). Sometimes, these complications can require emergency medical care and may be life-threatening. PREVENTION  The best way to protect against getting a cold is to practice good hygiene. Avoid oral or hand contact with people with cold symptoms. Wash your hands often if contact occurs. There is no clear evidence that vitamin C, vitamin E, echinacea, or exercise reduces the chance of developing a cold. However, it is always recommended to get plenty of rest and practice good nutrition. TREATMENT  Treatment is directed at relieving symptoms. There is no cure. Antibiotics are not effective, because the infection is caused by a virus, not by bacteria. Treatment may include:  Increased fluid intake. Sports drinks offer valuable electrolytes, sugars, and fluids.  Breathing heated mist or steam (vaporizer or shower).  Eating chicken soup or other clear broths, and maintaining good nutrition.  Getting plenty of rest.  Using gargles or lozenges for comfort.  Controlling fevers with ibuprofen or acetaminophen as directed by your caregiver.  Increasing usage of your inhaler if you have asthma. Zinc gel and zinc lozenges, taken in the first 24 hours of the common cold, can shorten the duration and lessen the severity of symptoms. Pain medicines may help with fever, muscle aches, and throat pain. A variety of non-prescription medicines are available to treat congestion and runny nose. Your caregiver   can make recommendations and may suggest nasal or lung inhalers for other symptoms.  HOME CARE INSTRUCTIONS   Only take over-the-counter or prescription medicines for pain, discomfort, or fever as directed by your  caregiver.  Use a warm mist humidifier or inhale steam from a shower to increase air moisture. This may keep secretions moist and make it easier to breathe.  Drink enough water and fluids to keep your urine clear or pale yellow.  Rest as needed.  Return to work when your temperature has returned to normal or as your caregiver advises. You may need to stay home longer to avoid infecting others. You can also use a face mask and careful hand washing to prevent spread of the virus. SEEK MEDICAL CARE IF:   After the first few days, you feel you are getting worse rather than better.  You need your caregiver's advice about medicines to control symptoms.  You develop chills, worsening shortness of breath, or brown or red sputum. These may be signs of pneumonia.  You develop yellow or brown nasal discharge or pain in the face, especially when you bend forward. These may be signs of sinusitis.  You develop a fever, swollen neck glands, pain with swallowing, or white areas in the back of your throat. These may be signs of strep throat. SEEK IMMEDIATE MEDICAL CARE IF:   You have a fever.  You develop severe or persistent headache, ear pain, sinus pain, or chest pain.  You develop wheezing, a prolonged cough, cough up blood, or have a change in your usual mucus (if you have chronic lung disease).  You develop sore muscles or a stiff neck. Document Released: 01/16/2001 Document Revised: 10/15/2011 Document Reviewed: 10/28/2013 Exit[employer] Patient Information 2015 [employer]. This information is not intended to replace advice given to you by your health care provider. Make sure you discuss any questions you have with your health care provider.  

## 2014-12-02 NOTE — Progress Notes (Signed)
Patient ID: [name], female   DOB: 1936/03/06, 79 y.o.   MRN: [id number]  HPI  Patient presents to the office for evaluation of cough.  It has been going on for 4 days.  Patient reports night > day, dry, worse with lying down.  They also endorse change in voice, postnasal drip and nasal congestion, clear rhinorrhea, sore throat, and popping in her ears. .  They have tried antitussives or antihistamines.  They report that nothing has worked.  They denies other sick contacts.  She does suffer from very bad seasonal allergies.    Review of Systems  Constitutional: Negative for fever and chills.  HENT: Positive for congestion and sore throat. Negative for ear pain.   Respiratory: Positive for cough. Negative for shortness of breath and wheezing.   Cardiovascular: Negative for chest pain, palpitations and claudication.  Skin: Negative.   Neurological: Positive for headaches.    PE:  General:  Alert and non-toxic, WDWN, NAD HEENT: NCAT, PERLA, EOM normal, no occular discharge or erythema.  Nasal mucosal edema with sinus tenderness to palpation.  Oropharynx clear with minimal oropharyngeal edema and erythema.  Mucous membranes moist and pink. Neck:  Cervical adenopathy Chest:  RRR no MRGs.  Lungs clear to auscultation A&P with no wheezes rhonchi or rales.   Abdomen: +BS x 4 quadrants, soft, non-tender, no guarding, rigidity, or rebound. Skin: warm and dry no rash Neuro: A&Ox4, CN II-XII grossly intact  Assessment and Plan:   1. Acute URI -zpak -decadron -tessalon -nasal saline -f/u prn

## 2014-12-08 ENCOUNTER — Encounter: Payer: Self-pay | Admitting: *Deleted

## 2014-12-08 ENCOUNTER — Ambulatory Visit (INDEPENDENT_AMBULATORY_CARE_PROVIDER_SITE_OTHER): Payer: Medicare Other | Admitting: *Deleted

## 2014-12-08 ENCOUNTER — Other Ambulatory Visit: Payer: Self-pay | Admitting: Internal Medicine

## 2014-12-08 ENCOUNTER — Other Ambulatory Visit (INDEPENDENT_AMBULATORY_CARE_PROVIDER_SITE_OTHER): Payer: Medicare Other | Admitting: *Deleted

## 2014-12-08 VITALS — BP 140/58 | HR 59 | Ht 63.5 in | Wt 175.5 lb

## 2014-12-08 DIAGNOSIS — I779 Disorder of arteries and arterioles, unspecified: Secondary | ICD-10-CM

## 2014-12-08 DIAGNOSIS — I739 Peripheral vascular disease, unspecified: Secondary | ICD-10-CM

## 2014-12-08 DIAGNOSIS — I10 Essential (primary) hypertension: Secondary | ICD-10-CM | POA: Diagnosis not present

## 2014-12-08 DIAGNOSIS — E039 Hypothyroidism, unspecified: Secondary | ICD-10-CM | POA: Diagnosis not present

## 2014-12-08 LAB — BASIC METABOLIC PANEL
BUN: 19 mg/dL (ref 6–23)
CO2: 26 meq/L (ref 19–32)
Calcium: 9.6 mg/dL (ref 8.4–10.5)
Chloride: 105 mEq/L (ref 96–112)
Creatinine, Ser: 0.79 mg/dL (ref 0.40–1.20)
GFR: 75.11 mL/min (ref >60.00)
Glucose, Bld: 100 mg/dL — ABNORMAL HIGH (ref 70–99)
Potassium: 4.1 mEq/L (ref 3.5–5.1)
SODIUM: 141 meq/L (ref 135–145)

## 2014-12-08 LAB — LIPID PANEL
CHOL/HDL RATIO: 3
Cholesterol: 116 mg/dL (ref 0–200)
HDL: 37.3 mg/dL — ABNORMAL LOW (ref >39.00)
LDL CALC: 56 mg/dL (ref 0–99)
NonHDL: 78.7
Triglycerides: 114 mg/dL (ref 0.0–149.0)
VLDL: 22.8 mg/dL (ref 0.0–40.0)

## 2014-12-08 LAB — AST: AST: 11 U/L (ref 0–37)

## 2014-12-08 LAB — TSH: TSH: 0.14 u[IU]/mL — ABNORMAL LOW (ref 0.35–4.50)

## 2014-12-08 NOTE — Progress Notes (Signed)
1.) Reason for visit: BP check. Amlodipine 5 mg once a day was discontinued  Name of MD requesting visit: Dr Tenny Crawoss 2.) H&P: Pt has a history of hypertension  3.) ROS related to problem: On her last office visit pt's BP was 115/50. Pt C/O of dizziness. BP today is 140/58 taken manually   HR 59 beats/minute. Pt is feeling fine, no dyzzines .  4.) Assessment and plan per MD: Will send  BP reading to Dr Tenny Crawoss for recommendations.

## 2014-12-09 DIAGNOSIS — F330 Major depressive disorder, recurrent, mild: Secondary | ICD-10-CM | POA: Diagnosis not present

## 2014-12-12 NOTE — Progress Notes (Signed)
Agree with keeping off of amlodipine for now.

## 2014-12-27 DIAGNOSIS — F331 Major depressive disorder, recurrent, moderate: Secondary | ICD-10-CM | POA: Diagnosis not present

## 2015-01-05 DIAGNOSIS — M5136 Other intervertebral disc degeneration, lumbar region: Secondary | ICD-10-CM | POA: Diagnosis not present

## 2015-01-05 DIAGNOSIS — G894 Chronic pain syndrome: Secondary | ICD-10-CM | POA: Diagnosis not present

## 2015-01-05 DIAGNOSIS — M2660 Temporomandibular joint disorder, unspecified: Secondary | ICD-10-CM | POA: Diagnosis not present

## 2015-01-06 ENCOUNTER — Encounter: Payer: Self-pay | Admitting: Internal Medicine

## 2015-01-06 ENCOUNTER — Ambulatory Visit (INDEPENDENT_AMBULATORY_CARE_PROVIDER_SITE_OTHER): Payer: Medicare Other | Admitting: Internal Medicine

## 2015-01-06 VITALS — BP 176/82 | HR 58 | Temp 98.2°F | Resp 16 | Ht 63.5 in | Wt 180.0 lb

## 2015-01-06 DIAGNOSIS — G4700 Insomnia, unspecified: Secondary | ICD-10-CM

## 2015-01-06 DIAGNOSIS — Z79899 Other long term (current) drug therapy: Secondary | ICD-10-CM | POA: Diagnosis not present

## 2015-01-06 DIAGNOSIS — E559 Vitamin D deficiency, unspecified: Secondary | ICD-10-CM

## 2015-01-06 DIAGNOSIS — E1129 Type 2 diabetes mellitus with other diabetic kidney complication: Secondary | ICD-10-CM

## 2015-01-06 DIAGNOSIS — I10 Essential (primary) hypertension: Secondary | ICD-10-CM | POA: Diagnosis not present

## 2015-01-06 DIAGNOSIS — E785 Hyperlipidemia, unspecified: Secondary | ICD-10-CM

## 2015-01-06 DIAGNOSIS — E039 Hypothyroidism, unspecified: Secondary | ICD-10-CM | POA: Diagnosis not present

## 2015-01-06 MED ORDER — TRAZODONE HCL 50 MG PO TABS: 50.0000 mg | ORAL_TABLET | Freq: Every day | ORAL | Status: AC

## 2015-01-06 NOTE — Progress Notes (Signed)
Patient ID: [name], female   DOB: November 27, 1935, 79 y.o.   MRN: [id number]  Assessment and Plan:  Hypertension:  -Continue medication -monitor blood pressure at home. -Continue DASH diet -Reminder to go to the ER if any CP, SOB, nausea, dizziness, severe HA, changes vision/speech, left arm numbness and tingling and jaw pain.  Cholesterol - Continue diet and exercise -Check cholesterol.   Diabetes without complications -Continue diet and exercise.  -Check A1C  Vitamin D Def -check level -continue medications.   Insomnia -stop xanax -start trazedone  Depression -cont meds -see counselor -if continues to be bad consider increasing wellbutrin  Continue diet and meds as discussed. Further disposition pending results of labs. Discussed med's effects and SE's.    HPI 79 y.o. female  presents for 3 month follow up with hypertension, hyperlipidemia, diabetes and vitamin D deficiency.   Her blood pressure has been controlled at home, today their BP is BP: (!) 176/82 mmHg.She does not workout. She denies chest pain, shortness of breath, dizziness.  She reports that at home she runs 140/60.     She is on cholesterol medication and denies myalgias. Her cholesterol is at goal. The cholesterol was:  12/08/2014: Cholesterol, Total 116; HDL-C 37.30*; LDL (calc) 56; Triglycerides 114.0   She has not been working on diet and exercise for diabetes without complications, she is on bASA, she is on ACE/ARB, and denies  foot ulcerations, hyperglycemia, hypoglycemia , increased appetite, nausea, paresthesia of the feet, polydipsia, polyuria, visual disturbances, vomiting and weight loss. Last A1C was: 10/05/2014: Hemoglobin-A1c 6.0*   Patient is on Vitamin D supplement. 10/05/2014: VITD 31  Patient reports that she has been a lot more depressed lately and has been seeing her counselor.  She reports that she feels like crying a lot.  She has been eating eclairs.  Current Medications:  Current  Outpatient Prescriptions on File Prior to Visit  Medication Sig Dispense Refill  . ALPRAZolam (XANAX) 0.25 MG tablet Take 0.25 mg by mouth daily as needed. Anxiety As needed    . buPROPion (WELLBUTRIN) 100 MG tablet Take 100 mg by mouth 2 (two) times daily.    . Cholecalciferol (VITAMIN D PO) Take 1 capsule by mouth daily.     Marland Kitchen. escitalopram (LEXAPRO) 10 MG tablet Take 10 mg by mouth 2 (two) times daily.     . hydrochlorothiazide (HYDRODIURIL) 25 MG tablet Take 1 tablet daily for BP & Fluid 90 tablet 1  . HYDROcodone-acetaminophen (NORCO) 10-325 MG per tablet Take 1 tablet by mouth every 6 (six) hours as needed.    . lamoTRIgine (LAMICTAL) 25 MG tablet Take 25 mg by mouth. Takes 2 tabs at bedtime.    Marland Kitchen. levothyroxine (SYNTHROID, LEVOTHROID) 175 MCG tablet Take 1 tablet (175 mcg total) by mouth daily. 90 tablet 1  . lisinopril (PRINIVIL,ZESTRIL) 40 MG tablet Take 1 tablet daily for BP & Kidneys 90 tablet 1  . MAGNESIUM PO Take 1 tablet by mouth daily.    . modafinil (PROVIGIL) 200 MG tablet TAKE ONE TABLET BY MOUTH TWICE DAILY 180 tablet 0  . omeprazole (PRILOSEC) 40 MG capsule TAKE ONE CAPSULE BY MOUTH ONCE DAILY FOR  HEARTBURN 30 capsule 3  . rosuvastatin (CRESTOR) 20 MG tablet Take 1 tablet (20 mg total) by mouth daily. 90 tablet 3   No current facility-administered medications on file prior to visit.   Medical History:  Past Medical History  Diagnosis Date  . Dyslipidemia   . Sleep apnea  5 yrs --cpap  does not use   . Heart murmur   . H/O hiatal hernia   . Arthritis   . Hyperlipidemia   . Hypertension   . GERD (gastroesophageal reflux disease)   . Depression   . Diabetes mellitus     diet controlled  . Hypothyroidism    Allergies:  Allergies  Allergen Reactions  . Iodinated Diagnostic Agents Anaphylaxis  . Iohexol Anaphylaxis     Desc: THROAT SWELLED/ PROBLEMS BREATHING WITH IVP DYE 10 PLUS YEARS AGO  Desc: THROAT SWELLED/ PROBLEMS BREATHING WITH IVP DYE 10 PLUS YEARS  AGO   . Lipitor [Atorvastatin] Other (See Comments)    Other reaction(s): Myalgias (intolerance) Myalgias Myalgias   . Septra [Sulfamethoxazole-Trimethoprim] Other (See Comments)    Patient can't remenber  . Tetanus Toxoid Adsorbed Anaphylaxis  . Tetanus Toxoids Anaphylaxis  . Prednisone Other (See Comments)    Insomnia, increased depression  . Seldane [Terfenadine] Other (See Comments)    Other reaction(s): Other (See Comments) Hair loss Hair loss  . Tetanus Toxoid Swelling     Review of Systems:  Review of Systems  Constitutional: Positive for malaise/fatigue. Negative for fever, chills, weight loss and diaphoresis.  HENT: Negative for congestion, ear pain, nosebleeds and sore throat.   Eyes: Negative.   Respiratory: Negative for cough, shortness of breath and wheezing.   Cardiovascular: Negative for chest pain, palpitations and leg swelling.  Gastrointestinal: Positive for diarrhea and constipation. Negative for heartburn, nausea, vomiting, blood in stool and melena.  Genitourinary: Negative for dysuria, urgency and frequency.  Skin: Negative.        Patient feels like her lipoma is getting bigger  Neurological: Negative for dizziness, sensory change and headaches.  Psychiatric/Behavioral: Positive for depression. The patient has insomnia. The patient is not nervous/anxious.     Family history- Review and unchanged  Social history- Review and unchanged  Physical Exam: BP 176/82 mmHg  Pulse 58  Temp(Src) 98.2 F (36.8 C) (Temporal)  Resp 16  Ht 5' 3.5" (1.613 m)  Wt 180 lb (81.647 kg)  BMI 31.38 kg/m2 Wt Readings from Last 3 Encounters:  01/06/15 180 lb (81.647 kg)  12/08/14 175 lb 8 oz (79.606 kg)  12/02/14 177 lb (80.287 kg)   General Appearance: Well nourished well developed, non-toxic appearing, in no apparent distress. Eyes: PERRLA, EOMs, conjunctiva no swelling or erythema ENT/Mouth: Ear canals clear with no erythema, swelling, or discharge.  TMs normal  bilaterally, oropharynx clear, moist, with no exudate.   Neck: Supple, thyroid normal, no JVD, no cervical adenopathy.  Respiratory: Respiratory effort normal, breath sounds clear A&P, no wheeze, rhonchi or rales noted.  No retractions, no accessory muscle usage Cardio: RRR with no MRGs. No noted edema.  Abdomen: Soft, + BS.  Non tender, no guarding, rebound, hernias, masses. Musculoskeletal: Full ROM, 5/5 strength, Normal gait Skin: Warm, dry without rashes, lesions, ecchymosis.  Neuro: Awake and oriented X 3, Cranial nerves intact. No cerebellar symptoms.  Psych: normal affect, Insight and Judgment appropriate.    FORCUCCI, Elleigh Cassetta, PA-C 2:46 PM [facility]

## 2015-01-06 NOTE — Patient Instructions (Signed)
Trazodone tablets What is this medicine? TRAZODONE (TRAZ oh done) is used to treat depression. This medicine may be used for other purposes; ask your health care provider or pharmacist if you have questions. COMMON BRAND NAME(S): Desyrel What should I tell my health care provider before I take this medicine? They need to know if you have any of these conditions: -attempted suicide or thinking about it -bipolar disorder -bleeding problems -glaucoma -heart disease, or previous heart attack -irregular heart beat -kidney or liver disease -low levels of sodium in the blood -an unusual or allergic reaction to trazodone, other medicines, foods, dyes or preservatives -pregnant or trying to get pregnant -breast-feeding How should I use this medicine? Take this medicine by mouth with a glass of water. Follow the directions on the prescription label. Take this medicine shortly after a meal or a light snack. Take your medicine at regular intervals. Do not take your medicine more often than directed. Do not stop taking this medicine suddenly except upon the advice of your doctor. Stopping this medicine too quickly may cause serious side effects or your condition may worsen. A special MedGuide will be given to you by the pharmacist with each prescription and refill. Be sure to read this information carefully each time. Talk to your pediatrician regarding the use of this medicine in children. Special care may be needed. Overdosage: If you think you have taken too much of this medicine contact a poison control center or emergency room at once. NOTE: This medicine is only for you. Do not share this medicine with others. What if I miss a dose? If you miss a dose, take it as soon as you can. If it is almost time for your next dose, take only that dose. Do not take double or extra doses. What may interact with this medicine? Do not take this medicine with any of the following medications: -certain medicines  for fungal infections like fluconazole, itraconazole, ketoconazole, posaconazole, voriconazole -cisapride -dofetilide -dronedarone -linezolid -MAOIs like Carbex, Eldepryl, Marplan, Nardil, and Parnate -mesoridazine -methylene blue (injected into a vein) -pimozide -saquinavir -thioridazine -ziprasidone This medicine may also interact with the following medications: -alcohol -antiviral medicines for HIV or AIDS -aspirin and aspirin-like medicines -barbiturates like phenobarbital -certain medicines for blood pressure, heart disease, irregular heart beat -certain medicines for depression, anxiety, or psychotic disturbances -certain medicines for migraine headache like almotriptan, eletriptan, frovatriptan, naratriptan, rizatriptan, sumatriptan, zolmitriptan -certain medicines for seizures like carbamazepine and phenytoin -certain medicines for sleep -certain medicines that treat or prevent blood clots like dalteparin, enoxaparin, warfarin -digoxin -fentanyl -lithium -NSAIDS, medicines for pain and inflammation, like ibuprofen or naproxen -other medicines that prolong the QT interval (cause an abnormal heart rhythm) -rasagiline -supplements like St. John's wort, kava kava, valerian -tramadol -tryptophan This list may not describe all possible interactions. Give your health care provider a list of all the medicines, herbs, non-prescription drugs, or dietary supplements you use. Also tell them if you smoke, drink alcohol, or use illegal drugs. Some items may interact with your medicine. What should I watch for while using this medicine? Tell your doctor if your symptoms do not get better or if they get worse. Visit your doctor or health care professional for regular checks on your progress. Because it may take several weeks to see the full effects of this medicine, it is important to continue your treatment as prescribed by your doctor. Patients and their families should watch out for new  or worsening thoughts of suicide or depression. Also   watch out for sudden changes in feelings such as feeling anxious, agitated, panicky, irritable, hostile, aggressive, impulsive, severely restless, overly excited and hyperactive, or not being able to sleep. If this happens, especially at the beginning of treatment or after a change in dose, call your health care professional. You may get drowsy or dizzy. Do not drive, use machinery, or do anything that needs mental alertness until you know how this medicine affects you. Do not stand or sit up quickly, especially if you are an older patient. This reduces the risk of dizzy or fainting spells. Alcohol may interfere with the effect of this medicine. Avoid alcoholic drinks. This medicine may cause dry eyes and blurred vision. If you wear contact lenses you may feel some discomfort. Lubricating drops may help. See your eye doctor if the problem does not go away or is severe. Your mouth may get dry. Chewing sugarless gum, sucking hard candy and drinking plenty of water may help. Contact your doctor if the problem does not go away or is severe. What side effects may I notice from receiving this medicine? Side effects that you should report to your doctor or health care professional as soon as possible: -allergic reactions like skin rash, itching or hives, swelling of the face, lips, or tongue -fast, irregular heartbeat -feeling faint or lightheaded, falls -painful erections or other sexual dysfunction -suicidal thoughts or other mood changes -trembling Side effects that usually do not require medical attention (report to your doctor or health care professional if they continue or are bothersome): -constipation -headache -muscle aches or pains -nausea, vomiting -unusually weak or tired This list may not describe all possible side effects. Call your doctor for medical advice about side effects. You may report side effects to FDA at 1-800-FDA-1088. Where  should I keep my medicine? Keep out of the reach of children. Store at room temperature between 15 and 30 degrees C (59 to 86 degrees F). Protect from light. Keep container tightly closed. Throw away any unused medicine after the expiration date. NOTE: This sheet is a summary. It may not cover all possible information. If you have questions about this medicine, talk to your doctor, pharmacist, or health care provider.  2015, [employer]. (2013-02-23 15:46:28)  

## 2015-01-07 LAB — CBC WITH DIFFERENTIAL/PLATELET
BASOS ABS: 0.1 10*3/uL (ref 0.0–0.1)
BASOS PCT: 1 % (ref 0–1)
Eosinophils Absolute: 0.2 10*3/uL (ref 0.0–0.7)
Eosinophils Relative: 3 % (ref 0–5)
HCT: 38 % (ref 36.0–46.0)
Hemoglobin: 12.7 g/dL (ref 12.0–15.0)
Lymphocytes Relative: 34 % (ref 12–46)
Lymphs Abs: 2.2 10*3/uL (ref 0.7–4.0)
MCH: 30.8 pg (ref 26.0–34.0)
MCHC: 33.4 g/dL (ref 30.0–36.0)
MCV: 92 fL (ref 78.0–100.0)
MPV: 9.1 fL (ref 8.6–12.4)
Monocytes Absolute: 0.4 10*3/uL (ref 0.1–1.0)
Monocytes Relative: 6 % (ref 3–12)
NEUTROS ABS: 3.6 10*3/uL (ref 1.7–7.7)
NEUTROS PCT: 56 % (ref 43–77)
Platelets: 205 10*3/uL (ref 150–400)
RBC: 4.13 MIL/uL (ref 3.87–5.11)
RDW: 14.3 % (ref 11.5–15.5)
WBC: 6.4 10*3/uL (ref 4.0–10.5)

## 2015-01-07 LAB — BASIC METABOLIC PANEL WITH GFR
BUN: 12 mg/dL (ref 6–23)
CALCIUM: 9.3 mg/dL (ref 8.4–10.5)
CO2: 26 meq/L (ref 19–32)
Chloride: 104 mEq/L (ref 96–112)
Creat: 0.75 mg/dL (ref 0.50–1.10)
GFR, Est African American: >89 mL/min
GFR, Est Non African American: 78 mL/min
GLUCOSE: 89 mg/dL (ref 70–99)
Potassium: 4 mEq/L (ref 3.5–5.3)
Sodium: 142 mEq/L (ref 135–145)

## 2015-01-07 LAB — HEPATIC FUNCTION PANEL
ALT: 10 U/L (ref 0–35)
AST: 13 U/L (ref 0–37)
Albumin: 3.9 g/dL (ref 3.5–5.2)
Alkaline Phosphatase: 79 U/L (ref 39–117)
BILIRUBIN INDIRECT: 0.2 mg/dL (ref 0.2–1.2)
Bilirubin, Direct: 0.1 mg/dL (ref 0.0–0.3)
TOTAL PROTEIN: 6.1 g/dL (ref 6.0–8.3)
Total Bilirubin: 0.3 mg/dL (ref 0.2–1.2)

## 2015-01-07 LAB — LIPID PANEL
CHOL/HDL RATIO: 4.2 ratio
CHOLESTEROL: 188 mg/dL (ref 0–200)
HDL: 45 mg/dL — AB (ref >=46)
LDL CALC: 84 mg/dL (ref 0–99)
TRIGLYCERIDES: 293 mg/dL — AB (ref <150)
VLDL: 59 mg/dL — ABNORMAL HIGH (ref 0–40)

## 2015-01-07 LAB — HEMOGLOBIN A1C
HEMOGLOBIN A1C: 5.9 % — AB (ref <5.7)
Mean Plasma Glucose: 123 mg/dL — ABNORMAL HIGH (ref <117)

## 2015-01-07 LAB — TSH: TSH: 3.202 u[IU]/mL (ref 0.350–4.500)

## 2015-01-07 LAB — INSULIN, RANDOM: Insulin: 8.9 u[IU]/mL (ref 2.0–19.6)

## 2015-01-07 LAB — MAGNESIUM: MAGNESIUM: 1.6 mg/dL (ref 1.5–2.5)

## 2015-01-09 LAB — VITAMIN D 1,25 DIHYDROXY
VITAMIN D 1, 25 (OH) TOTAL: 60 pg/mL (ref 18–72)
Vitamin D2 1, 25 (OH)2: <8 pg/mL
Vitamin D3 1, 25 (OH)2: 60 pg/mL

## 2015-01-11 ENCOUNTER — Other Ambulatory Visit: Payer: Self-pay | Admitting: Internal Medicine

## 2015-01-11 MED ORDER — ARMODAFINIL 150 MG PO TABS: 150.0000 mg | ORAL_TABLET | Freq: Every day | ORAL | Status: DC

## 2015-01-13 DIAGNOSIS — Z961 Presence of intraocular lens: Secondary | ICD-10-CM | POA: Diagnosis not present

## 2015-01-13 DIAGNOSIS — H43812 Vitreous degeneration, left eye: Secondary | ICD-10-CM | POA: Diagnosis not present

## 2015-01-14 ENCOUNTER — Encounter: Payer: Self-pay | Admitting: Internal Medicine

## 2015-01-14 ENCOUNTER — Ambulatory Visit (INDEPENDENT_AMBULATORY_CARE_PROVIDER_SITE_OTHER): Payer: Medicare Other | Admitting: Internal Medicine

## 2015-01-14 VITALS — BP 146/62 | HR 58 | Temp 98.2°F | Resp 18 | Ht 63.5 in | Wt 180.0 lb

## 2015-01-14 DIAGNOSIS — J309 Allergic rhinitis, unspecified: Secondary | ICD-10-CM | POA: Diagnosis not present

## 2015-01-14 MED ORDER — CETIRIZINE HCL 10 MG PO CAPS: 10.0000 mg | ORAL_CAPSULE | Freq: Every day | ORAL | Status: DC

## 2015-01-14 MED ORDER — BENZONATATE 100 MG PO CAPS: 100.0000 mg | ORAL_CAPSULE | Freq: Four times a day (QID) | ORAL | Status: DC | PRN

## 2015-01-14 MED ORDER — MONTELUKAST SODIUM 10 MG PO TABS: 10.0000 mg | ORAL_TABLET | Freq: Every day | ORAL | Status: DC

## 2015-01-14 MED ORDER — DEXAMETHASONE SODIUM PHOSPHATE 100 MG/10ML IJ SOLN
10.0000 mg | Freq: Once | INTRAMUSCULAR | Status: AC
  Administered 2015-01-14: 10 mg via INTRAMUSCULAR

## 2015-01-14 NOTE — Progress Notes (Signed)
Patient ID: [name], female   DOB: 1936-04-22, 79 y.o.   MRN: [id number]  HPI  Patient presents to the office for evaluation of cough.  It has been going on for 2 days.  Patient reports night > day, dry, worse with lying down.  They also endorse change in voice, postnasal drip and nasal congestion, runny nose, sneezing.  Itchy watery eyes.  .  They have tried antihistamines.  They report that nothing has worked.  They denies other sick contacts.  Review of Systems  Constitutional: Negative for fever, chills and malaise/fatigue.  HENT: Positive for congestion. Negative for sore throat.   Eyes: Positive for discharge (watery eyes).  Respiratory: Positive for cough. Negative for shortness of breath and wheezing.   Cardiovascular: Negative for chest pain, palpitations and leg swelling.  Skin: Negative.   Neurological: Negative for headaches.    PE:  Filed Vitals:   01/14/15 1031  BP: 146/62  Pulse: 58  Temp: 98.2 F (36.8 C)  Resp: 18    General:  Alert and non-toxic, WDWN, NAD HEENT: NCAT, PERLA, EOM normal, no occular discharge or erythema.  Nasal mucosal edema with sinus tenderness to palpation.  Oropharynx clear with minimal oropharyngeal edema and erythema.  Mucous membranes moist and pink. Neck:  Cervical adenopathy Chest:  RRR no MRGs.  Lungs clear to auscultation A&P with no wheezes rhonchi or rales.   Abdomen: +BS x 4 quadrants, soft, non-tender, no guarding, rigidity, or rebound. Skin: warm and dry no rash Neuro: A&Ox4, CN II-XII grossly intact  Assessment and Plan:   1. Allergic rhinitis, unspecified allergic rhinitis type -decadron 10 mg IM -singulair -zyrtec -tessalon -nasal saline -offered referral to allergist which she declined.

## 2015-01-14 NOTE — Addendum Note (Signed)
Addended by: Otha Rickles A on: 01/14/2015 11:25 AM   Modules accepted: Orders

## 2015-01-14 NOTE — Patient Instructions (Signed)

## 2015-01-20 ENCOUNTER — Telehealth: Payer: Self-pay

## 2015-01-20 MED ORDER — DOXYCYCLINE HYCLATE 100 MG PO TABS: 100.0000 mg | ORAL_TABLET | Freq: Every day | ORAL | Status: DC

## 2015-01-20 NOTE — Telephone Encounter (Signed)
Pt has tick bite and is asking if she needs to take ABX. She was in recently for tick bite and was given ABX. Per Dr.McKeown, pt can take Doxycycline 100mg  1 tab QD w/ food for 3 days when she finds tick bite. Rx was sent to [facility]. Pt aware.

## 2015-02-09 DIAGNOSIS — M1711 Unilateral primary osteoarthritis, right knee: Secondary | ICD-10-CM | POA: Diagnosis not present

## 2015-02-16 DIAGNOSIS — M1711 Unilateral primary osteoarthritis, right knee: Secondary | ICD-10-CM | POA: Diagnosis not present

## 2015-02-23 ENCOUNTER — Encounter: Payer: Self-pay | Admitting: Emergency Medicine

## 2015-02-23 DIAGNOSIS — M1711 Unilateral primary osteoarthritis, right knee: Secondary | ICD-10-CM | POA: Diagnosis not present

## 2015-03-09 DIAGNOSIS — F330 Major depressive disorder, recurrent, mild: Secondary | ICD-10-CM | POA: Diagnosis not present

## 2015-03-14 DIAGNOSIS — M7072 Other bursitis of hip, left hip: Secondary | ICD-10-CM | POA: Diagnosis not present

## 2015-03-14 DIAGNOSIS — Z96642 Presence of left artificial hip joint: Secondary | ICD-10-CM | POA: Diagnosis not present

## 2015-03-14 DIAGNOSIS — M25552 Pain in left hip: Secondary | ICD-10-CM | POA: Diagnosis not present

## 2015-03-24 DIAGNOSIS — M1612 Unilateral primary osteoarthritis, left hip: Secondary | ICD-10-CM | POA: Diagnosis not present

## 2015-03-28 DIAGNOSIS — Z471 Aftercare following joint replacement surgery: Secondary | ICD-10-CM | POA: Diagnosis not present

## 2015-03-28 DIAGNOSIS — M67412 Ganglion, left shoulder: Secondary | ICD-10-CM | POA: Diagnosis not present

## 2015-03-28 DIAGNOSIS — Z96612 Presence of left artificial shoulder joint: Secondary | ICD-10-CM | POA: Diagnosis not present

## 2015-04-06 DIAGNOSIS — M1612 Unilateral primary osteoarthritis, left hip: Secondary | ICD-10-CM | POA: Diagnosis not present

## 2015-04-08 DIAGNOSIS — M1612 Unilateral primary osteoarthritis, left hip: Secondary | ICD-10-CM | POA: Diagnosis not present

## 2015-04-13 DIAGNOSIS — M1612 Unilateral primary osteoarthritis, left hip: Secondary | ICD-10-CM | POA: Diagnosis not present

## 2015-04-15 DIAGNOSIS — M1612 Unilateral primary osteoarthritis, left hip: Secondary | ICD-10-CM | POA: Diagnosis not present

## 2015-04-26 ENCOUNTER — Ambulatory Visit (INDEPENDENT_AMBULATORY_CARE_PROVIDER_SITE_OTHER): Payer: Medicare Other | Admitting: Physician Assistant

## 2015-04-26 ENCOUNTER — Encounter: Payer: Self-pay | Admitting: Physician Assistant

## 2015-04-26 VITALS — BP 200/92 | HR 60 | Temp 98.5°F | Resp 18 | Ht 63.5 in | Wt 181.8 lb

## 2015-04-26 DIAGNOSIS — Z23 Encounter for immunization: Secondary | ICD-10-CM

## 2015-04-26 DIAGNOSIS — D649 Anemia, unspecified: Secondary | ICD-10-CM

## 2015-04-26 DIAGNOSIS — I2510 Atherosclerotic heart disease of native coronary artery without angina pectoris: Secondary | ICD-10-CM

## 2015-04-26 DIAGNOSIS — E559 Vitamin D deficiency, unspecified: Secondary | ICD-10-CM | POA: Diagnosis not present

## 2015-04-26 DIAGNOSIS — Z9181 History of falling: Secondary | ICD-10-CM

## 2015-04-26 DIAGNOSIS — E1129 Type 2 diabetes mellitus with other diabetic kidney complication: Secondary | ICD-10-CM

## 2015-04-26 DIAGNOSIS — I739 Peripheral vascular disease, unspecified: Secondary | ICD-10-CM

## 2015-04-26 DIAGNOSIS — M25561 Pain in right knee: Secondary | ICD-10-CM

## 2015-04-26 DIAGNOSIS — R6889 Other general symptoms and signs: Secondary | ICD-10-CM | POA: Diagnosis not present

## 2015-04-26 DIAGNOSIS — I10 Essential (primary) hypertension: Secondary | ICD-10-CM | POA: Diagnosis not present

## 2015-04-26 DIAGNOSIS — M1990 Unspecified osteoarthritis, unspecified site: Secondary | ICD-10-CM

## 2015-04-26 DIAGNOSIS — Z79899 Other long term (current) drug therapy: Secondary | ICD-10-CM

## 2015-04-26 DIAGNOSIS — Z0000 Encounter for general adult medical examination without abnormal findings: Secondary | ICD-10-CM

## 2015-04-26 DIAGNOSIS — E039 Hypothyroidism, unspecified: Secondary | ICD-10-CM

## 2015-04-26 DIAGNOSIS — I779 Disorder of arteries and arterioles, unspecified: Secondary | ICD-10-CM

## 2015-04-26 DIAGNOSIS — Z0001 Encounter for general adult medical examination with abnormal findings: Secondary | ICD-10-CM | POA: Diagnosis not present

## 2015-04-26 DIAGNOSIS — E669 Obesity, unspecified: Secondary | ICD-10-CM

## 2015-04-26 DIAGNOSIS — E782 Mixed hyperlipidemia: Secondary | ICD-10-CM | POA: Diagnosis not present

## 2015-04-26 DIAGNOSIS — R2689 Other abnormalities of gait and mobility: Secondary | ICD-10-CM

## 2015-04-26 DIAGNOSIS — K219 Gastro-esophageal reflux disease without esophagitis: Secondary | ICD-10-CM

## 2015-04-26 DIAGNOSIS — G4733 Obstructive sleep apnea (adult) (pediatric): Secondary | ICD-10-CM

## 2015-04-26 DIAGNOSIS — G43109 Migraine with aura, not intractable, without status migrainosus: Secondary | ICD-10-CM

## 2015-04-26 DIAGNOSIS — F419 Anxiety disorder, unspecified: Secondary | ICD-10-CM

## 2015-04-26 DIAGNOSIS — F32A Depression, unspecified: Secondary | ICD-10-CM

## 2015-04-26 DIAGNOSIS — G43809 Other migraine, not intractable, without status migrainosus: Secondary | ICD-10-CM | POA: Diagnosis not present

## 2015-04-26 DIAGNOSIS — F329 Major depressive disorder, single episode, unspecified: Secondary | ICD-10-CM

## 2015-04-26 LAB — MAGNESIUM: Magnesium: 1.7 mg/dL (ref 1.5–2.5)

## 2015-04-26 LAB — CBC WITH DIFFERENTIAL/PLATELET
BASOS PCT: 1 % (ref 0–1)
Basophils Absolute: 0.1 10*3/uL (ref 0.0–0.1)
Eosinophils Absolute: 0.1 10*3/uL (ref 0.0–0.7)
Eosinophils Relative: 2 % (ref 0–5)
HCT: 36.9 % (ref 36.0–46.0)
HEMOGLOBIN: 12.3 g/dL (ref 12.0–15.0)
LYMPHS ABS: 1.8 10*3/uL (ref 0.7–4.0)
Lymphocytes Relative: 30 % (ref 12–46)
MCH: 31.1 pg (ref 26.0–34.0)
MCHC: 33.3 g/dL (ref 30.0–36.0)
MCV: 93.2 fL (ref 78.0–100.0)
MPV: 8.5 fL — ABNORMAL LOW (ref 8.6–12.4)
Monocytes Absolute: 0.6 10*3/uL (ref 0.1–1.0)
Monocytes Relative: 10 % (ref 3–12)
NEUTROS PCT: 57 % (ref 43–77)
Neutro Abs: 3.4 10*3/uL (ref 1.7–7.7)
PLATELETS: 245 10*3/uL (ref 150–400)
RBC: 3.96 MIL/uL (ref 3.87–5.11)
RDW: 15.3 % (ref 11.5–15.5)
WBC: 5.9 10*3/uL (ref 4.0–10.5)

## 2015-04-26 LAB — VITAMIN B12: VITAMIN B 12: 451 pg/mL (ref 211–911)

## 2015-04-26 LAB — HEMOGLOBIN A1C
HEMOGLOBIN A1C: 5.8 % — AB (ref <5.7)
MEAN PLASMA GLUCOSE: 120 mg/dL — AB (ref <117)

## 2015-04-26 LAB — BASIC METABOLIC PANEL WITH GFR
BUN: 10 mg/dL (ref 7–25)
CHLORIDE: 103 mmol/L (ref 98–110)
CO2: 29 mmol/L (ref 20–31)
Calcium: 9.3 mg/dL (ref 8.6–10.4)
Creat: 0.7 mg/dL (ref 0.60–0.93)
GFR, Est African American: >89 mL/min (ref >=60)
GFR, Est Non African American: 84 mL/min (ref >=60)
Glucose, Bld: 85 mg/dL (ref 65–99)
POTASSIUM: 3.5 mmol/L (ref 3.5–5.3)
SODIUM: 141 mmol/L (ref 135–146)

## 2015-04-26 LAB — LIPID PANEL
Cholesterol: 170 mg/dL (ref 125–200)
HDL: 39 mg/dL — AB (ref >=46)
LDL Cholesterol: 99 mg/dL (ref <130)
Total CHOL/HDL Ratio: 4.4 Ratio (ref <=5.0)
Triglycerides: 162 mg/dL — ABNORMAL HIGH (ref <150)
VLDL: 32 mg/dL — AB (ref <30)

## 2015-04-26 LAB — HEPATIC FUNCTION PANEL
ALK PHOS: 68 U/L (ref 33–130)
ALT: 9 U/L (ref 6–29)
AST: 14 U/L (ref 10–35)
Albumin: 4.3 g/dL (ref 3.6–5.1)
BILIRUBIN DIRECT: 0.1 mg/dL (ref <=0.2)
BILIRUBIN INDIRECT: 0.4 mg/dL (ref 0.2–1.2)
BILIRUBIN TOTAL: 0.5 mg/dL (ref 0.2–1.2)
Total Protein: 6.6 g/dL (ref 6.1–8.1)

## 2015-04-26 LAB — TSH: TSH: 24.466 u[IU]/mL — ABNORMAL HIGH (ref 0.350–4.500)

## 2015-04-26 MED ORDER — TRIAMCINOLONE ACETONIDE 0.1 % EX CREA: 1.0000 "application " | TOPICAL_CREAM | Freq: Two times a day (BID) | CUTANEOUS | Status: DC

## 2015-04-26 NOTE — Patient Instructions (Addendum)
I think it is possible that you have sleep apnea. It can cause interrupted sleep, headaches, frequent awakenings, fatigue, dry mouth, fast/slow heart beats, memory issues, anxiety/depression, swelling, numbness tingling hands/feet, weight gain, shortness of breath, and the list goes on. Sleep apnea needs to be ruled out because if it is left untreated it does eventually lead to abnormal heart beats, lung failure or heart failure as well as increasing the risk of heart attack and stroke. There are masks you can wear OR a mouth piece that I can give you information about. Often times though people feel MUCH better after getting treatment.   Sleep Apnea  Sleep apnea is a sleep disorder characterized by abnormal pauses in breathing while you sleep. When your breathing pauses, the level of oxygen in your blood decreases. This causes you to move out of deep sleep and into light sleep. As a result, your quality of sleep is poor, and the system that carries your blood throughout your body (cardiovascular system) experiences stress. If sleep apnea remains untreated, the following conditions can develop:  High blood pressure (hypertension).  Coronary artery disease.  Inability to achieve or maintain an erection (impotence).  Impairment of your thought process (cognitive dysfunction). There are three types of sleep apnea: 1. Obstructive sleep apnea--Pauses in breathing during sleep because of a blocked airway. 2. Central sleep apnea--Pauses in breathing during sleep because the area of the brain that controls your breathing does not send the correct signals to the muscles that control breathing. 3. Mixed sleep apnea--A combination of both obstructive and central sleep apnea.  RISK FACTORS The following risk factors can increase your risk of developing sleep apnea:  Being overweight.  Smoking.  Having narrow passages in your nose and throat.  Being of older age.  Being female.  Alcohol use.   Sedative and tranquilizer use.  Ethnicity. Among individuals younger than 35 years, African Americans are at increased risk of sleep apnea. SYMPTOMS   Difficulty staying asleep.  Daytime sleepiness and fatigue.  Loss of energy.  Irritability.  Loud, heavy snoring.  Morning headaches.  Trouble concentrating.  Forgetfulness.  Decreased interest in sex. DIAGNOSIS  In order to diagnose sleep apnea, your caregiver will perform a physical examination. Your caregiver may suggest that you take a home sleep test. Your caregiver may also recommend that you spend the night in a sleep lab. In the sleep lab, several monitors record information about your heart, lungs, and brain while you sleep. Your leg and arm movements and blood oxygen level are also recorded. TREATMENT The following actions may help to resolve mild sleep apnea:  Sleeping on your side.   Using a decongestant if you have nasal congestion.   Avoiding the use of depressants, including alcohol, sedatives, and narcotics.   Losing weight and modifying your diet if you are overweight. There also are devices and treatments to help open your airway:  Oral appliances. These are custom-made mouthpieces that shift your lower jaw forward and slightly open your bite. This opens your airway.  Devices that create positive airway pressure. This positive pressure "splints" your airway open to help you breathe better during sleep. The following devices create positive airway pressure:  Continuous positive airway pressure (CPAP) device. The CPAP device creates a continuous level of air pressure with an air pump. The air is delivered to your airway through a mask while you sleep. This continuous pressure keeps your airway open.  Nasal expiratory positive airway pressure (EPAP) device. The EPAP device  creates positive air pressure as you exhale. The device consists of single-use valves, which are inserted into each nostril and held in  place by adhesive. The valves create very little resistance when you inhale but create much more resistance when you exhale. That increased resistance creates the positive airway pressure. This positive pressure while you exhale keeps your airway open, making it easier to breath when you inhale again.  Bilevel positive airway pressure (BPAP) device. The BPAP device is used mainly in patients with central sleep apnea. This device is similar to the CPAP device because it also uses an air pump to deliver continuous air pressure through a mask. However, with the BPAP machine, the pressure is set at two different levels. The pressure when you exhale is lower than the pressure when you inhale.  Surgery. Typically, surgery is only done if you cannot comply with less invasive treatments or if the less invasive treatments do not improve your condition. Surgery involves removing excess tissue in your airway to create a wider passage way. Document Released: 07/13/2002 Document Revised: 11/17/2012 Document Reviewed: 11/29/2011 [facility] Patient Information 2015 [place], [place]. This information is not intended to replace advice given to you by your health care provider. Make sure you discuss any questions you have with your health care provider.  Fall Prevention and Home Safety Falls cause injuries and can affect all age groups. It is possible to use preventive measures to significantly decrease the likelihood of falls. There are many simple measures which can make your home safer and prevent falls. OUTDOORS  Repair cracks and edges of walkways and driveways.  Remove high doorway thresholds.  Trim shrubbery on the main path into your home.  Have good outside lighting.  Clear walkways of tools, rocks, debris, and clutter.  Check that handrails are not broken and are securely fastened. Both sides of steps should have handrails.  Have leaves, snow, and ice cleared regularly.  Use sand or salt on walkways  during winter months.  In the garage, clean up grease or oil spills. BATHROOM  Install night lights.  Install grab bars by the toilet and in the tub and shower.  Use non-skid mats or decals in the tub or shower.  Place a plastic non-slip stool in the shower to sit on, if needed.  Keep floors dry and clean up all water on the floor immediately.  Remove soap buildup in the tub or shower on a regular basis.  Secure bath mats with non-slip, double-sided rug tape.  Remove throw rugs and tripping hazards from the floors. BEDROOMS  Install night lights.  Make sure a bedside light is easy to reach.  Do not use oversized bedding.  Keep a telephone by your bedside.  Have a firm chair with side arms to use for getting dressed.  Remove throw rugs and tripping hazards from the floor. KITCHEN  Keep handles on pots and pans turned toward the center of the stove. Use back burners when possible.  Clean up spills quickly and allow time for drying.  Avoid walking on wet floors.  Avoid hot utensils and knives.  Position shelves so they are not too high or low.  Place commonly used objects within easy reach.  If necessary, use a sturdy step stool with a grab bar when reaching.  Keep electrical cables out of the way.  Do not use floor polish or wax that makes floors slippery. If you must use wax, use non-skid floor wax.  Remove throw rugs and tripping  hazards from the floor. STAIRWAYS  Never leave objects on stairs.  Place handrails on both sides of stairways and use them. Fix any loose handrails. Make sure handrails on both sides of the stairways are as long as the stairs.  Check carpeting to make sure it is firmly attached along stairs. Make repairs to worn or loose carpet promptly.  Avoid placing throw rugs at the top or bottom of stairways, or properly secure the rug with carpet tape to prevent slippage. Get rid of throw rugs, if possible.  Have an electrician put in a  light switch at the top and bottom of the stairs. OTHER FALL PREVENTION TIPS  Wear low-heel or rubber-soled shoes that are supportive and fit well. Wear closed toe shoes.  When using a stepladder, make sure it is fully opened and both spreaders are firmly locked. Do not climb a closed stepladder.  Add color or contrast paint or tape to grab bars and handrails in your home. Place contrasting color strips on first and last steps.  Learn and use mobility aids as needed. Install an electrical emergency response system.  Turn on lights to avoid dark areas. Replace light bulbs that burn out immediately. Get light switches that glow.  Arrange furniture to create clear pathways. Keep furniture in the same place.  Firmly attach carpet with non-skid or double-sided tape.  Eliminate uneven floor surfaces.  Select a carpet pattern that does not visually hide the edge of steps.  Be aware of all pets. OTHER HOME SAFETY TIPS  Set the water temperature for 120 F (48.8 C).  Keep emergency numbers on or near the telephone.  Keep smoke detectors on every level of the home and near sleeping areas. Document Released: 07/13/2002 Document Revised: 01/22/2012 Document Reviewed: 10/12/2011 [facility] Patient Information 2015 [place], [place]. This information is not intended to replace advice given to you by your health care provider. Make sure you discuss any questions you have with your health care provider.     Preventive Care for Adults A healthy lifestyle and preventive care can promote health and wellness. Preventive health guidelines for women include the following key practices.  A routine yearly physical is a good way to check with your health care provider about your health and preventive screening. It is a chance to share any concerns and updates on your health and to receive a thorough exam.  Visit your dentist for a routine exam and preventive care every 6 months. Brush your teeth twice a  day and floss once a day. Good oral hygiene prevents tooth decay and gum disease.  The frequency of eye exams is based on your age, health, family medical history, use of contact lenses, and other factors. Follow your health care provider's recommendations for frequency of eye exams.  Eat a healthy diet. Foods like vegetables, fruits, whole grains, low-fat dairy products, and lean protein foods contain the nutrients you need without too many calories. Decrease your intake of foods high in solid fats, added sugars, and salt. Eat the right amount of calories for you.Get information about a proper diet from your health care provider, if necessary.  Regular physical exercise is one of the most important things you can do for your health. Most adults should get at least 150 minutes of moderate-intensity exercise (any activity that increases your heart rate and causes you to sweat) each week. In addition, most adults need muscle-strengthening exercises on 2 or more days a week.  Maintain a healthy weight. The body  mass index (BMI) is a screening tool to identify possible weight problems. It provides an estimate of body fat based on height and weight. Your health care provider can find your BMI and can help you achieve or maintain a healthy weight.For adults 20 years and older:  A BMI below 18.5 is considered underweight.  A BMI of 18.5 to 24.9 is normal.  A BMI of 25 to 29.9 is considered overweight.  A BMI of 30 and above is considered obese.  Maintain normal blood lipids and cholesterol levels by exercising and minimizing your intake of saturated fat. Eat a balanced diet with plenty of fruit and vegetables. If your lipid or cholesterol levels are high, you are over 50, or you are at high risk for heart disease, you may need your cholesterol levels checked more frequently.Ongoing high lipid and cholesterol levels should be treated with medicines if diet and exercise are not working.  If you smoke,  find out from your health care provider how to quit. If you do not use tobacco, do not start.  Lung cancer screening is recommended for adults aged 39-80 years who are at high risk for developing lung cancer because of a history of smoking. A yearly low-dose CT scan of the lungs is recommended for people who have at least a 30-pack-year history of smoking and are a current smoker or have quit within the past 15 years. A pack year of smoking is smoking an average of 1 pack of cigarettes a day for 1 year (for example: 1 pack a day for 30 years or 2 packs a day for 15 years). Yearly screening should continue until the smoker has stopped smoking for at least 15 years. Yearly screening should be stopped for people who develop a health problem that would prevent them from having lung cancer treatment.  Avoid use of street drugs. Do not share needles with anyone. Ask for help if you need support or instructions about stopping the use of drugs.  High blood pressure causes heart disease and increases the risk of stroke.  Ongoing high blood pressure should be treated with medicines if weight loss and exercise do not work.  If you are 22-74 years old, ask your health care provider if you should take aspirin to prevent strokes.  Diabetes screening involves taking a blood sample to check your fasting blood sugar level. This should be done once every 3 years, after age 43, if you are within normal weight and without risk factors for diabetes. Testing should be considered at a younger age or be carried out more frequently if you are overweight and have at least 1 risk factor for diabetes.  Breast cancer screening is essential preventive care for women. You should practice "breast self-awareness." This means understanding the normal appearance and feel of your breasts and may include breast self-examination. Any changes detected, no matter how small, should be reported to a health care provider. Women in their 32s and  30s should have a clinical breast exam (CBE) by a health care provider as part of a regular health exam every 1 to 3 years. After age 80, women should have a CBE every year. Starting at age 5, women should consider having a mammogram (breast X-ray test) every year. Women who have a family history of breast cancer should talk to their health care provider about genetic screening. Women at a high risk of breast cancer should talk to their health care providers about having an MRI and a mammogram  every year.  Breast cancer gene (BRCA)-related cancer risk assessment is recommended for women who have family members with BRCA-related cancers. BRCA-related cancers include breast, ovarian, tubal, and peritoneal cancers. Having family members with these cancers may be associated with an increased risk for harmful changes (mutations) in the breast cancer genes BRCA1 and BRCA2. Results of the assessment will determine the need for genetic counseling and BRCA1 and BRCA2 testing.  Routine pelvic exams to screen for cancer are no longer recommended for nonpregnant women who are considered low risk for cancer of the pelvic organs (ovaries, uterus, and vagina) and who do not have symptoms. Ask your health care provider if a screening pelvic exam is right for you.  If you have had past treatment for cervical cancer or a condition that could lead to cancer, you need Pap tests and screening for cancer for at least 20 years after your treatment. If Pap tests have been discontinued, your risk factors (such as having a new sexual partner) need to be reassessed to determine if screening should be resumed. Some women have medical problems that increase the chance of getting cervical cancer. In these cases, your health care provider may recommend more frequent screening and Pap tests.    Colorectal cancer can be detected and often prevented. Most routine colorectal cancer screening begins at the age of 23 years and continues  through age 7 years. However, your health care provider may recommend screening at an earlier age if you have risk factors for colon cancer. On a yearly basis, your health care provider may provide home test kits to check for hidden blood in the stool. Use of a small camera at the end of a tube, to directly examine the colon (sigmoidoscopy or colonoscopy), can detect the earliest forms of colorectal cancer. Talk to your health care provider about this at age 28, when routine screening begins. Direct exam of the colon should be repeated every 5-10 years through age 63 years, unless early forms of pre-cancerous polyps or small growths are found.  Osteoporosis is a disease in which the bones lose minerals and strength with aging. This can result in serious bone fractures or breaks. The risk of osteoporosis can be identified using a bone density scan. Women ages 65 years and over and women at risk for fractures or osteoporosis should discuss screening with their health care providers. Ask your health care provider whether you should take a calcium supplement or vitamin D to reduce the rate of osteoporosis.  Menopause can be associated with physical symptoms and risks. Hormone replacement therapy is available to decrease symptoms and risks. You should talk to your health care provider about whether hormone replacement therapy is right for you.  Use sunscreen. Apply sunscreen liberally and repeatedly throughout the day. You should seek shade when your shadow is shorter than you. Protect yourself by wearing long sleeves, pants, a wide-brimmed hat, and sunglasses year round, whenever you are outdoors.  Once a month, do a whole body skin exam, using a mirror to look at the skin on your back. Tell your health care provider of new moles, moles that have irregular borders, moles that are larger than a pencil eraser, or moles that have changed in shape or color.  Stay current with required vaccines  (immunizations).  Influenza vaccine. All adults should be immunized every year.  Tetanus, diphtheria, and acellular pertussis (Td, Tdap) vaccine. Pregnant women should receive 1 dose of Tdap vaccine during each pregnancy. The dose should be obtained  regardless of the length of time since the last dose. Immunization is preferred during the 27th-36th week of gestation. An adult who has not previously received Tdap or who does not know her vaccine status should receive 1 dose of Tdap. This initial dose should be followed by tetanus and diphtheria toxoids (Td) booster doses every 10 years. Adults with an unknown or incomplete history of completing a 3-dose immunization series with Td-containing vaccines should begin or complete a primary immunization series including a Tdap dose. Adults should receive a Td booster every 10 years.    Zoster vaccine. One dose is recommended for adults aged 36 years or older unless certain conditions are present.    Pneumococcal 13-valent conjugate (PCV13) vaccine. When indicated, a person who is uncertain of her immunization history and has no record of immunization should receive the PCV13 vaccine. An adult aged 1 years or older who has certain medical conditions and has not been previously immunized should receive 1 dose of PCV13 vaccine. This PCV13 should be followed with a dose of pneumococcal polysaccharide (PPSV23) vaccine. The PPSV23 vaccine dose should be obtained at least 8 weeks after the dose of PCV13 vaccine. An adult aged 58 years or older who has certain medical conditions and previously received 1 or more doses of PPSV23 vaccine should receive 1 dose of PCV13. The PCV13 vaccine dose should be obtained 1 or more years after the last PPSV23 vaccine dose.    Pneumococcal polysaccharide (PPSV23) vaccine. When PCV13 is also indicated, PCV13 should be obtained first. All adults aged 58 years and older should be immunized. An adult younger than age 3 years who  has certain medical conditions should be immunized. Any person who resides in a nursing home or long-term care facility should be immunized. An adult smoker should be immunized. People with an immunocompromised condition and certain other conditions should receive both PCV13 and PPSV23 vaccines. People with human immunodeficiency virus (HIV) infection should be immunized as soon as possible after diagnosis. Immunization during chemotherapy or radiation therapy should be avoided. Routine use of PPSV23 vaccine is not recommended for American Indians, [place] Natives, or people younger than 65 years unless there are medical conditions that require PPSV23 vaccine. When indicated, people who have unknown immunization and have no record of immunization should receive PPSV23 vaccine. One-time revaccination 5 years after the first dose of PPSV23 is recommended for people aged 19-64 years who have chronic kidney failure, nephrotic syndrome, asplenia, or immunocompromised conditions. People who received 1-2 doses of PPSV23 before age 25 years should receive another dose of PPSV23 vaccine at age 36 years or later if at least 5 years have passed since the previous dose. Doses of PPSV23 are not needed for people immunized with PPSV23 at or after age 3 years.   Preventive Services / Frequency  Ages 65 years and over  Blood pressure check.  Lipid and cholesterol check.  Lung cancer screening. / Every year if you are aged 64-80 years and have a 30-pack-year history of smoking and currently smoke or have quit within the past 15 years. Yearly screening is stopped once you have quit smoking for at least 15 years or develop a health problem that would prevent you from having lung cancer treatment.  Clinical breast exam.** / Every year after age 93 years.  BRCA-related cancer risk assessment.** / For women who have family members with a BRCA-related cancer (breast, ovarian, tubal, or peritoneal cancers).  Mammogram.**  / Every year beginning at age 42 years  and continuing for as long as you are in good health. Consult with your health care provider.  Pap test.** / Every 3 years starting at age 37 years through age 40 or 91 years with 3 consecutive normal Pap tests. Testing can be stopped between 65 and 70 years with 3 consecutive normal Pap tests and no abnormal Pap or HPV tests in the past 10 years.  Fecal occult blood test (FOBT) of stool. / Every year beginning at age 75 years and continuing until age 35 years. You may not need to do this test if you get a colonoscopy every 10 years.  Flexible sigmoidoscopy or colonoscopy.** / Every 5 years for a flexible sigmoidoscopy or every 10 years for a colonoscopy beginning at age 32 years and continuing until age 5 years.  Hepatitis C blood test.** / For all people born from [birth year] through 1963 and any individual with known risks for hepatitis C.  Osteoporosis screening.** / A one-time screening for women ages 60 years and over and women at risk for fractures or osteoporosis.  Skin self-exam. / Monthly.  Influenza vaccine. / Every year.  Tetanus, diphtheria, and acellular pertussis (Tdap/Td) vaccine.** / 1 dose of Td every 10 years.  Zoster vaccine.** / 1 dose for adults aged 32 years or older.  Pneumococcal 13-valent conjugate (PCV13) vaccine.** / Consult your health care provider.  Pneumococcal polysaccharide (PPSV23) vaccine.** / 1 dose for all adults aged 42 years and older. Screening for abdominal aortic aneurysm (AAA)  by ultrasound is recommended for people who have history of high blood pressure or who are current or former smokers.

## 2015-04-26 NOTE — Progress Notes (Signed)
MEDICARE ANNUAL WELLNESS VISIT AND CPE  Assessment:   1. Essential hypertension - continue medications, better in office after medications, DASH diet, exercise and monitor at home. Call if greater than 130/80.  - CBC with Differential/Platelet - BASIC METABOLIC PANEL WITH GFR - Hepatic function panel - Urinalysis, Routine w reflex microscopic (not at [facility]) - Microalbumin / creatinine urine ratio - EKG 12-Lead  2. Type 2 diabetes mellitus with other diabetic kidney complication Discussed general issues about diabetes pathophysiology and management., Educational material distributed., Suggested low cholesterol diet., Encouraged aerobic exercise., Discussed foot care., Reminded to get yearly retinal exam. - Hemoglobin A1c - HM DIABETES FOOT EXAM  3. Hypothyroidism, unspecified hypothyroidism type Hypothyroidism-check TSH level, continue medications the same, reminded to take on an empty stomach 30-28mins before food.  - TSH  4. Mixed hyperlipidemia -continue medications, check lipids, decrease fatty foods, increase activity.  - Lipid panel  5. Vitamin D deficiency - Vit D  25 hydroxy (rtn osteoporosis monitoring)  6. Encounter for long-term (current) use of medications - Magnesium  7. Coronary artery disease involving native coronary artery of native heart without angina pectoris Control blood pressure, cholesterol, glucose, increase exercise.   8. Carotid artery disease, unspecified laterality Control blood pressure, cholesterol, glucose, increase exercise.   9. Obstructive apnea Suggest getting back on CPAP with symptoms, she will look for her CPAP at home.   10. Ocular migraine remission  11. Gastroesophageal reflux disease, esophagitis presence not specified Continue PPI/H2 blocker, diet discussed  12. Arthritis RICE, NSAIDS, exercises given  13. Depression, controlled  continue medications, stress management techniques discussed, increase water, good sleep  hygiene discussed, increase exercise, and increase veggies.   14. Anxiety and depression  stress management techniques discussed, increase water, good sleep hygiene discussed, increase exercise, and increase veggies.   15. Obesity Obesity with co morbidities- long discussion about weight loss, diet, and exercise  16. Encounter for Medicare annual wellness exam Colonoscopy- patient declines a colonoscopy even though the risks and benefits were discussed at length. Colon cancer is 3rd most diagnosed cancer and 2nd leading cause of death in both men and women 31 years of age and older. Patient understands the risk of cancer and death with declining the test however they are willing to do cologuard screening instead. They understand that this is not as sensitive or specific as a colonoscopy and they are still recommended to get a colonoscopy. The cologuard will be sent out to their house.   17. At high risk for falls + imbalance with two falls, has had right knee/hip pain, evaluate and treat.  - Ambulatory referral to Physical Therapy  18. Anemia, unspecified anemia type - Vitamin B12  19. Imbalance + imbalance with two falls, has had right knee/hip pain, evaluate and treat.  - Ambulatory referral to Physical Therapy  20. Right knee pain - Ambulatory referral to Physical Therapy  21. Need for prophylactic vaccination and inoculation against influenza - Flu vaccine HIGH DOSE PF  Over 40 minutes of exam, counseling, chart review and critical decision making was performed  Plan:   During the course of the visit the patient was educated and counseled about appropriate screening and preventive services including:    Pneumococcal vaccine   Prevnar 13  Influenza vaccine  Td vaccine  Screening electrocardiogram  Bone densitometry screening  Colorectal cancer screening  Diabetes screening  Glaucoma screening  Nutrition counseling   Advanced directives:  requested  Conditions/risks identified: BMI: Discussed weight loss, diet, and increase  physical activity.  Increase physical activity: AHA recommends 150 minutes of physical activity a week.  Medications reviewed Diabetes is at goal, ACE/ARB therapy: No, Reason not on Ace Inhibitor/ARB therapy:  PreDM Urinary Incontinence is not an issue: discussed non pharmacology and pharmacology options.  Fall risk: high- discussed PT, home fall assessment, medications.    Subjective:  [name] is a 79 y.o. female who presents for Medicare Annual Wellness Visit and complete physical.  Date of last medicare wellness visit was 02/2014   Her blood pressure has not been controlled at home, runs 130-140/70 but she did not take her medications this AM, today their BP is BP: (!) 200/92 mmHg She does not workout. She denies chest pain, shortness of breath, dizziness.  She states she has fallen out of bed once and tripped over something recently, hurt her right knee, no LOC.  She is on cholesterol medication and denies myalgias. Her cholesterol is at goal. The cholesterol last visit was:   Lab Results  Component Value Date   CHOL 188 01/06/2015   HDL 45* 01/06/2015   LDLCALC 84 01/06/2015   TRIG 293* 01/06/2015   CHOLHDL 4.2 01/06/2015    She has been working on diet and exercise for prediabetes, and denies paresthesia of the feet, polydipsia, polyuria and visual disturbances. Last A1C in the office was:  Lab Results  Component Value Date   HGBA1C 5.9* 01/06/2015   Patient is on Vitamin D supplement.   Lab Results  Component Value Date   VD25OH 31 10/05/2014   She has depression, follows with a counselor, she is on medications with Dr. Sandria Manly, she is on lamictal, lexapro and wellbutrin.  She has redness around her right eye x 3 days with itching without pain, blurred vision, sees her eye doctor tomorrow.  She is on thyroid medication. Her medication was not changed last visit.   Lab Results   Component Value Date   TSH 3.202 01/06/2015  .    BMI is Body mass index is 31.7 kg/(m^2)., she is working on diet and exercise. She has OSA but is not on CPAP for last 3 years due to losing her CPAP.  She was approved for nuvigil but is not on it due to cost, she is on provigil.   Wt Readings from Last 3 Encounters:  04/26/15 181 lb 12.8 oz (82.464 kg)  01/14/15 180 lb (81.647 kg)  01/06/15 180 lb (81.647 kg)    Medication Review: Current Outpatient Prescriptions on File Prior to Visit  Medication Sig Dispense Refill  . ALPRAZolam (XANAX) 0.25 MG tablet Take 0.25 mg by mouth daily as needed. Anxiety As needed    . buPROPion (WELLBUTRIN) 100 MG tablet Take 100 mg by mouth 2 (two) times daily.    . Cetirizine HCl (ZYRTEC ALLERGY) 10 MG CAPS Take 1 capsule (10 mg total) by mouth at bedtime. 30 capsule 2  . Cholecalciferol (VITAMIN D PO) Take 1 capsule by mouth daily.     Marland Kitchen escitalopram (LEXAPRO) 10 MG tablet Take 10 mg by mouth 2 (two) times daily.     . hydrochlorothiazide (HYDRODIURIL) 25 MG tablet Take 1 tablet daily for BP & Fluid 90 tablet 1  . HYDROcodone-acetaminophen (NORCO) 10-325 MG per tablet Take 1 tablet by mouth every 6 (six) hours as needed.    . lamoTRIgine (LAMICTAL) 25 MG tablet Take 25 mg by mouth. Takes 2 tabs at bedtime.    Marland Kitchen levothyroxine (SYNTHROID, LEVOTHROID) 175 MCG tablet Take 1  tablet (175 mcg total) by mouth daily. 90 tablet 1  . lisinopril (PRINIVIL,ZESTRIL) 40 MG tablet Take 1 tablet daily for BP & Kidneys 90 tablet 1  . MAGNESIUM PO Take 1 tablet by mouth daily.    . modafinil (PROVIGIL) 200 MG tablet TAKE ONE TABLET BY MOUTH TWICE DAILY 180 tablet 0  . montelukast (SINGULAIR) 10 MG tablet Take 1 tablet (10 mg total) by mouth daily. 30 tablet 2  . omeprazole (PRILOSEC) 40 MG capsule TAKE ONE CAPSULE BY MOUTH ONCE DAILY FOR  HEARTBURN 30 capsule 3  . rosuvastatin (CRESTOR) 20 MG tablet Take 1 tablet (20 mg total) by mouth daily. 90 tablet 3  . traZODone  (DESYREL) 50 MG tablet Take 1 tablet (50 mg total) by mouth at bedtime. 30 tablet 3   No current facility-administered medications on file prior to visit.    Current Problems (verified) Patient Active Problem List   Diagnosis Date Noted  . Obesity 04/26/2015  . Encounter for Medicare annual wellness exam 04/26/2015  . Mixed hyperlipidemia 10/05/2014  . Anxiety and depression 05/06/2014  . Carotid arterial disease 05/06/2014  . Hypertension 05/06/2014  . Obstructive apnea 05/06/2014  . T2_NIDDM w/ Stage 2 CKD (GFR 78 ml/min) 09/07/2013  . Vitamin D deficiency 09/07/2013  . Encounter for long-term (current) use of medications 09/07/2013  . Ocular migraine 04/22/2013  . Depression, controlled   . Arthritis   . GERD (gastroesophageal reflux disease)   . Hypothyroidism   . CAD (coronary artery disease) 08/21/2011    Screening Tests Immunization History  Administered Date(s) Administered  . Pneumococcal Conjugate-13 11/04/2013  . Pneumococcal Polysaccharide-23 08/06/1998  . Zoster 12/19/2014    Preventative care: Last colonoscopy: 2006, polyps, refuses colonoscopy will do cologuard.  Last 3D mammogram: 11/ 2015 [facility], CAT B Last pap smear/pelvic exam: 2001 neg, declines another DEXA: 06/2014 osteopenia  Prior vaccinations: TD or Tdap: ALLERGY  Influenza: 2014 DUE TODAY Pneumococcal: 2000 Prevnar13: 2015 Shingles/Zostavax: 2016  Names of Other Physician/Practitioners you currently use: 1. [facility] here for primary care 2. Dr. Sullivan Lone, eye doctor, last visit 04/2014, has appointment tomorrow 3. Dr. Lawerance Bach, dentist, last visit 3 months ago.  Patient Care Team: Lucky Cowboy, MD as PCP - General (Internal Medicine) Francena Hanly, MD as Consulting Physician (Orthopedic Surgery) Pricilla Riffle, MD as Consulting Physician (Cardiology) Drucilla Schmidt, MD as Consulting Physician (Orthopedic Surgery) Leta Speller, MD as Consulting  Physician (Dermatology) Sheran Luz, MD as Consulting Physician (Physical Medicine and Rehabilitation) Carman Ching, MD as Consulting Physician (Gastroenterology)  SURGICAL HISTORY Past Surgical History  Procedure Laterality Date  . Breast tumor      secondary to fibrocystic disese  . Cholecystctomy    . Left hip replacement    . Abdominal hysterectomy    . Cardiac catheterization      normal dr. Fredrich Birks  . Joint replacement      left hip  . Cholecystectomy    . Total shoulder arthroplasty  11/15/2011    Procedure: TOTAL SHOULDER ARTHROPLASTY;  Surgeon: Senaida Lange, MD;  Location: [facility] OR;  Service: Orthopedics;  Laterality: Left;  left total shoulder arthroplasty   FAMILY HISTORY Family History  Problem Relation Age of Onset  . Anesthesia problems Neg Hx   . Hypotension Neg Hx   . Malignant hyperthermia Neg Hx   . Pseudochol deficiency Neg Hx   . Cancer Mother   . Diabetes Mother   . Heart attack Mother   .  Heart disease Father   . COPD Son    SOCIAL HISTORY Social History  Substance Use Topics  . Smoking status: Former Smoker    Quit date: 08/06/1990  . Smokeless tobacco: Never Used  . Alcohol Use: No    MEDICARE WELLNESS OBJECTIVES: Tobacco use: She does not smoke.  Patient is a former smoker. If yes, counseling given Alcohol Current alcohol use: none Osteoporosis: postmenopausal estrogen deficiency and dietary calcium and/or vitamin D deficiency, History of fracture in the past year: no Fall risk: High Risk Hearing: impaired Visual acuity: normal,  does not perform annual eye exam Diet: in general, a "healthy" diet   Physical activity: Current Exercise Habits:: The patient does not participate in regular exercise at present Cardiac risk factors: Cardiac Risk Factors include: advanced age (>55men, >64 women);dyslipidemia;obesity (BMI >30kg/m2);sedentary lifestyle;hypertension Depression/mood screen:   Depression screen [facility] 2/9 04/26/2015  Decreased Interest  0  Down, Depressed, Hopeless 0  PHQ - 2 Score 0    ADLs:  In your present state of health, do you have any difficulty performing the following activities: 04/26/2015  Hearing? N  Vision? N  Difficulty concentrating or making decisions? Y  Walking or climbing stairs? Y  Dressing or bathing? N  Doing errands, shopping? N  Preparing Food and eating ? N  Using the Toilet? N  In the past six months, have you accidently leaked urine? N  Do you have problems with loss of bowel control? N  Managing your Medications? N  Managing your Finances? N  Housekeeping or managing your Housekeeping? N     Cognitive Testing  Alert? Yes  Normal Appearance?Yes  Oriented to person? Yes  Place? Yes   Time? Yes  Recall of three objects?  Yes  Can perform simple calculations? Yes  Displays appropriate judgment?Yes  Can read the correct time from a watch face?Yes  EOL planning: Does patient have an advance directive?: No Would patient like information on creating an advanced directive?: Yes - Educational materials given  Review of Systems  Constitutional: Positive for malaise/fatigue. Negative for fever, chills, weight loss and diaphoresis.  HENT: Negative for congestion, ear discharge, ear pain, hearing loss, nosebleeds, sore throat and tinnitus.   Eyes: Negative.   Respiratory: Negative.  Negative for cough, shortness of breath, wheezing and stridor.   Cardiovascular: Negative for chest pain, palpitations and leg swelling.  Gastrointestinal: Positive for diarrhea and constipation. Negative for heartburn, nausea, vomiting, abdominal pain, blood in stool and melena.  Genitourinary: Negative.  Negative for dysuria, urgency and frequency.  Musculoskeletal: Positive for joint pain and falls. Negative for myalgias, back pain and neck pain.  Skin: Negative.   Neurological: Negative.  Negative for dizziness, sensory change and headaches.  Psychiatric/Behavioral: Positive for depression. The patient has  insomnia. The patient is not nervous/anxious.      Objective:     Today's Vitals   04/26/15 1010  BP: 200/92  Pulse: 60  Temp: 98.5 F (36.9 C)  Resp: 18  Height: 5' 3.5" (1.613 m)  Weight: 181 lb 12.8 oz (82.464 kg)   Body mass index is 31.7 kg/(m^2).  General appearance: alert, no distress, WD/WN, female HEENT: normocephalic, sclerae anicteric, erythematous rash around right eye without tenderness, TMs pearly, nares patent, no discharge or erythema, pharynx normal Oral cavity: MMM, no lesions Neck: supple, no lymphadenopathy, no thyromegaly, no masses Heart: RRR, normal S1, S2, no murmurs Lungs: CTA bilaterally, no wheezes, rhonchi, or rales Abdomen: +bs, soft, obese, non tender, non distended, no  masses, no hepatomegaly, no splenomegaly Musculoskeletal: + tenderness right knee/hip, no swelling, no obvious deformity Extremities: no edema, no cyanosis, no clubbing  Pulses: 2+ symmetric, upper and lower extremities, normal cap refill Neurological: alert, oriented x 3, CN2-12 intact, strength normal upper extremities and lower extremities, sensation normal throughout, DTRs 2+ throughout, no cerebellar signs, gait unsteady Psychiatric: normal affect, behavior normal, pleasant   Medicare Attestation I have personally reviewed: The patient's medical and social history Their use of alcohol, tobacco or illicit drugs Their current medications and supplements The patient's functional ability including ADLs,fall risks, home safety risks, cognitive, and hearing and visual impairment Diet and physical activities Evidence for depression or mood disorders  The patient's weight, height, BMI, and visual acuity have been recorded in the chart.  I have made referrals, counseling, and provided education to the patient based on review of the above and I have provided the patient with a written personalized care plan for preventive services.     Quentin Mulling, PA-C   04/26/2015

## 2015-04-27 ENCOUNTER — Encounter (HOSPITAL_COMMUNITY): Payer: Self-pay | Admitting: Family Medicine

## 2015-04-27 ENCOUNTER — Emergency Department (HOSPITAL_COMMUNITY)
Admission: EM | Admit: 2015-04-27 | Discharge: 2015-04-27 | Disposition: A | Discharge status: Home / Self Care | Payer: Medicare Other | Attending: Emergency Medicine | Admitting: Emergency Medicine

## 2015-04-27 ENCOUNTER — Emergency Department (HOSPITAL_COMMUNITY): Payer: Medicare Other

## 2015-04-27 DIAGNOSIS — I10 Essential (primary) hypertension: Secondary | ICD-10-CM | POA: Insufficient documentation

## 2015-04-27 DIAGNOSIS — E876 Hypokalemia: Secondary | ICD-10-CM | POA: Insufficient documentation

## 2015-04-27 DIAGNOSIS — E119 Type 2 diabetes mellitus without complications: Secondary | ICD-10-CM | POA: Insufficient documentation

## 2015-04-27 DIAGNOSIS — R220 Localized swelling, mass and lump, head: Secondary | ICD-10-CM | POA: Diagnosis not present

## 2015-04-27 DIAGNOSIS — R531 Weakness: Secondary | ICD-10-CM | POA: Diagnosis not present

## 2015-04-27 DIAGNOSIS — E785 Hyperlipidemia, unspecified: Secondary | ICD-10-CM | POA: Diagnosis not present

## 2015-04-27 DIAGNOSIS — Y9289 Other specified places as the place of occurrence of the external cause: Secondary | ICD-10-CM | POA: Diagnosis not present

## 2015-04-27 DIAGNOSIS — Z961 Presence of intraocular lens: Secondary | ICD-10-CM | POA: Diagnosis not present

## 2015-04-27 DIAGNOSIS — Y998 Other external cause status: Secondary | ICD-10-CM | POA: Diagnosis not present

## 2015-04-27 DIAGNOSIS — M1990 Unspecified osteoarthritis, unspecified site: Secondary | ICD-10-CM | POA: Diagnosis not present

## 2015-04-27 DIAGNOSIS — S0083XA Contusion of other part of head, initial encounter: Secondary | ICD-10-CM | POA: Diagnosis not present

## 2015-04-27 DIAGNOSIS — E039 Hypothyroidism, unspecified: Secondary | ICD-10-CM | POA: Insufficient documentation

## 2015-04-27 DIAGNOSIS — R011 Cardiac murmur, unspecified: Secondary | ICD-10-CM | POA: Diagnosis not present

## 2015-04-27 DIAGNOSIS — K219 Gastro-esophageal reflux disease without esophagitis: Secondary | ICD-10-CM | POA: Insufficient documentation

## 2015-04-27 DIAGNOSIS — H26491 Other secondary cataract, right eye: Secondary | ICD-10-CM | POA: Diagnosis not present

## 2015-04-27 DIAGNOSIS — Z9889 Other specified postprocedural states: Secondary | ICD-10-CM | POA: Diagnosis not present

## 2015-04-27 DIAGNOSIS — Y9389 Activity, other specified: Secondary | ICD-10-CM | POA: Insufficient documentation

## 2015-04-27 DIAGNOSIS — W1839XA Other fall on same level, initial encounter: Secondary | ICD-10-CM | POA: Insufficient documentation

## 2015-04-27 DIAGNOSIS — Z87891 Personal history of nicotine dependence: Secondary | ICD-10-CM | POA: Diagnosis not present

## 2015-04-27 DIAGNOSIS — E875 Hyperkalemia: Secondary | ICD-10-CM | POA: Diagnosis not present

## 2015-04-27 DIAGNOSIS — Z79899 Other long term (current) drug therapy: Secondary | ICD-10-CM | POA: Diagnosis not present

## 2015-04-27 DIAGNOSIS — S0990XA Unspecified injury of head, initial encounter: Secondary | ICD-10-CM | POA: Diagnosis present

## 2015-04-27 DIAGNOSIS — S50812A Abrasion of left forearm, initial encounter: Secondary | ICD-10-CM | POA: Insufficient documentation

## 2015-04-27 LAB — CBC WITH DIFFERENTIAL/PLATELET
Basophils Absolute: 0 10*3/uL (ref 0.0–0.1)
Basophils Relative: 1 %
Eosinophils Absolute: 0.1 10*3/uL (ref 0.0–0.7)
Eosinophils Relative: 2 %
HEMATOCRIT: 34.6 % — AB (ref 36.0–46.0)
Hemoglobin: 11.7 g/dL — ABNORMAL LOW (ref 12.0–15.0)
LYMPHS PCT: 21 %
Lymphs Abs: 1.2 10*3/uL (ref 0.7–4.0)
MCH: 31.9 pg (ref 26.0–34.0)
MCHC: 33.8 g/dL (ref 30.0–36.0)
MCV: 94.3 fL (ref 78.0–100.0)
MONO ABS: 0.7 10*3/uL (ref 0.1–1.0)
MONOS PCT: 12 %
NEUTROS ABS: 3.7 10*3/uL (ref 1.7–7.7)
Neutrophils Relative %: 64 %
Platelets: 190 10*3/uL (ref 150–400)
RBC: 3.67 MIL/uL — ABNORMAL LOW (ref 3.87–5.11)
RDW: 14.8 % (ref 11.5–15.5)
WBC: 5.8 10*3/uL (ref 4.0–10.5)

## 2015-04-27 LAB — BASIC METABOLIC PANEL
Anion gap: 11 (ref 5–15)
BUN: 10 mg/dL (ref 6–20)
CALCIUM: 8.7 mg/dL — AB (ref 8.9–10.3)
CO2: 23 mmol/L (ref 22–32)
CREATININE: 0.92 mg/dL (ref 0.44–1.00)
Chloride: 104 mmol/L (ref 101–111)
GFR calc Af Amer: >60 mL/min (ref >60)
GFR calc non Af Amer: 59 mL/min — ABNORMAL LOW (ref >60)
GLUCOSE: 115 mg/dL — AB (ref 65–99)
Potassium: 2.9 mmol/L — ABNORMAL LOW (ref 3.5–5.1)
Sodium: 138 mmol/L (ref 135–145)

## 2015-04-27 LAB — VITAMIN D 25 HYDROXY (VIT D DEFICIENCY, FRACTURES): Vit D, 25-Hydroxy: 38 ng/mL (ref 30–100)

## 2015-04-27 LAB — I-STAT TROPONIN, ED: Troponin i, poc: 0.01 ng/mL (ref 0.00–0.08)

## 2015-04-27 MED ORDER — POTASSIUM CHLORIDE 10 MEQ/100ML IV SOLN
10.0000 meq | Freq: Once | INTRAVENOUS | Status: AC
  Administered 2015-04-27: 10 meq via INTRAVENOUS
  Filled 2015-04-27: qty 100

## 2015-04-27 MED ORDER — POTASSIUM CHLORIDE CRYS ER 20 MEQ PO TBCR: 20.0000 meq | EXTENDED_RELEASE_TABLET | Freq: Every day | ORAL | Status: DC

## 2015-04-27 MED ORDER — ALPRAZOLAM 0.25 MG PO TABS
0.2500 mg | ORAL_TABLET | Freq: Once | ORAL | Status: AC
  Administered 2015-04-27: 0.25 mg via ORAL
  Filled 2015-04-27: qty 1

## 2015-04-27 MED ORDER — SODIUM CHLORIDE 0.9 % IV BOLUS (SEPSIS)
1000.0000 mL | Freq: Once | INTRAVENOUS | Status: AC
  Administered 2015-04-27: 1000 mL via INTRAVENOUS

## 2015-04-27 MED ORDER — POTASSIUM CHLORIDE CRYS ER 20 MEQ PO TBCR
40.0000 meq | EXTENDED_RELEASE_TABLET | Freq: Once | ORAL | Status: AC
  Administered 2015-04-27: 40 meq via ORAL
  Filled 2015-04-27: qty 2

## 2015-04-27 NOTE — ED Provider Notes (Addendum)
CSN: [id number]     Arrival date & time 04/27/15  1542 History   First MD Initiated Contact with Patient 04/27/15 1634     Chief Complaint  Patient presents with  . Fall     (Consider location/radiation/quality/duration/timing/severity/associated sxs/prior Treatment) The history is provided by the patient.  [name] is a 79 y.o. female hx of HL, HTN, GERD, DM, reflux, here presenting with fall. States that she fell once yesterday and the low time today. She states that her legs felt weak yesterday and she fell but didn't injure anything. Today she was bending over and tried to stand up but then instead hit the back of her head. Denies any neck pain. Denies any room spinning or vertiginous symptoms or chest pain. She was seen by primary care doctor yesterday and had normal CBC, CMP. States that she does not fall very often and she lives at home by herself. She denies being on blood thinners.    Past Medical History  Diagnosis Date  . Dyslipidemia   . Sleep apnea     5 yrs --cpap  does not use   . Heart murmur   . H/O hiatal hernia   . Arthritis   . Hyperlipidemia   . Hypertension   . GERD (gastroesophageal reflux disease)   . Depression   . Diabetes mellitus     diet controlled  . Hypothyroidism    Past Surgical History  Procedure Laterality Date  . Breast tumor      secondary to fibrocystic disese  . Cholecystctomy    . Left hip replacement    . Abdominal hysterectomy    . Cardiac catheterization      normal dr. Fredrich Birks  . Joint replacement      left hip  . Cholecystectomy    . Total shoulder arthroplasty  11/15/2011    Procedure: TOTAL SHOULDER ARTHROPLASTY;  Surgeon: Senaida Lange, MD;  Location: [facility] OR;  Service: Orthopedics;  Laterality: Left;  left total shoulder arthroplasty   Family History  Problem Relation Age of Onset  . Anesthesia problems Neg Hx   . Hypotension Neg Hx   . Malignant hyperthermia Neg Hx   . Pseudochol deficiency Neg Hx   . Cancer  Mother   . Diabetes Mother   . Heart attack Mother   . Heart disease Father   . COPD Son    Social History  Substance Use Topics  . Smoking status: Former Smoker    Quit date: 08/06/1990  . Smokeless tobacco: Never Used  . Alcohol Use: No   OB History    No data available     Review of Systems  Neurological: Positive for weakness.  All other systems reviewed and are negative.     Allergies  Iodinated diagnostic agents; Iohexol; Lipitor; Septra; Tetanus toxoid adsorbed; Tetanus toxoids; Prednisone; Seldane; and Tetanus toxoid  Home Medications   Prior to Admission medications   Medication Sig Start Date End Date Taking? Authorizing Provider  ALPRAZolam (XANAX) 0.25 MG tablet Take 0.25 mg by mouth daily as needed. Anxiety As needed 07/01/11  Yes Historical Provider, MD  buPROPion (WELLBUTRIN) 100 MG tablet Take 100 mg by mouth 2 (two) times daily.   Yes Historical Provider, MD  Cetirizine HCl (ZYRTEC ALLERGY) 10 MG CAPS Take 1 capsule (10 mg total) by mouth at bedtime. 01/14/15  Yes Courtney Forcucci, PA-C  Cholecalciferol (VITAMIN D PO) Take 1 capsule by mouth daily.    Yes Historical Provider, MD  escitalopram (LEXAPRO) 10 MG tablet Take 10 mg by mouth 2 (two) times daily.    Yes Historical Provider, MD  hydrochlorothiazide (HYDRODIURIL) 25 MG tablet Take 1 tablet daily for BP & Fluid 10/05/14  Yes Lucky Cowboy, MD  HYDROcodone-acetaminophen [facility]) 10-325 MG per tablet Take 1 tablet by mouth every 6 (six) hours as needed for moderate pain.    Yes Historical Provider, MD  lamoTRIgine (LAMICTAL) 100 MG tablet Take 100 mg by mouth at bedtime.   Yes Historical Provider, MD  levothyroxine (SYNTHROID, LEVOTHROID) 175 MCG tablet Take 1 tablet (175 mcg total) by mouth daily. 10/11/14  Yes Lucky Cowboy, MD  lisinopril (PRINIVIL,ZESTRIL) 40 MG tablet Take 40 mg by mouth daily.   Yes Historical Provider, MD  MAGNESIUM PO Take 1 tablet by mouth daily.   Yes Historical Provider, MD   modafinil (PROVIGIL) 200 MG tablet TAKE ONE TABLET BY MOUTH TWICE DAILY 08/31/14  Yes Lucky Cowboy, MD  montelukast (SINGULAIR) 10 MG tablet Take 1 tablet (10 mg total) by mouth daily. 01/14/15 01/14/16 Yes Courtney Forcucci, PA-C  omeprazole (PRILOSEC) 40 MG capsule TAKE ONE CAPSULE BY MOUTH ONCE DAILY FOR  HEARTBURN 10/11/14  Yes Lucky Cowboy, MD  rosuvastatin (CRESTOR) 20 MG tablet Take 1 tablet (20 mg total) by mouth daily. 10/05/14  Yes Lucky Cowboy, MD  traZODone (DESYREL) 50 MG tablet Take 1 tablet (50 mg total) by mouth at bedtime. 01/06/15  Yes Courtney Forcucci, PA-C  lisinopril (PRINIVIL,ZESTRIL) 40 MG tablet Take 1 tablet daily for BP & Kidneys 10/05/14 04/07/15  Lucky Cowboy, MD  triamcinolone cream (KENALOG) 0.1 % Apply 1 application topically 2 (two) times daily. Patient not taking: Reported on 04/27/2015 04/26/15   Quentin Mulling, PA-C   BP 170/80 mmHg  Pulse 72  Temp(Src) 98.4 F (36.9 C) (Oral)  Resp 15  Ht  (1.6 m)  Wt 179 lb (81.194 kg)  BMI 31.72 kg/m2  SpO2 97% Physical Exam  Constitutional: She is oriented to person, place, and time. She appears well-developed and well-nourished.  HENT:  Head: Normocephalic.  Mouth/Throat: Oropharynx is clear and moist.  Small posterior hematoma.   Eyes: Conjunctivae are normal. Pupils are equal, round, and reactive to light.  Neck: Normal range of motion. Neck supple.  No midline tenderness   Cardiovascular: Normal rate, regular rhythm and normal heart sounds.   Pulmonary/Chest: Effort normal and breath sounds normal. No respiratory distress. She has no wheezes. She has no rales.  Abdominal: Soft. Bowel sounds are normal. She exhibits no distension. There is no tenderness. There is no rebound.  Musculoskeletal: Normal range of motion.  L forearm abrasion, no bony tenderness   Neurological: She is alert and oriented to person, place, and time. No cranial nerve deficit. Coordination normal.  CN 2-12 intact. Nl strength  throughout   Skin: Skin is warm and dry.  Psychiatric: She has a normal mood and affect. Her behavior is normal. Judgment and thought content normal.  Nursing note and vitals reviewed.   ED Course  Procedures (including critical care time) Labs Review Labs Reviewed  CBC WITH DIFFERENTIAL/PLATELET - Abnormal; Notable for the following:    RBC 3.67 (*)    Hemoglobin 11.7 (*)    HCT 34.6 (*)    All other components within normal limits  BASIC METABOLIC PANEL - Abnormal; Notable for the following:    Potassium 2.9 (*)    Glucose, Bld 115 (*)    Calcium 8.7 (*)    GFR calc non Af  Amer 59 (*)    All other components within normal limits  I-STAT TROPOININ, ED    Imaging Review Ct Head Wo Contrast  04/27/2015   CLINICAL DATA:  Fall, occipital head trauma  EXAM: CT HEAD WITHOUT CONTRAST  TECHNIQUE: Contiguous axial images were obtained from the base of the skull through the vertex without intravenous contrast.  COMPARISON:  05/01/2013  FINDINGS: High left occipital scalp swelling is identified. No subjacent skull fracture. Orbits and paranasal sinuses are unremarkable. Periventricular white matter hypodensities compatible with small vessel ischemic change. No acute hemorrhage, infarct, or mass lesion is identified. No midline shift. Mild diffuse cortical volume loss with proportional ventricular prominence.  IMPRESSION: No acute intracranial abnormality. Left occipital scalp swelling. No underlying skull fracture identified.   Electronically Signed   By: Christiana Pellant M.D.   On: 04/27/2015 18:38   I have personally reviewed and evaluated these images and lab results as part of my medical decision-making.   EKG Interpretation   Date/Time:  Wednesday April 27 2015 16:55:28 EDT Ventricular Rate:  56 PR Interval:  179 QRS Duration: 90 QT Interval:  689 QTC Calculation: 665 R Axis:   75 Text Interpretation:  Sinus rhythm Low voltage, precordial leads  Borderline abnrm T,  anterolateral leads nl QTc (machine overestimated QT)  Confirmed by YAO  MD, DAVID ([id number]) on 04/27/2015 4:58:29 PM      MDM   Final diagnoses:  None   Otis Burress Magner is a 79 y.o. female here with fall. Denies syncope or chest pain. Will repeat labs, EKG. Will get CT head.   7:57 PM CT head unremarkable. K 2.9, changed from yesterday. Not orthostatic. Given potassium in the ED. Will dc home with Kdur 20 meq x 3 doses. Will have her see PCP in a week to recheck potassium. BP elevated but patient on BP meds and has been compliant. BP on discharge was 170/80    Richardean Canal, MD 04/27/15 1957  Richardean Canal, MD 04/27/15 [phone]

## 2015-04-27 NOTE — ED Notes (Signed)
Pt here for fall yesterday and today. sts she doesn't really know why she fell except for her legs are weak. Pt st s she hit her head and injured left FA. Pt hypertensive.

## 2015-04-27 NOTE — Discharge Instructions (Signed)
Take kdur 20 meq daily for 3 days.   See your doctor in a week to recheck blood pressure and potassium.   Take your blood pressure medicines as prescribed.  Return to ER if you have worse weakness, vomiting, fevers, falls.

## 2015-04-27 NOTE — Addendum Note (Signed)
Addended by: Quentin Mulling R on: 04/27/2015 04:41 PM   Modules accepted: Orders, SmartSet

## 2015-04-27 NOTE — ED Notes (Signed)
MD aware of fluctuating blood pressures. Pt denies becoming dizzy during orthostatic vital signs. Pt also requesting a xanax due to anxiety related to ER visit. MD agrees and will order.

## 2015-04-28 ENCOUNTER — Other Ambulatory Visit: Payer: Self-pay | Admitting: Internal Medicine

## 2015-04-28 LAB — MICROALBUMIN / CREATININE URINE RATIO
Creatinine, Urine: 222.5 mg/dL
MICROALB/CREAT RATIO: 16.2 mg/g (ref 0.0–30.0)
Microalb, Ur: 3.6 mg/dL — ABNORMAL HIGH (ref <2.0)

## 2015-05-04 DIAGNOSIS — M6281 Muscle weakness (generalized): Secondary | ICD-10-CM | POA: Diagnosis not present

## 2015-05-04 DIAGNOSIS — R262 Difficulty in walking, not elsewhere classified: Secondary | ICD-10-CM | POA: Diagnosis not present

## 2015-05-04 DIAGNOSIS — R296 Repeated falls: Secondary | ICD-10-CM | POA: Diagnosis not present

## 2015-05-06 DIAGNOSIS — M6281 Muscle weakness (generalized): Secondary | ICD-10-CM | POA: Diagnosis not present

## 2015-05-06 DIAGNOSIS — R296 Repeated falls: Secondary | ICD-10-CM | POA: Diagnosis not present

## 2015-05-06 DIAGNOSIS — R262 Difficulty in walking, not elsewhere classified: Secondary | ICD-10-CM | POA: Diagnosis not present

## 2015-05-08 ENCOUNTER — Encounter (HOSPITAL_COMMUNITY): Payer: Self-pay | Admitting: Nurse Practitioner

## 2015-05-08 ENCOUNTER — Emergency Department (HOSPITAL_COMMUNITY): Payer: Medicare Other

## 2015-05-08 ENCOUNTER — Emergency Department (HOSPITAL_COMMUNITY)
Admission: EM | Admit: 2015-05-08 | Discharge: 2015-05-08 | Disposition: A | Discharge status: Home / Self Care | Payer: Medicare Other | Attending: Emergency Medicine | Admitting: Emergency Medicine

## 2015-05-08 DIAGNOSIS — Y9389 Activity, other specified: Secondary | ICD-10-CM | POA: Diagnosis not present

## 2015-05-08 DIAGNOSIS — E119 Type 2 diabetes mellitus without complications: Secondary | ICD-10-CM | POA: Diagnosis not present

## 2015-05-08 DIAGNOSIS — Z79899 Other long term (current) drug therapy: Secondary | ICD-10-CM | POA: Diagnosis not present

## 2015-05-08 DIAGNOSIS — F329 Major depressive disorder, single episode, unspecified: Secondary | ICD-10-CM | POA: Diagnosis not present

## 2015-05-08 DIAGNOSIS — M1990 Unspecified osteoarthritis, unspecified site: Secondary | ICD-10-CM | POA: Diagnosis not present

## 2015-05-08 DIAGNOSIS — Y998 Other external cause status: Secondary | ICD-10-CM | POA: Insufficient documentation

## 2015-05-08 DIAGNOSIS — S79912A Unspecified injury of left hip, initial encounter: Secondary | ICD-10-CM | POA: Diagnosis not present

## 2015-05-08 DIAGNOSIS — W1839XA Other fall on same level, initial encounter: Secondary | ICD-10-CM | POA: Insufficient documentation

## 2015-05-08 DIAGNOSIS — Z8669 Personal history of other diseases of the nervous system and sense organs: Secondary | ICD-10-CM | POA: Diagnosis not present

## 2015-05-08 DIAGNOSIS — Z87891 Personal history of nicotine dependence: Secondary | ICD-10-CM | POA: Insufficient documentation

## 2015-05-08 DIAGNOSIS — M25552 Pain in left hip: Secondary | ICD-10-CM | POA: Diagnosis not present

## 2015-05-08 DIAGNOSIS — S8991XA Unspecified injury of right lower leg, initial encounter: Secondary | ICD-10-CM | POA: Diagnosis not present

## 2015-05-08 DIAGNOSIS — S0993XA Unspecified injury of face, initial encounter: Secondary | ICD-10-CM | POA: Insufficient documentation

## 2015-05-08 DIAGNOSIS — E039 Hypothyroidism, unspecified: Secondary | ICD-10-CM | POA: Insufficient documentation

## 2015-05-08 DIAGNOSIS — Y92009 Unspecified place in unspecified non-institutional (private) residence as the place of occurrence of the external cause: Secondary | ICD-10-CM | POA: Insufficient documentation

## 2015-05-08 DIAGNOSIS — M1611 Unilateral primary osteoarthritis, right hip: Secondary | ICD-10-CM | POA: Diagnosis not present

## 2015-05-08 DIAGNOSIS — R51 Headache: Secondary | ICD-10-CM | POA: Diagnosis not present

## 2015-05-08 DIAGNOSIS — I10 Essential (primary) hypertension: Secondary | ICD-10-CM | POA: Diagnosis not present

## 2015-05-08 DIAGNOSIS — E785 Hyperlipidemia, unspecified: Secondary | ICD-10-CM | POA: Insufficient documentation

## 2015-05-08 DIAGNOSIS — S0541XA Penetrating wound of orbit with or without foreign body, right eye, initial encounter: Secondary | ICD-10-CM | POA: Diagnosis not present

## 2015-05-08 DIAGNOSIS — S0081XA Abrasion of other part of head, initial encounter: Secondary | ICD-10-CM | POA: Diagnosis not present

## 2015-05-08 DIAGNOSIS — R011 Cardiac murmur, unspecified: Secondary | ICD-10-CM | POA: Diagnosis not present

## 2015-05-08 DIAGNOSIS — R6889 Other general symptoms and signs: Secondary | ICD-10-CM | POA: Diagnosis not present

## 2015-05-08 DIAGNOSIS — W19XXXA Unspecified fall, initial encounter: Secondary | ICD-10-CM

## 2015-05-08 DIAGNOSIS — M25561 Pain in right knee: Secondary | ICD-10-CM | POA: Diagnosis not present

## 2015-05-08 MED ORDER — HYDROCODONE-ACETAMINOPHEN 5-325 MG PO TABS
2.0000 | ORAL_TABLET | Freq: Once | ORAL | Status: AC
  Administered 2015-05-08: 2 via ORAL
  Filled 2015-05-08: qty 2

## 2015-05-08 NOTE — ED Notes (Signed)
Pt is presented from home, reports a fall that resulted to right hip pain, right knee which has superficial laceration, and right eye which also has laceration to the orbital aspect. Denies LOC and not anticoagulants.

## 2015-05-08 NOTE — Discharge Instructions (Signed)
Fall Prevention and Home Safety °Falls cause injuries and can affect all age groups. It is possible to use preventive measures to significantly decrease the likelihood of falls. There are many simple measures which can make your home safer and prevent falls. °OUTDOORS °· Repair cracks and edges of walkways and driveways. °· Remove high doorway thresholds. °· Trim shrubbery on the main path into your home. °· Have good outside lighting. °· Clear walkways of tools, rocks, debris, and clutter. °· Check that handrails are not broken and are securely fastened. Both sides of steps should have handrails. °· Have leaves, snow, and ice cleared regularly. °· Use sand or salt on walkways during winter months. °· In the garage, clean up grease or oil spills. °BATHROOM °· Install night lights. °· Install grab bars by the toilet and in the tub and shower. °· Use non-skid mats or decals in the tub or shower. °· Place a plastic non-slip stool in the shower to sit on, if needed. °· Keep floors dry and clean up all water on the floor immediately. °· Remove soap buildup in the tub or shower on a regular basis. °· Secure bath mats with non-slip, double-sided rug tape. °· Remove throw rugs and tripping hazards from the floors. °BEDROOMS °· Install night lights. °· Make sure a bedside light is easy to reach. °· Do not use oversized bedding. °· Keep a telephone by your bedside. °· Have a firm chair with side arms to use for getting dressed. °· Remove throw rugs and tripping hazards from the floor. °KITCHEN °· Keep handles on pots and pans turned toward the center of the stove. Use back burners when possible. °· Clean up spills quickly and allow time for drying. °· Avoid walking on wet floors. °· Avoid hot utensils and knives. °· Position shelves so they are not too high or low. °· Place commonly used objects within easy reach. °· If necessary, use a sturdy step stool with a grab bar when reaching. °· Keep electrical cables out of the  way. °· Do not use floor polish or wax that makes floors slippery. If you must use wax, use non-skid floor wax. °· Remove throw rugs and tripping hazards from the floor. °STAIRWAYS °· Never leave objects on stairs. °· Place handrails on both sides of stairways and use them. Fix any loose handrails. Make sure handrails on both sides of the stairways are as long as the stairs. °· Check carpeting to make sure it is firmly attached along stairs. Make repairs to worn or loose carpet promptly. °· Avoid placing throw rugs at the top or bottom of stairways, or properly secure the rug with carpet tape to prevent slippage. Get rid of throw rugs, if possible. °· Have an electrician put in a light switch at the top and bottom of the stairs. °OTHER FALL PREVENTION TIPS °· Wear low-heel or rubber-soled shoes that are supportive and fit well. Wear closed toe shoes. °· When using a stepladder, make sure it is fully opened and both spreaders are firmly locked. Do not climb a closed stepladder. °· Add color or contrast paint or tape to grab bars and handrails in your home. Place contrasting color strips on first and last steps. °· Learn and use mobility aids as needed. Install an electrical emergency response system. °· Turn on lights to avoid dark areas. Replace light bulbs that burn out immediately. Get light switches that glow. °· Arrange furniture to create clear pathways. Keep furniture in the same place. °·   Firmly attach carpet with non-skid or double-sided tape. °· Eliminate uneven floor surfaces. °· Select a carpet pattern that does not visually hide the edge of steps. °· Be aware of all pets. °OTHER HOME SAFETY TIPS °· Set the water temperature for 120° F (48.8° C). °· Keep emergency numbers on or near the telephone. °· Keep smoke detectors on every level of the home and near sleeping areas. °Document Released: 07/13/2002 Document Revised: 01/22/2012 Document Reviewed: 10/12/2011 °ExitCare® Patient Information ©2015  [employer]. This information is not intended to replace advice given to you by your health care provider. Make sure you discuss any questions you have with your health care provider. ° °Hypertension °Hypertension, commonly called high blood pressure, is when the force of blood pumping through your arteries is too strong. Your arteries are the blood vessels that carry blood from your heart throughout your body. A blood pressure reading consists of a higher number over a lower number, such as 110/72. The higher number (systolic) is the pressure inside your arteries when your heart pumps. The lower number (diastolic) is the pressure inside your arteries when your heart relaxes. Ideally you want your blood pressure below 120/80. °Hypertension forces your heart to work harder to pump blood. Your arteries may become narrow or stiff. Having hypertension puts you at risk for heart disease, stroke, and other problems.  °RISK FACTORS °Some risk factors for high blood pressure are controllable. Others are not.  °Risk factors you cannot control include:  °· Race. You may be at higher risk if you are African American. °· Age. Risk increases with age. °· Gender. Men are at higher risk than women before age 45 years. After age 65, women are at higher risk than men. °Risk factors you can control include: °· Not getting enough exercise or physical activity. °· Being overweight. °· Getting too much fat, sugar, calories, or salt in your diet. °· Drinking too much alcohol. °SIGNS AND SYMPTOMS °Hypertension does not usually cause signs or symptoms. Extremely high blood pressure (hypertensive crisis) may cause headache, anxiety, shortness of breath, and nosebleed. °DIAGNOSIS  °To check if you have hypertension, your health care provider will measure your blood pressure while you are seated, with your arm held at the level of your heart. It should be measured at least twice using the same arm. Certain conditions can cause a difference  in blood pressure between your right and left arms. A blood pressure reading that is higher than normal on one occasion does not mean that you need treatment. If one blood pressure reading is high, ask your health care provider about having it checked again. °TREATMENT  °Treating high blood pressure includes making lifestyle changes and possibly taking medicine. Living a healthy lifestyle can help lower high blood pressure. You may need to change some of your habits. °Lifestyle changes may include: °· Following the DASH diet. This diet is high in fruits, vegetables, and whole grains. It is low in salt, red meat, and added sugars. °· Getting at least 2½ hours of brisk physical activity every week. °· Losing weight if necessary. °· Not smoking. °· Limiting alcoholic beverages. °· Learning ways to reduce stress. ° If lifestyle changes are not enough to get your blood pressure under control, your health care provider may prescribe medicine. You may need to take more than one. Work closely with your health care provider to understand the risks and benefits. °HOME CARE INSTRUCTIONS °· Have your blood pressure rechecked as directed by your health care   provider.   °· Take medicines only as directed by your health care provider. Follow the directions carefully. Blood pressure medicines must be taken as prescribed. The medicine does not work as well when you skip doses. Skipping doses also puts you at risk for problems.   °· Do not smoke.   °· Monitor your blood pressure at home as directed by your health care provider.  °SEEK MEDICAL CARE IF:  °· You think you are having a reaction to medicines taken. °· You have recurrent headaches or feel dizzy. °· You have swelling in your ankles. °· You have trouble with your vision. °SEEK IMMEDIATE MEDICAL CARE IF: °· You develop a severe headache or confusion. °· You have unusual weakness, numbness, or feel faint. °· You have severe chest or abdominal pain. °· You vomit  repeatedly. °· You have trouble breathing. °MAKE SURE YOU:  °· Understand these instructions. °· Will watch your condition. °· Will get help right away if you are not doing well or get worse. °Document Released: 07/23/2005 Document Revised: 12/07/2013 Document Reviewed: 05/15/2013 °ExitCare® Patient Information ©2015 [employer]. This information is not intended to replace advice given to you by your health care provider. Make sure you discuss any questions you have with your health care provider. ° °

## 2015-05-08 NOTE — ED Provider Notes (Signed)
CSN: [id number]     Arrival date & time 05/08/15  1627 History   First MD Initiated Contact with Patient 05/08/15 1645     Chief Complaint  Patient presents with  . Fall    Right hip pain, left knee, Right eye.     (Consider location/radiation/quality/duration/timing/severity/associated sxs/prior Treatment) Patient is a 79 y.o. female presenting with fall. The history is provided by the patient.  Fall This is a recurrent problem. The current episode started 1 to 2 hours ago. Episode frequency: once. The problem has not changed since onset.Pertinent negatives include no abdominal pain and no shortness of breath. Nothing aggravates the symptoms. Nothing relieves the symptoms. She has tried nothing for the symptoms.    Past Medical History  Diagnosis Date  . Dyslipidemia   . Sleep apnea     5 yrs --cpap  does not use   . Heart murmur   . H/O hiatal hernia   . Arthritis   . Hyperlipidemia   . Hypertension   . GERD (gastroesophageal reflux disease)   . Depression   . Diabetes mellitus     diet controlled  . Hypothyroidism    Past Surgical History  Procedure Laterality Date  . Breast tumor      secondary to fibrocystic disese  . Cholecystctomy    . Left hip replacement    . Abdominal hysterectomy    . Cardiac catheterization      normal dr. Fredrich Birks  . Joint replacement      left hip  . Cholecystectomy    . Total shoulder arthroplasty  11/15/2011    Procedure: TOTAL SHOULDER ARTHROPLASTY;  Surgeon: Senaida Lange, MD;  Location: [facility] OR;  Service: Orthopedics;  Laterality: Left;  left total shoulder arthroplasty   Family History  Problem Relation Age of Onset  . Anesthesia problems Neg Hx   . Hypotension Neg Hx   . Malignant hyperthermia Neg Hx   . Pseudochol deficiency Neg Hx   . Cancer Mother   . Diabetes Mother   . Heart attack Mother   . Heart disease Father   . COPD Son    Social History  Substance Use Topics  . Smoking status: Former Smoker    Quit date:  08/06/1990  . Smokeless tobacco: Never Used  . Alcohol Use: No   OB History    No data available     Review of Systems  Constitutional: Negative for fever.  Respiratory: Negative for cough and shortness of breath.   Gastrointestinal: Negative for vomiting and abdominal pain.  All other systems reviewed and are negative.     Allergies  Iodinated diagnostic agents; Iohexol; Lipitor; Septra; Tetanus toxoid adsorbed; Tetanus toxoids; Prednisone; Seldane; and Tetanus toxoid  Home Medications   Prior to Admission medications   Medication Sig Start Date End Date Taking? Authorizing Provider  ALPRAZolam (XANAX) 0.25 MG tablet Take 0.25 mg by mouth daily as needed. Anxiety As needed 07/01/11  Yes Historical Provider, MD  buPROPion (WELLBUTRIN) 100 MG tablet Take 100 mg by mouth 2 (two) times daily.   Yes Historical Provider, MD  Cetirizine HCl (ZYRTEC ALLERGY) 10 MG CAPS Take 1 capsule (10 mg total) by mouth at bedtime. Patient taking differently: Take 10 mg by mouth daily as needed (allergies).  01/14/15  Yes Courtney Forcucci, PA-C  Cholecalciferol (VITAMIN D PO) Take 1 capsule by mouth daily.    Yes Historical Provider, MD  escitalopram (LEXAPRO) 10 MG tablet Take 10 mg by mouth 2 (two)  times daily.    Yes Historical Provider, MD  HYDROcodone-acetaminophen (NORCO) 10-325 MG per tablet Take 1 tablet by mouth every 6 (six) hours as needed for moderate pain.    Yes Historical Provider, MD  lamoTRIgine (LAMICTAL) 100 MG tablet Take 100 mg by mouth at bedtime.   Yes Historical Provider, MD  levothyroxine (SYNTHROID, LEVOTHROID) 175 MCG tablet Take 1 tablet (175 mcg total) by mouth daily. 10/11/14  Yes Lucky Cowboy, MD  lisinopril (PRINIVIL,ZESTRIL) 40 MG tablet Take 1 tablet daily for BP & Kidneys Patient taking differently: Take 40 mg by mouth daily.  10/05/14 05/08/15 Yes Lucky Cowboy, MD  loteprednol (LOTEMAX) 0.5 % ophthalmic suspension Place 1 drop into both eyes daily as needed (eye  irritation).    Yes Historical Provider, MD  MAGNESIUM PO Take 1 tablet by mouth daily.   Yes Historical Provider, MD  modafinil (PROVIGIL) 200 MG tablet TAKE ONE TABLET BY MOUTH TWICE DAILY Patient taking differently: TAKE 200 MG BY MOUTH TWICE DAILY 08/31/14  Yes Lucky Cowboy, MD  montelukast (SINGULAIR) 10 MG tablet Take 1 tablet (10 mg total) by mouth daily. 01/14/15 01/14/16 Yes Courtney Forcucci, PA-C  olopatadine (PATANOL) 0.1 % ophthalmic solution Place 1 drop into both eyes daily as needed for allergies.  04/27/15  Yes Historical Provider, MD  omeprazole (PRILOSEC) 40 MG capsule TAKE ONE CAPSULE BY MOUTH ONCE DAILY FOR  HEARTBURN Patient taking differently: Take 40 mg by mouth daily.  10/11/14  Yes Lucky Cowboy, MD  rosuvastatin (CRESTOR) 20 MG tablet Take 1 tablet (20 mg total) by mouth daily. 10/05/14  Yes Lucky Cowboy, MD  traZODone (DESYREL) 50 MG tablet Take 1 tablet (50 mg total) by mouth at bedtime. Patient taking differently: Take 50 mg by mouth at bedtime as needed for sleep.  01/06/15  Yes Courtney Forcucci, PA-C  hydrochlorothiazide (HYDRODIURIL) 25 MG tablet Take 1 tablet daily for BP & Fluid Patient not taking: Reported on 05/08/2015 10/05/14   Lucky Cowboy, MD  potassium chloride SA (K-DUR,KLOR-CON) 20 MEQ tablet Take 1 tablet (20 mEq total) by mouth daily. Patient not taking: Reported on 05/08/2015 04/27/15   Richardean Canal, MD  triamcinolone cream (KENALOG) 0.1 % Apply 1 application topically 2 (two) times daily. Patient not taking: Reported on 04/27/2015 04/26/15   Quentin Mulling, PA-C   BP 199/115 mmHg  Pulse 58  Temp(Src) 98.5 F (36.9 C) (Oral)  Resp 16  SpO2 95% Physical Exam  Constitutional: She is oriented to person, place, and time. She appears well-developed and well-nourished. No distress.  HENT:  Head: Normocephalic.    Mouth/Throat: Oropharynx is clear and moist.  Eyes: EOM are normal. Pupils are equal, round, and reactive to light.  Neck: Normal range of  motion. Neck supple.  Cardiovascular: Normal rate and regular rhythm.  Exam reveals no friction rub.   No murmur heard. Pulmonary/Chest: Effort normal and breath sounds normal. No respiratory distress. She has no wheezes. She has no rales.  Abdominal: Soft. She exhibits no distension. There is no tenderness. There is no rebound.  Musculoskeletal: Normal range of motion. She exhibits no edema.       Left hip: She exhibits bony tenderness (left lateral). She exhibits normal range of motion, no deformity and no laceration.  Neurological: She is alert and oriented to person, place, and time. No cranial nerve deficit. She exhibits normal muscle tone. Coordination normal.  Skin: No rash noted. She is not diaphoretic.  Nursing note and vitals reviewed.   ED Course  Procedures (including critical care time) Labs Review Labs Reviewed - No data to display  Imaging Review Dg Pelvis 1-2 Views  05/08/2015   CLINICAL DATA:  Pt c/o multiple falls this week, states she slipped, fell today while in yard picking up sticks, down several steps. Pt c/p right leg pain.  EXAM: PELVIS - 1-2 VIEW  COMPARISON:  None.  FINDINGS: There are unremarkable appearances of the left total hip arthroplasty. Mild chronic fragmentation is incidentally noted about the left trochanters. The right hip is intact, with no evidence of acute fracture or dislocation. Moderate right hip arthritic changes are present. There is no bone lesion or bony destruction. No acute soft tissue abnormality is evident.  IMPRESSION: Negative for acute fracture. Moderate right hip arthritis. Intact appearances of the left total hip arthroplasty.   Electronically Signed   By: Ellery Plunk M.D.   On: 05/08/2015 18:39   Ct Head Wo Contrast  05/08/2015   CLINICAL DATA:  Fall.  Right orbital laceration.  EXAM: CT HEAD WITHOUT CONTRAST  CT MAXILLOFACIAL WITHOUT CONTRAST  TECHNIQUE: Multidetector CT imaging of the head and maxillofacial structures were  performed using the standard protocol without intravenous contrast. Multiplanar CT image reconstructions of the maxillofacial structures were also generated.  COMPARISON:  04/27/2015 head CT.  FINDINGS: CT HEAD FINDINGS  No evidence of parenchymal hemorrhage or extra-axial fluid collection. No mass lesion, mass effect, or midline shift. No CT evidence of acute infarction. Cerebral volume is age appropriate. No ventriculomegaly. Intracranial arterial calcifications. Nonspecific periventricular white matter hypodensity, most in keeping with chronic small vessel ischemic change.  The visualized paranasal sinuses are essentially clear. The mastoid air cells are unopacified. No evidence of calvarial fracture. Partially visualized is a left shoulder arthroplasty.  CT MAXILLOFACIAL FINDINGS  There is no evidence of fracture or malalignment. The maxilla and mandible appear intact. The nasal bone is unremarkable in appearance. The patient is edentulous. Moderate degenerative changes are present in the visualized mid to lower cervical spine. There is straightening of the cervical spine. There is mild 2 mm anterolisthesis at C4-5, likely degenerative.  The globes are intact, with history of bilateral lens surgery. The intraconal soft tissues appear normal. There is minimal focal thickening of the medial left lower extraconal soft tissues, likely reflecting mild contusion. There are small mucous retention cysts in the inferior maxillary sinuses bilaterally. Otherwise essentially clear paranasal sinuses with no fluid levels.  No significant soft tissue abnormalities are seen. The parapharyngeal fat planes are preserved. The nasopharynx, oropharynx and hypopharynx are unremarkable in appearance. Atherosclerotic calcifications are seen in the extracranial carotid arteries bilaterally. The parotid and submandibular glands are within normal limits. No cervical lymphadenopathy is seen.  IMPRESSION: 1. No acute intracranial  abnormality.  No calvarial fracture. 2. No maxillofacial fracture. 3. Mild contusion in the medial left lower extraconal soft tissues, with no intraconal soft tissue abnormality. 4. Intracranial atherosclerosis and stable chronic small vessel ischemic white matter change.   Electronically Signed   By: Delbert Phenix M.D.   On: 05/08/2015 21:12   Dg Knee Complete 4 Views Right  05/08/2015   CLINICAL DATA:  Knee pain, status post fall  EXAM: RIGHT KNEE - COMPLETE 4+ VIEW  COMPARISON:  None.  FINDINGS: There is generalized osteopenia. There is no acute fracture or dislocation. There is a small joint effusion. There is mild tricompartmental osteoarthritis of the right knee. There is peripheral vascular atherosclerotic disease.  IMPRESSION: No acute osseous injury of the right knee.  Electronically Signed   By: Elige Ko   On: 05/08/2015 18:47   Ct Maxillofacial Wo Cm  05/08/2015   CLINICAL DATA:  Fall.  Right orbital laceration.  EXAM: CT HEAD WITHOUT CONTRAST  CT MAXILLOFACIAL WITHOUT CONTRAST  TECHNIQUE: Multidetector CT imaging of the head and maxillofacial structures were performed using the standard protocol without intravenous contrast. Multiplanar CT image reconstructions of the maxillofacial structures were also generated.  COMPARISON:  04/27/2015 head CT.  FINDINGS: CT HEAD FINDINGS  No evidence of parenchymal hemorrhage or extra-axial fluid collection. No mass lesion, mass effect, or midline shift. No CT evidence of acute infarction. Cerebral volume is age appropriate. No ventriculomegaly. Intracranial arterial calcifications. Nonspecific periventricular white matter hypodensity, most in keeping with chronic small vessel ischemic change.  The visualized paranasal sinuses are essentially clear. The mastoid air cells are unopacified. No evidence of calvarial fracture. Partially visualized is a left shoulder arthroplasty.  CT MAXILLOFACIAL FINDINGS  There is no evidence of fracture or malalignment. The  maxilla and mandible appear intact. The nasal bone is unremarkable in appearance. The patient is edentulous. Moderate degenerative changes are present in the visualized mid to lower cervical spine. There is straightening of the cervical spine. There is mild 2 mm anterolisthesis at C4-5, likely degenerative.  The globes are intact, with history of bilateral lens surgery. The intraconal soft tissues appear normal. There is minimal focal thickening of the medial left lower extraconal soft tissues, likely reflecting mild contusion. There are small mucous retention cysts in the inferior maxillary sinuses bilaterally. Otherwise essentially clear paranasal sinuses with no fluid levels.  No significant soft tissue abnormalities are seen. The parapharyngeal fat planes are preserved. The nasopharynx, oropharynx and hypopharynx are unremarkable in appearance. Atherosclerotic calcifications are seen in the extracranial carotid arteries bilaterally. The parotid and submandibular glands are within normal limits. No cervical lymphadenopathy is seen.  IMPRESSION: 1. No acute intracranial abnormality.  No calvarial fracture. 2. No maxillofacial fracture. 3. Mild contusion in the medial left lower extraconal soft tissues, with no intraconal soft tissue abnormality. 4. Intracranial atherosclerosis and stable chronic small vessel ischemic white matter change.   Electronically Signed   By: Delbert Phenix M.D.   On: 05/08/2015 21:12   I have personally reviewed and evaluated these images and lab results as part of my medical decision-making.   EKG Interpretation None      MDM   Final diagnoses:  Fall, initial encounter  Essential hypertension    79 year old female here with mechanical fall. She is walking up the steps and tripped. She had no preceding symptoms. No chest pain or shortness of breath. No dizziness. She fell last week also. She has a small abrasion to her right temple and some mild left hip pain. She also has  a right knee laceration. Will x-ray these areas and CT her head and face. She has no EOM entrapment, no visual complaints. She's not on blood thinners.  Imaging all normal. Stable for discharge.  Elwin Mocha, MD 05/09/15 [phone]

## 2015-05-08 NOTE — ED Notes (Signed)
Bed: ZO10 Expected date:  Expected time:  Means of arrival:  Comments: EMS - 79 yo fall; no thinners

## 2015-05-10 ENCOUNTER — Encounter: Payer: Self-pay | Admitting: Physician Assistant

## 2015-05-10 DIAGNOSIS — E1351 Other specified diabetes mellitus with diabetic peripheral angiopathy without gangrene: Secondary | ICD-10-CM | POA: Insufficient documentation

## 2015-05-11 ENCOUNTER — Ambulatory Visit (INDEPENDENT_AMBULATORY_CARE_PROVIDER_SITE_OTHER): Payer: Medicare Other | Admitting: Internal Medicine

## 2015-05-11 ENCOUNTER — Encounter: Payer: Self-pay | Admitting: Internal Medicine

## 2015-05-11 ENCOUNTER — Other Ambulatory Visit: Payer: Self-pay | Admitting: *Deleted

## 2015-05-11 ENCOUNTER — Other Ambulatory Visit: Payer: Self-pay | Admitting: Emergency Medicine

## 2015-05-11 VITALS — BP 168/62 | HR 64 | Temp 97.0°F | Resp 16 | Ht 63.5 in | Wt 169.6 lb

## 2015-05-11 DIAGNOSIS — Z6829 Body mass index (BMI) 29.0-29.9, adult: Secondary | ICD-10-CM

## 2015-05-11 DIAGNOSIS — Z79899 Other long term (current) drug therapy: Secondary | ICD-10-CM

## 2015-05-11 DIAGNOSIS — I10 Essential (primary) hypertension: Secondary | ICD-10-CM | POA: Diagnosis not present

## 2015-05-11 DIAGNOSIS — R296 Repeated falls: Secondary | ICD-10-CM

## 2015-05-11 DIAGNOSIS — E1121 Type 2 diabetes mellitus with diabetic nephropathy: Secondary | ICD-10-CM | POA: Diagnosis not present

## 2015-05-11 DIAGNOSIS — E876 Hypokalemia: Secondary | ICD-10-CM | POA: Diagnosis not present

## 2015-05-11 LAB — CBC WITH DIFFERENTIAL/PLATELET
Basophils Absolute: 0.1 10*3/uL (ref 0.0–0.1)
Basophils Relative: 1 % (ref 0–1)
EOS PCT: 1 % (ref 0–5)
Eosinophils Absolute: 0.1 10*3/uL (ref 0.0–0.7)
HEMATOCRIT: 38.6 % (ref 36.0–46.0)
HEMOGLOBIN: 12.8 g/dL (ref 12.0–15.0)
LYMPHS ABS: 1.5 10*3/uL (ref 0.7–4.0)
LYMPHS PCT: 20 % (ref 12–46)
MCH: 30.7 pg (ref 26.0–34.0)
MCHC: 33.2 g/dL (ref 30.0–36.0)
MCV: 92.6 fL (ref 78.0–100.0)
MONO ABS: 0.6 10*3/uL (ref 0.1–1.0)
MPV: 8.6 fL (ref 8.6–12.4)
Monocytes Relative: 8 % (ref 3–12)
Neutro Abs: 5.1 10*3/uL (ref 1.7–7.7)
Neutrophils Relative %: 70 % (ref 43–77)
Platelets: 287 10*3/uL (ref 150–400)
RBC: 4.17 MIL/uL (ref 3.87–5.11)
RDW: 15 % (ref 11.5–15.5)
WBC: 7.3 10*3/uL (ref 4.0–10.5)

## 2015-05-11 LAB — BASIC METABOLIC PANEL WITH GFR
BUN: 13 mg/dL (ref 7–25)
CO2: 28 mmol/L (ref 20–31)
Calcium: 9.7 mg/dL (ref 8.6–10.4)
Chloride: 103 mmol/L (ref 98–110)
Creat: 0.91 mg/dL (ref 0.60–0.93)
GFR, EST AFRICAN AMERICAN: 71 mL/min (ref >=60)
GFR, Est Non African American: 61 mL/min (ref >=60)
GLUCOSE: 108 mg/dL — AB (ref 65–99)
POTASSIUM: 3.8 mmol/L (ref 3.5–5.3)
Sodium: 140 mmol/L (ref 135–146)

## 2015-05-11 MED ORDER — MODAFINIL 200 MG PO TABS: 200.0000 mg | ORAL_TABLET | Freq: Two times a day (BID) | ORAL | Status: AC

## 2015-05-11 NOTE — Progress Notes (Signed)
Subjective:    Patient ID: [name], female    DOB: 1936/06/15, 79 y.o.   MRN: [id number]  HPI This very nice WWF has had several falls recently . On 9/20.2015 she was seen by and referred by Quentin Mulling for Physical Therapy for gait & balance. On 9/21 she stumbled w/o LOC and hit her head and had a negative ER evaluation including a neg head CT scan. Then she tripped on a curb in a parking lot on 10/2 and was seen by ER again with negative X rays of Rt Hip & Kneg and another Head CT scan which was negative. Patient Denies any postural orthostasis or LOS, but attributes her falls to "being in a hurry". Had low K+ 2.9 in ER - only given 3 doses & will recheck today.  Medication Sig  . ALPRAZolam (XANAX) 0.25 MG tablet Take 0.25 mg by mouth daily as needed. Anxiety As needed  . buPROPion (WELLBUTRIN) 100 MG tablet Take 100 mg by mouth 2 (two) times daily.  . Cetirizine HCl (ZYRTEC ALLERGY) 10 MG CAPS Take 1 capsule (10 mg total) by mouth at bedtime. (Patient taking differently: Take 10 mg by mouth daily as needed (allergies). )  . Cholecalciferol (VITAMIN D PO) Take 1 capsule by mouth daily.   Marland Kitchen escitalopram (LEXAPRO) 10 MG tablet Take 10 mg by mouth 2 (two) times daily.   . hydrochlorothiazide (HYDRODIURIL) 25 MG tablet Take 1 tablet daily for BP & Fluid  . HYDROcodone-acetaminophen (NORCO) 10-325 MG per tablet Take 1 tablet by mouth every 6 (six) hours as needed for moderate pain.   Marland Kitchen lamoTRIgine (LAMICTAL) 100 MG tablet Take 100 mg by mouth at bedtime.  Marland Kitchen levothyroxine (SYNTHROID, LEVOTHROID) 175 MCG tablet Take 1 tablet (175 mcg total) by mouth daily.  Marland Kitchen loteprednol (LOTEMAX) 0.5 % ophthalmic suspension Place 1 drop into both eyes daily as needed (eye irritation).   Marland Kitchen MAGNESIUM PO Take 1 tablet by mouth daily.  . montelukast (SINGULAIR) 10 MG tablet Take 1 tablet (10 mg total) by mouth daily.  Marland Kitchen olopatadine (PATANOL) 0.1 % ophthalmic solution Place 1 drop into both eyes daily as  needed for allergies.   Marland Kitchen omeprazole (PRILOSEC) 40 MG capsule TAKE ONE CAPSULE BY MOUTH ONCE DAILY FOR  HEARTBURN (Patient taking differently: Take 40 mg by mouth daily. )  . potassium chloride SA (K-DUR,KLOR-CON) 20 MEQ tablet Take 1 tablet (20 mEq total) by mouth daily.  . rosuvastatin (CRESTOR) 20 MG tablet Take 1 tablet (20 mg total) by mouth daily.  . traZODone (DESYREL) 50 MG tablet Take 1 tablet (50 mg total) by mouth at bedtime. (Patient taking differently: Take 50 mg by mouth at bedtime as needed for sleep. )  . triamcinolone cream (KENALOG) 0.1 % Apply 1 application topically 2 (two) times daily.  . modafinil (PROVIGIL) 200 MG tablet TAKE ONE TABLET BY MOUTH TWICE DAILY (Patient taking differently: TAKE 200 MG BY MOUTH TWICE DAILY)  . lisinopril (PRINIVIL,ZESTRIL) 40 MG tablet Take 1 tablet daily for BP & Kidneys (Patient taking differently: Take 40 mg by mouth daily. )   No facility-administered medications prior to visit.   Allergies  Allergen Reactions  . Iodinated Diagnostic Agents Anaphylaxis  . Iohexol Anaphylaxis     Desc: THROAT SWELLED/ PROBLEMS BREATHING WITH IVP DYE 10 PLUS YEARS AGO  Desc: THROAT SWELLED/ PROBLEMS BREATHING WITH IVP DYE 10 PLUS YEARS AGO   . Lipitor [Atorvastatin] Other (See Comments)    Other reaction(s): Myalgias (intolerance)  Myalgias Myalgias   . Septra [Sulfamethoxazole-Trimethoprim] Other (See Comments)    Patient can't remenber  . Tetanus Toxoid Adsorbed Anaphylaxis  . Tetanus Toxoids Anaphylaxis  . Prednisone Other (See Comments)    Insomnia, increased depression  . Seldane [Terfenadine] Other (See Comments)    Other reaction(s): Other (See Comments) Hair loss Hair loss  . Tetanus Toxoid Swelling   Past Medical History  Diagnosis Date  . Dyslipidemia   . Sleep apnea     5 yrs --cpap  does not use   . Heart murmur   . H/O hiatal hernia   . Arthritis   . Hyperlipidemia   . Hypertension   . GERD (gastroesophageal reflux disease)    . Depression   . Diabetes mellitus     diet controlled  . Hypothyroidism    Past Surgical History  Procedure Laterality Date  . Breast tumor      secondary to fibrocystic disese  . Cholecystctomy    . Left hip replacement    . Abdominal hysterectomy    . Cardiac catheterization      normal dr. Fredrich Birks  . Joint replacement      left hip  . Cholecystectomy    . Total shoulder arthroplasty  11/15/2011    Procedure: TOTAL SHOULDER ARTHROPLASTY;  Surgeon: Senaida Lange, MD;  Location: [facility] OR;  Service: Orthopedics;  Laterality: Left;  left total shoulder arthroplasty   Review of Systems  10 point systems review negative except as above.    Objective:   Physical Exam  BP 168/62 mmHg  Pulse 64  Temp(Src) 97 F (36.1 C)  Resp 16  Ht 5' 3.5" (1.613 m)  Wt 169 lb 9.6 oz (76.93 kg)  BMI 29.57 kg/m2  Skin - clear w/o rash cyanosis icterus or clubbing.  HEENT - Atraumatic.  Eac's patent. TM's Nl. EOM's full. PERRLA. NasoOroPharynx clear. Neck - supple. Nl Thyroid. Carotids 2+ & No bruits, nodes, JVD Chest - Clear equal BS w/o Rales, rhonchi, wheezes. Cor - Nl HS. RRR w/o sig MGR. PP 1(+). No edema. Abd - No palpable organomegaly, masses or tenderness. BS nl. MS- FROM w/o deformities. Muscle power, tone and bulk Nl. Gait sl slow. No tremor or cog wheeling.  Neuro - No obvious Cr N abnormalities. Sensory, motor and Cerebellar functions appear Nl w/o focal abnormalities. Psyche - Mental status normal & appropriate.  No delusions, ideations or obvious mood abnormalities.    Assessment & Plan:   1. Essential hypertension   2. Hypokalemia  - BASIC METABOLIC PANEL WITH GFR  3. Frequent falls  - recommend continue LPT.   - advised use her can & walker as advised also by LPT.  4. Medication management  - CBC with Differential/Platelet - BASIC METABOLIC PANEL WITH GFR

## 2015-05-11 NOTE — Patient Instructions (Addendum)
Fall Prevention in the Home  Falls can cause injuries and can affect people from all age groups. There are many simple things that you can do to make your home safe and to help prevent falls. WHAT CAN I DO ON THE OUTSIDE OF MY HOME? 1. Regularly repair the edges of walkways and driveways and fix any cracks. 2. Remove high doorway thresholds. 3. Trim any shrubbery on the main path into your home. 4. Use bright outdoor lighting. 5. Clear walkways of debris and clutter, including tools and rocks. 6. Regularly check that handrails are securely fastened and in good repair. Both sides of any steps should have handrails. 7. Install guardrails along the edges of any raised decks or porches. 8. Have leaves, snow, and ice cleared regularly. 9. Use sand or salt on walkways during winter months. 10. In the garage, clean up any spills right away, including grease or oil spills. WHAT CAN I DO IN THE BATHROOM? 1. Use night lights. 2. Install grab bars by the toilet and in the tub and shower. Do not use towel bars as grab bars. 3. Use non-skid mats or decals on the floor of the tub or shower. 4. If you need to sit down while you are in the shower, use a plastic, non-slip stool.. 5. Keep the floor dry. Immediately clean up any water that spills on the floor. 6. Remove soap buildup in the tub or shower on a regular basis. 7. Attach bath mats securely with double-sided non-slip rug tape. 8. Remove throw rugs and other tripping hazards from the floor. WHAT CAN I DO IN THE BEDROOM? 1. Use night lights. 2. Make sure that a bedside light is easy to reach. 3. Do not use oversized bedding that drapes onto the floor. 4. Have a firm chair that has side arms to use for getting dressed. 5. Remove throw rugs and other tripping hazards from the floor. WHAT CAN I DO IN THE KITCHEN?   Clean up any spills right away.  Avoid walking on wet floors.  Place frequently used items in easy-to-reach places.  If you need  to reach for something above you, use a sturdy step stool that has a grab bar.  Keep electrical cables out of the way.  Do not use floor polish or wax that makes floors slippery. If you have to use wax, make sure that it is non-skid floor wax.  Remove throw rugs and other tripping hazards from the floor. WHAT CAN I DO IN THE STAIRWAYS?  Do not leave any items on the stairs.  Make sure that there are handrails on both sides of the stairs. Fix handrails that are broken or loose. Make sure that handrails are as long as the stairways.  Check any carpeting to make sure that it is firmly attached to the stairs. Fix any carpet that is loose or worn.  Avoid having throw rugs at the top or bottom of stairways, or secure the rugs with carpet tape to prevent them from moving.  Make sure that you have a light switch at the top of the stairs and the bottom of the stairs. If you do not have them, have them installed. WHAT ARE SOME OTHER FALL PREVENTION TIPS?  Wear closed-toe shoes that fit well and support your feet. Wear shoes that have rubber soles or low heels.  When you use a stepladder, make sure that it is completely opened and that the sides are firmly locked. Have someone hold the ladder while you  are using it. Do not climb a closed stepladder.  Add color or contrast paint or tape to grab bars and handrails in your home. Place contrasting color strips on the first and last steps.  Use mobility aids as needed, such as canes, walkers, scooters, and crutches.  Turn on lights if it is dark. Replace any light bulbs that burn out.  Set up furniture so that there are clear paths. Keep the furniture in the same spot.  Fix any uneven floor surfaces.  Choose a carpet design that does not hide the edge of steps of a stairway.  Be aware of any and all pets.  Review your medicines with your healthcare provider. Some medicines can cause dizziness or changes in blood pressure, which increase your  risk of falling. Talk with your health care provider about other ways that you can decrease your risk of falls. This may include working with a physical therapist or trainer to improve your strength, balance, and endurance.   ++++++++++++++++++++++++++++++++++  Preventing Falls and Fractures  Falls can be very serious, especially for older adults or people with osteoporosis  Falls can be caused by:  Tripping or slipping  Slow reflexes  Balance problems  Reduced muscle strength  Poor vision or a recent change in prescription  Illness and some medications (especially blood pressure pills, diuretics, heart medicines, muscle relaxants and sleep medications)  Drinking alcohol  To prevent falls outdoors:  Use a can or walker if needed  Wear rubber-soled shoes so you don't slip  DO NOT buy "shape up" shoes with rocker bottom soles if you have balance problems.  The thick soles and shape make it more difficult to keep your balance.  Put kitty litter or salt on icy sidewalks  Walk on the grass if the sidewalks are slick  Avoid walking on uneven ground whenever possible  T prevent falls indoors:  Keep rooms clutter-free, especially hallways, stairs and paths to light switches  Remove throw rugs  Install night lights, especially to and in the bathroom  Turn on lights before going downstairs  Keep a flashlight next to your bed  Buy a cordless phone to keep with you instead of jumping up to answer the phone  Install grab bars in the bathroom near the shower and toilet  Install rails on both sides of the stairs.  Make sure the stairs are well lit  Wear slippers with non-skid soles.  Do not walk around in stockings or socks  Balance problems and dizziness are not a normal part of growing older.  If you begin having balance problems or dizziness see your doctor.  Physical Therapy can help you with many balance problems, strengthening hip and leg muscles and with gait  training.  To keep your bones healthy make sure you are getting enough calcium and Vitamin D each day.  Ask your doctor or pharmacist about supplements.  Regular weight-bearing exercise like walking, lifting weights or dancing can help strengthen bones and prevent osteoporosis.   It is important to avoid accidents which may result in broken bones.  Here are a few ideas on how to make your home safer so you will be less likely to trip or fall.  8. Use nonskid mats or non slip strips in your shower or tub, on your bathroom floor and around sinks.  If you know that you have spilled water, wipe it up! 9. In the bathroom, it is important to have properly installed grab bars on the walls or  on the edge of the tub.  Towel racks are NOT strong enough for you to hold onto or to pull on for support. 10. Stairs and hallways should have enough light.  Add lamps or night lights if you need ore light. 11. It is good to have handrails on both sides of the stairs if possible.  Always fix broken handrails right away. 12. It is important to see the edges of steps.  Paint the edges of outdoor steps white so you can see them better.  Put colored tape on the edge of inside steps. 13. Throw-rugs are dangerous because they can slide.  Removing the rugs is the best idea, but if they must stay, add adhesive carpet tape to prevent slipping. 14. Do not keep things on stairs or in the halls.  Remove small furniture that blocks the halls as it may cause you to trip.  Keep telephone and electrical cords out of the way where you walk. 15. Always were sturdy, rubber-soled shoes for good support.  Never wear just socks, especially on the stairs.  Socks may cause you to slip or fall.  Do not wear full-length housecoats as you can easily trip on the bottom.  16. Place the things you use the most on the shelves that are the easiest to reach.  If you use a stepstool, make sure it is in good condition.  If you feel unsteady, DO NOT climb,  ask for help. 17. If a health professional advises you to use a cane or walker, do not be ashamed.  These items can keep you from falling and breaking your bones.

## 2015-05-31 ENCOUNTER — Encounter: Payer: Self-pay | Admitting: Internal Medicine

## 2015-05-31 ENCOUNTER — Ambulatory Visit: Payer: Self-pay

## 2015-05-31 ENCOUNTER — Ambulatory Visit (INDEPENDENT_AMBULATORY_CARE_PROVIDER_SITE_OTHER): Payer: Medicare Other | Admitting: Internal Medicine

## 2015-05-31 VITALS — BP 162/68 | HR 62 | Temp 98.2°F | Resp 18 | Ht 63.5 in | Wt 171.0 lb

## 2015-05-31 DIAGNOSIS — Z79899 Other long term (current) drug therapy: Secondary | ICD-10-CM

## 2015-05-31 DIAGNOSIS — E039 Hypothyroidism, unspecified: Secondary | ICD-10-CM | POA: Diagnosis not present

## 2015-05-31 DIAGNOSIS — I2510 Atherosclerotic heart disease of native coronary artery without angina pectoris: Secondary | ICD-10-CM | POA: Diagnosis not present

## 2015-05-31 LAB — BASIC METABOLIC PANEL WITH GFR
BUN: 17 mg/dL (ref 7–25)
CALCIUM: 10.2 mg/dL (ref 8.6–10.4)
CO2: 26 mmol/L (ref 20–31)
Chloride: 103 mmol/L (ref 98–110)
Creat: 0.77 mg/dL (ref 0.60–0.93)
GFR, EST AFRICAN AMERICAN: 87 mL/min (ref >=60)
GFR, EST NON AFRICAN AMERICAN: 75 mL/min (ref >=60)
GLUCOSE: 93 mg/dL (ref 65–99)
Potassium: 4 mmol/L (ref 3.5–5.3)
Sodium: 142 mmol/L (ref 135–146)

## 2015-05-31 LAB — TSH: TSH: 21.765 u[IU]/mL — AB (ref 0.350–4.500)

## 2015-05-31 NOTE — Patient Instructions (Signed)
Please take your thryoid medication (LEVOTHYROXINE) on an empty stomach with a sip of water.  Don't take your other medications with it or eat until you wait at least 30-45 minutes. Please take 1 tablet daily.

## 2015-05-31 NOTE — Progress Notes (Signed)
Patient ID: [name], female   DOB: 04-Jun-1936, 79 y.o.   MRN: [id number]  Assessment and Plan:   1. Medication management -patient requesting supplemental KCl - BASIC METABOLIC PANEL WITH GFR  2. Hypothyroidism, unspecified hypothyroidism type -taking medication wrong.  Instructed on how to take -for now will stay on 175 mcg daily on empty stomach prior to other medications -nurse visit recheck in 1 month - TSH     HPI 79 y.o.female presents for 1 month follow up of thyroid medication.  She reports that she takes a whole tablet MWF and that is it.  She is supposed to be taking 1 tablet MWF and 1.5 tablets T,TH,S,Sun.  Patient reports that they have been doing well.  female is taking their medication.  They are not having difficulty with their medications.  They report no adverse reactions.    Past Medical History  Diagnosis Date  . Dyslipidemia   . Sleep apnea     5 yrs --cpap  does not use   . Heart murmur   . H/O hiatal hernia   . Arthritis   . Hyperlipidemia   . Hypertension   . GERD (gastroesophageal reflux disease)   . Depression   . Diabetes mellitus     diet controlled  . Hypothyroidism      Allergies  Allergen Reactions  . Iodinated Diagnostic Agents Anaphylaxis  . Iohexol Anaphylaxis     Desc: THROAT SWELLED/ PROBLEMS BREATHING WITH IVP DYE 10 PLUS YEARS AGO  Desc: THROAT SWELLED/ PROBLEMS BREATHING WITH IVP DYE 10 PLUS YEARS AGO   . Lipitor [Atorvastatin] Other (See Comments)    Other reaction(s): Myalgias (intolerance) Myalgias Myalgias   . Septra [Sulfamethoxazole-Trimethoprim] Other (See Comments)    Patient can't remenber  . Tetanus Toxoid Adsorbed Anaphylaxis  . Tetanus Toxoids Anaphylaxis  . Prednisone Other (See Comments)    Insomnia, increased depression  . Seldane [Terfenadine] Other (See Comments)    Other reaction(s): Other (See Comments) Hair loss Hair loss  . Tetanus Toxoid Swelling      Current Outpatient Prescriptions  on File Prior to Visit  Medication Sig Dispense Refill  . ALPRAZolam (XANAX) 0.25 MG tablet Take 0.25 mg by mouth daily as needed. Anxiety As needed    . buPROPion (WELLBUTRIN) 100 MG tablet Take 100 mg by mouth 2 (two) times daily.    . Cetirizine HCl (ZYRTEC ALLERGY) 10 MG CAPS Take 1 capsule (10 mg total) by mouth at bedtime. (Patient taking differently: Take 10 mg by mouth daily as needed (allergies). ) 30 capsule 2  . Cholecalciferol (VITAMIN D PO) Take 1 capsule by mouth daily.     Marland Kitchen. escitalopram (LEXAPRO) 10 MG tablet Take 10 mg by mouth 2 (two) times daily.     . hydrochlorothiazide (HYDRODIURIL) 25 MG tablet Take 1 tablet daily for BP & Fluid 90 tablet 1  . HYDROcodone-acetaminophen (NORCO) 10-325 MG per tablet Take 1 tablet by mouth every 6 (six) hours as needed for moderate pain.     Marland Kitchen. lamoTRIgine (LAMICTAL) 100 MG tablet Take 100 mg by mouth at bedtime.    Marland Kitchen. levothyroxine (SYNTHROID, LEVOTHROID) 175 MCG tablet Take 1 tablet (175 mcg total) by mouth daily. 90 tablet 1  . lisinopril (PRINIVIL,ZESTRIL) 40 MG tablet Take 1 tablet daily for BP & Kidneys (Patient taking differently: Take 40 mg by mouth daily. ) 90 tablet 1  . loteprednol (LOTEMAX) 0.5 % ophthalmic suspension Place 1 drop into both eyes daily as needed (eye  irritation).     Marland Kitchen MAGNESIUM PO Take 1 tablet by mouth daily.    . modafinil (PROVIGIL) 200 MG tablet Take 1 tablet (200 mg total) by mouth 2 (two) times daily. 180 tablet 0  . montelukast (SINGULAIR) 10 MG tablet Take 1 tablet (10 mg total) by mouth daily. 30 tablet 2  . olopatadine (PATANOL) 0.1 % ophthalmic solution Place 1 drop into both eyes daily as needed for allergies.     Marland Kitchen omeprazole (PRILOSEC) 40 MG capsule TAKE ONE CAPSULE BY MOUTH ONCE DAILY FOR  HEARTBURN (Patient taking differently: Take 40 mg by mouth daily. ) 30 capsule 3  . potassium chloride SA (K-DUR,KLOR-CON) 20 MEQ tablet Take 1 tablet (20 mEq total) by mouth daily. 3 tablet 0  . rosuvastatin  (CRESTOR) 20 MG tablet Take 1 tablet (20 mg total) by mouth daily. 90 tablet 3  . traZODone (DESYREL) 50 MG tablet Take 1 tablet (50 mg total) by mouth at bedtime. (Patient taking differently: Take 50 mg by mouth at bedtime as needed for sleep. ) 30 tablet 3  . triamcinolone cream (KENALOG) 0.1 % Apply 1 application topically 2 (two) times daily. 45 g 1   No current facility-administered medications on file prior to visit.    ROS: all negative except above.   Physical Exam: Filed Weights   05/31/15 1047  Weight: 171 lb (77.565 kg)   BP 178/66 mmHg  Pulse 62  Temp(Src) 98.2 F (36.8 C)  Resp 18  Ht 5' 3.5" (1.613 m)  Wt 171 lb (77.565 kg)  BMI 29.81 kg/m2 General Appearance: Well developed well nourished, non-toxic appearing in no apparent distress. Eyes: PERRLA, EOMs, conjunctiva w/ no swelling or erythema or discharge Sinuses: No Frontal/maxillary tenderness ENT/Mouth: Ear canals clear without swelling or erythema.  TM's normal bilaterally with no retractions, bulging, or loss of landmarks.   Neck: Supple, thyroid normal, no notable JVD  Respiratory: Respiratory effort normal, Clear breath sounds anteriorly and posteriorly bilaterally without rales, rhonchi, wheezing or stridor. No retractions or accessory muscle usage. Cardio: RRR with no MRGs.   Abdomen: Soft, + BS.  Non tender, no guarding, rebound, hernias, masses.  Musculoskeletal: Full ROM, 5/5 strength, normal gait.  Skin: Warm, dry without rashes  Neuro: Awake and oriented X 3, Cranial nerves intact. Normal muscle tone, no cerebellar symptoms. Sensation intact.  Psych: normal affect, Insight and Judgment appropriate.     Terri Piedra, PA-C 11:13 AM [facility]

## 2015-06-02 DIAGNOSIS — G894 Chronic pain syndrome: Secondary | ICD-10-CM | POA: Diagnosis not present

## 2015-06-02 DIAGNOSIS — M5136 Other intervertebral disc degeneration, lumbar region: Secondary | ICD-10-CM | POA: Diagnosis not present

## 2015-06-07 DIAGNOSIS — M5136 Other intervertebral disc degeneration, lumbar region: Secondary | ICD-10-CM | POA: Diagnosis not present

## 2015-06-08 DIAGNOSIS — F330 Major depressive disorder, recurrent, mild: Secondary | ICD-10-CM | POA: Diagnosis not present

## 2015-06-28 DIAGNOSIS — H43812 Vitreous degeneration, left eye: Secondary | ICD-10-CM | POA: Diagnosis not present

## 2015-06-28 DIAGNOSIS — H35033 Hypertensive retinopathy, bilateral: Secondary | ICD-10-CM | POA: Diagnosis not present

## 2015-06-28 DIAGNOSIS — Z961 Presence of intraocular lens: Secondary | ICD-10-CM | POA: Diagnosis not present

## 2015-06-28 DIAGNOSIS — H35039 Hypertensive retinopathy, unspecified eye: Secondary | ICD-10-CM | POA: Insufficient documentation

## 2015-07-04 ENCOUNTER — Ambulatory Visit: Payer: Self-pay

## 2015-07-19 DIAGNOSIS — H43812 Vitreous degeneration, left eye: Secondary | ICD-10-CM | POA: Diagnosis not present

## 2015-07-19 DIAGNOSIS — H35033 Hypertensive retinopathy, bilateral: Secondary | ICD-10-CM | POA: Diagnosis not present

## 2015-07-19 DIAGNOSIS — Z961 Presence of intraocular lens: Secondary | ICD-10-CM | POA: Diagnosis not present

## 2015-07-20 ENCOUNTER — Other Ambulatory Visit: Payer: Self-pay | Admitting: *Deleted

## 2015-07-20 DIAGNOSIS — I10 Essential (primary) hypertension: Secondary | ICD-10-CM

## 2015-07-20 DIAGNOSIS — E1129 Type 2 diabetes mellitus with other diabetic kidney complication: Secondary | ICD-10-CM

## 2015-07-20 MED ORDER — LISINOPRIL 40 MG PO TABS: ORAL_TABLET | ORAL | Status: DC

## 2015-07-25 ENCOUNTER — Ambulatory Visit: Payer: Self-pay | Admitting: Physician Assistant

## 2015-07-26 ENCOUNTER — Encounter: Payer: Self-pay | Admitting: Internal Medicine

## 2015-07-26 ENCOUNTER — Ambulatory Visit (INDEPENDENT_AMBULATORY_CARE_PROVIDER_SITE_OTHER): Payer: Medicare Other | Admitting: Internal Medicine

## 2015-07-26 VITALS — BP 146/60 | HR 58 | Temp 98.0°F | Resp 16 | Ht 63.5 in | Wt 174.0 lb

## 2015-07-26 DIAGNOSIS — Z79899 Other long term (current) drug therapy: Secondary | ICD-10-CM

## 2015-07-26 DIAGNOSIS — I10 Essential (primary) hypertension: Secondary | ICD-10-CM | POA: Diagnosis not present

## 2015-07-26 DIAGNOSIS — E559 Vitamin D deficiency, unspecified: Secondary | ICD-10-CM | POA: Diagnosis not present

## 2015-07-26 DIAGNOSIS — E669 Obesity, unspecified: Secondary | ICD-10-CM | POA: Diagnosis not present

## 2015-07-26 DIAGNOSIS — E785 Hyperlipidemia, unspecified: Secondary | ICD-10-CM | POA: Diagnosis not present

## 2015-07-26 DIAGNOSIS — E1129 Type 2 diabetes mellitus with other diabetic kidney complication: Secondary | ICD-10-CM | POA: Diagnosis not present

## 2015-07-26 DIAGNOSIS — E039 Hypothyroidism, unspecified: Secondary | ICD-10-CM

## 2015-07-26 DIAGNOSIS — E1121 Type 2 diabetes mellitus with diabetic nephropathy: Secondary | ICD-10-CM | POA: Diagnosis not present

## 2015-07-26 LAB — HEPATIC FUNCTION PANEL
ALBUMIN: 4.2 g/dL (ref 3.6–5.1)
ALT: 10 U/L (ref 6–29)
AST: 12 U/L (ref 10–35)
Alkaline Phosphatase: 60 U/L (ref 33–130)
BILIRUBIN TOTAL: 0.3 mg/dL (ref 0.2–1.2)
Bilirubin, Direct: 0.1 mg/dL (ref <=0.2)
Indirect Bilirubin: 0.2 mg/dL (ref 0.2–1.2)
Total Protein: 6.4 g/dL (ref 6.1–8.1)

## 2015-07-26 LAB — LIPID PANEL
CHOLESTEROL: 145 mg/dL (ref 125–200)
HDL: 42 mg/dL — AB (ref >=46)
LDL Cholesterol: 51 mg/dL (ref <130)
TRIGLYCERIDES: 260 mg/dL — AB (ref <150)
Total CHOL/HDL Ratio: 3.5 Ratio (ref <=5.0)
VLDL: 52 mg/dL — ABNORMAL HIGH (ref <30)

## 2015-07-26 LAB — BASIC METABOLIC PANEL WITH GFR
BUN: 20 mg/dL (ref 7–25)
CHLORIDE: 104 mmol/L (ref 98–110)
CO2: 27 mmol/L (ref 20–31)
CREATININE: 0.78 mg/dL (ref 0.60–0.93)
Calcium: 9.6 mg/dL (ref 8.6–10.4)
GFR, Est African American: 85 mL/min (ref >=60)
GFR, Est Non African American: 74 mL/min (ref >=60)
Glucose, Bld: 108 mg/dL — ABNORMAL HIGH (ref 65–99)
Potassium: 4.2 mmol/L (ref 3.5–5.3)
Sodium: 139 mmol/L (ref 135–146)

## 2015-07-26 MED ORDER — POTASSIUM CHLORIDE CRYS ER 20 MEQ PO TBCR: 20.0000 meq | EXTENDED_RELEASE_TABLET | Freq: Every day | ORAL | Status: AC

## 2015-07-26 NOTE — Patient Instructions (Signed)

## 2015-07-26 NOTE — Progress Notes (Signed)
Patient ID: [name], female   DOB: 1935-08-13, 79 y.o.   MRN: [id number]  Assessment and Plan:  Hypertension:  -Continue medication,  -monitor blood pressure at home.  -Continue DASH diet.   -Reminder to go to the ER if any CP, SOB, nausea, dizziness, severe HA, changes vision/speech, left arm numbness and tingling, and jaw pain.  Cholesterol: -Continue diet and exercise.  -Check cholesterol.   Pre-diabetes: -Continue diet and exercise.  -Check A1C  Vitamin D Def: -check level -continue medications.   Dehydration -potassium tablet -increase water intake   Continue diet and meds as discussed. Further disposition pending results of labs.  HPI 79 y.o. female  presents for 3 month follow up with hypertension, hyperlipidemia, prediabetes and vitamin D.   Her blood pressure has been controlled at home, today their BP is BP: (!) 146/60 mmHg.   She does workout. She denies chest pain, shortness of breath, dizziness.   She is on cholesterol medication and denies myalgias. Her cholesterol is at goal. The cholesterol last visit was:   Lab Results  Component Value Date   CHOL 170 04/26/2015   HDL 39* 04/26/2015   LDLCALC 99 04/26/2015   TRIG 162* 04/26/2015   CHOLHDL 4.4 04/26/2015     She has been working on diet and exercise for prediabetes, and denies foot ulcerations, hyperglycemia, hypoglycemia , increased appetite, nausea, paresthesia of the feet, polydipsia, polyuria, visual disturbances, vomiting and weight loss. Last A1C in the office was:  Lab Results  Component Value Date   HGBA1C 5.8* 04/26/2015    Patient is on Vitamin D supplement.  Lab Results  Component Value Date   VD25OH 38 04/26/2015      Patient reports that she had a dizzy spell today and she felt like she was going to fall over.  She reports that she had this last when her potassium was low.  She reports that she hasn't been eating or drinking much.  She reports that the dizziness is  intermittent and only with movement.    Current Medications:  Current Outpatient Prescriptions on File Prior to Visit  Medication Sig Dispense Refill  . ALPRAZolam (XANAX) 0.25 MG tablet Take 0.25 mg by mouth daily as needed. Anxiety As needed    . buPROPion (WELLBUTRIN) 100 MG tablet Take 100 mg by mouth 2 (two) times daily.    . Cetirizine HCl (ZYRTEC ALLERGY) 10 MG CAPS Take 1 capsule (10 mg total) by mouth at bedtime. (Patient taking differently: Take 10 mg by mouth daily as needed (allergies). ) 30 capsule 2  . Cholecalciferol (VITAMIN D PO) Take 1 capsule by mouth daily.     Marland Kitchen escitalopram (LEXAPRO) 10 MG tablet Take 10 mg by mouth 2 (two) times daily.     . hydrochlorothiazide (HYDRODIURIL) 25 MG tablet Take 1 tablet daily for BP & Fluid 90 tablet 1  . HYDROcodone-acetaminophen (NORCO) 10-325 MG per tablet Take 1 tablet by mouth every 6 (six) hours as needed for moderate pain.     Marland Kitchen lamoTRIgine (LAMICTAL) 100 MG tablet Take 100 mg by mouth at bedtime.    Marland Kitchen levothyroxine (SYNTHROID, LEVOTHROID) 175 MCG tablet Take 1 tablet (175 mcg total) by mouth daily. 90 tablet 1  . lisinopril (PRINIVIL,ZESTRIL) 40 MG tablet Take 1 tablet daily for BP & Kidneys 90 tablet 1  . loteprednol (LOTEMAX) 0.5 % ophthalmic suspension Place 1 drop into both eyes daily as needed (eye irritation).     Marland Kitchen MAGNESIUM PO Take 1  tablet by mouth daily.    . modafinil (PROVIGIL) 200 MG tablet Take 1 tablet (200 mg total) by mouth 2 (two) times daily. 180 tablet 0  . omeprazole (PRILOSEC) 40 MG capsule TAKE ONE CAPSULE BY MOUTH ONCE DAILY FOR  HEARTBURN (Patient taking differently: Take 40 mg by mouth daily. ) 30 capsule 3  . rosuvastatin (CRESTOR) 20 MG tablet Take 1 tablet (20 mg total) by mouth daily. 90 tablet 3  . traZODone (DESYREL) 50 MG tablet Take 1 tablet (50 mg total) by mouth at bedtime. (Patient taking differently: Take 50 mg by mouth at bedtime as needed for sleep. ) 30 tablet 3   No current  facility-administered medications on file prior to visit.    Medical History:  Past Medical History  Diagnosis Date  . Dyslipidemia   . Sleep apnea     5 yrs --cpap  does not use   . Heart murmur   . H/O hiatal hernia   . Arthritis   . Hyperlipidemia   . Hypertension   . GERD (gastroesophageal reflux disease)   . Depression   . Diabetes mellitus     diet controlled  . Hypothyroidism     Allergies:  Allergies  Allergen Reactions  . Iodinated Diagnostic Agents Anaphylaxis  . Iohexol Anaphylaxis     Desc: THROAT SWELLED/ PROBLEMS BREATHING WITH IVP DYE 10 PLUS YEARS AGO  Desc: THROAT SWELLED/ PROBLEMS BREATHING WITH IVP DYE 10 PLUS YEARS AGO   . Lipitor [Atorvastatin] Other (See Comments)    Other reaction(s): Myalgias (intolerance) Myalgias Myalgias   . Septra [Sulfamethoxazole-Trimethoprim] Other (See Comments)    Patient can't remenber  . Tetanus Toxoid Adsorbed Anaphylaxis  . Tetanus Toxoids Anaphylaxis  . Prednisone Other (See Comments)    Insomnia, increased depression  . Seldane [Terfenadine] Other (See Comments)    Other reaction(s): Other (See Comments) Hair loss Hair loss  . Tetanus Toxoid Swelling     Review of Systems:  Review of Systems  Constitutional: Negative for fever, chills and malaise/fatigue.  HENT: Negative for congestion, ear pain and sore throat.   Eyes: Negative.   Respiratory: Negative for cough, shortness of breath and wheezing.   Cardiovascular: Negative for chest pain, palpitations and leg swelling.  Gastrointestinal: Negative for heartburn, diarrhea, constipation, blood in stool and melena.  Genitourinary: Negative.   Neurological: Positive for dizziness. Negative for sensory change, loss of consciousness and headaches.  Psychiatric/Behavioral: Negative for depression. The patient is not nervous/anxious and does not have insomnia.     Family history- Review and unchanged  Social history- Review and unchanged  Physical  Exam: BP 146/60 mmHg  Pulse 58  Temp(Src) 98 F (36.7 C) (Temporal)  Resp 16  Ht 5' 3.5" (1.613 m)  Wt 174 lb (78.926 kg)  BMI 30.34 kg/m2 Wt Readings from Last 3 Encounters:  07/26/15 174 lb (78.926 kg)  05/31/15 171 lb (77.565 kg)  05/11/15 169 lb 9.6 oz (76.93 kg)    General Appearance: Well nourished well developed, in no apparent distress. Eyes: PERRLA, EOMs, conjunctiva no swelling or erythema ENT/Mouth: Ear canals normal without obstruction, swelling, erythma, discharge.  TMs normal bilaterally.  Oropharynx dry, clear, without exudate, or postoropharyngeal swelling. Dentures in place Neck: Supple, thyroid normal,no cervical adenopathy  Respiratory: Respiratory effort normal, Breath sounds clear A&P without rhonchi, wheeze, or rale.  No retractions, no accessory usage. Cardio: RRR with no MRGs. Brisk peripheral pulses without edema.  Abdomen: Soft, + BS,  Non tender, no guarding, rebound,  hernias, masses. Musculoskeletal: Full ROM, 5/5 strength, Normal gait Skin: Warm, dry without rashes, lesions, ecchymosis.  Neuro: Awake and oriented X 3, Cranial nerves intact. Normal muscle tone, no cerebellar symptoms. Psych: Normal affect, Insight and Judgment appropriate.    Terri Piedra, PA-C 5:09 PM [facility]

## 2015-07-27 LAB — CBC WITH DIFFERENTIAL/PLATELET
Basophils Absolute: 0 10*3/uL (ref 0.0–0.1)
Basophils Relative: 0 % (ref 0–1)
EOS ABS: 0.2 10*3/uL (ref 0.0–0.7)
EOS PCT: 3 % (ref 0–5)
HCT: 38.5 % (ref 36.0–46.0)
Hemoglobin: 12.7 g/dL (ref 12.0–15.0)
LYMPHS ABS: 1.9 10*3/uL (ref 0.7–4.0)
Lymphocytes Relative: 33 % (ref 12–46)
MCH: 30.9 pg (ref 26.0–34.0)
MCHC: 33 g/dL (ref 30.0–36.0)
MCV: 93.7 fL (ref 78.0–100.0)
MONO ABS: 0.4 10*3/uL (ref 0.1–1.0)
MONOS PCT: 7 % (ref 3–12)
MPV: 9 fL (ref 8.6–12.4)
Neutro Abs: 3.3 10*3/uL (ref 1.7–7.7)
Neutrophils Relative %: 57 % (ref 43–77)
PLATELETS: 222 10*3/uL (ref 150–400)
RBC: 4.11 MIL/uL (ref 3.87–5.11)
RDW: 13.9 % (ref 11.5–15.5)
WBC: 5.8 10*3/uL (ref 4.0–10.5)

## 2015-07-27 LAB — TSH: TSH: 2.252 u[IU]/mL (ref 0.350–4.500)

## 2015-07-27 LAB — HEMOGLOBIN A1C
Hgb A1c MFr Bld: 5.7 % — ABNORMAL HIGH (ref <5.7)
Mean Plasma Glucose: 117 mg/dL — ABNORMAL HIGH (ref <117)

## 2015-07-28 ENCOUNTER — Ambulatory Visit: Payer: Medicare Other | Admitting: Physician Assistant

## 2015-08-17 DIAGNOSIS — M1711 Unilateral primary osteoarthritis, right knee: Secondary | ICD-10-CM | POA: Diagnosis not present

## 2015-08-20 ENCOUNTER — Other Ambulatory Visit: Payer: Self-pay | Admitting: Internal Medicine

## 2015-08-23 DIAGNOSIS — M1711 Unilateral primary osteoarthritis, right knee: Secondary | ICD-10-CM | POA: Diagnosis not present

## 2015-08-31 DIAGNOSIS — M1711 Unilateral primary osteoarthritis, right knee: Secondary | ICD-10-CM | POA: Diagnosis not present

## 2015-09-03 ENCOUNTER — Other Ambulatory Visit: Payer: Self-pay | Admitting: Internal Medicine

## 2015-10-25 ENCOUNTER — Other Ambulatory Visit: Payer: Self-pay | Admitting: *Deleted

## 2015-10-25 DIAGNOSIS — I10 Essential (primary) hypertension: Secondary | ICD-10-CM

## 2015-10-25 DIAGNOSIS — E1129 Type 2 diabetes mellitus with other diabetic kidney complication: Secondary | ICD-10-CM

## 2015-10-25 MED ORDER — LISINOPRIL 40 MG PO TABS: ORAL_TABLET | ORAL | Status: AC

## 2015-11-01 ENCOUNTER — Ambulatory Visit: Payer: Self-pay | Admitting: Internal Medicine

## 2015-11-19 NOTE — Progress Notes (Signed)
RESCHEDULED

## 2015-11-22 ENCOUNTER — Ambulatory Visit: Payer: Self-pay | Admitting: Internal Medicine

## 2015-12-13 ENCOUNTER — Encounter: Payer: Self-pay | Admitting: Internal Medicine

## 2015-12-13 ENCOUNTER — Ambulatory Visit (INDEPENDENT_AMBULATORY_CARE_PROVIDER_SITE_OTHER): Payer: Medicare Other | Admitting: Internal Medicine

## 2015-12-13 VITALS — BP 146/62 | HR 56 | Temp 97.3°F | Resp 16 | Ht 63.5 in | Wt 188.4 lb

## 2015-12-13 DIAGNOSIS — E559 Vitamin D deficiency, unspecified: Secondary | ICD-10-CM

## 2015-12-13 DIAGNOSIS — E1122 Type 2 diabetes mellitus with diabetic chronic kidney disease: Secondary | ICD-10-CM

## 2015-12-13 DIAGNOSIS — E782 Mixed hyperlipidemia: Secondary | ICD-10-CM | POA: Diagnosis not present

## 2015-12-13 DIAGNOSIS — I10 Essential (primary) hypertension: Secondary | ICD-10-CM

## 2015-12-13 DIAGNOSIS — E1129 Type 2 diabetes mellitus with other diabetic kidney complication: Secondary | ICD-10-CM | POA: Diagnosis not present

## 2015-12-13 DIAGNOSIS — Z79899 Other long term (current) drug therapy: Secondary | ICD-10-CM

## 2015-12-13 DIAGNOSIS — M5136 Other intervertebral disc degeneration, lumbar region: Secondary | ICD-10-CM | POA: Diagnosis not present

## 2015-12-13 DIAGNOSIS — F330 Major depressive disorder, recurrent, mild: Secondary | ICD-10-CM | POA: Diagnosis not present

## 2015-12-13 DIAGNOSIS — N182 Chronic kidney disease, stage 2 (mild): Secondary | ICD-10-CM | POA: Diagnosis not present

## 2015-12-13 DIAGNOSIS — E039 Hypothyroidism, unspecified: Secondary | ICD-10-CM

## 2015-12-13 DIAGNOSIS — M1711 Unilateral primary osteoarthritis, right knee: Secondary | ICD-10-CM | POA: Diagnosis not present

## 2015-12-13 DIAGNOSIS — G894 Chronic pain syndrome: Secondary | ICD-10-CM | POA: Diagnosis not present

## 2015-12-13 LAB — CBC WITH DIFFERENTIAL/PLATELET
BASOS ABS: 62 {cells}/uL (ref 0–200)
Basophils Relative: 1 %
EOS PCT: 3 %
Eosinophils Absolute: 186 cells/uL (ref 15–500)
HEMATOCRIT: 35.9 % (ref 35.0–45.0)
HEMOGLOBIN: 12 g/dL (ref 11.7–15.5)
LYMPHS ABS: 1612 {cells}/uL (ref 850–3900)
Lymphocytes Relative: 26 %
MCH: 31.7 pg (ref 27.0–33.0)
MCHC: 33.4 g/dL (ref 32.0–36.0)
MCV: 95 fL (ref 80.0–100.0)
MONO ABS: 372 {cells}/uL (ref 200–950)
MPV: 8.9 fL (ref 7.5–12.5)
Monocytes Relative: 6 %
NEUTROS ABS: 3968 {cells}/uL (ref 1500–7800)
NEUTROS PCT: 64 %
Platelets: 197 10*3/uL (ref 140–400)
RBC: 3.78 MIL/uL — ABNORMAL LOW (ref 3.80–5.10)
RDW: 14.3 % (ref 11.0–15.0)
WBC: 6.2 10*3/uL (ref 3.8–10.8)

## 2015-12-13 NOTE — Patient Instructions (Signed)

## 2015-12-13 NOTE — Progress Notes (Signed)
Patient ID: [name], female   DOB: [birth year], 80 y.o.   MRN: [id number]  [facility]                       Lucky Cowboy, M.D.        Dyanne Carrel. Steffanie Dunn, P.A.-C       Terri Piedra, P.A.-C   [facility]                [address]                [place], [place]. [address] Telephone [phone] Telefax [phone] _________________________________________________________________________   This very nice 80 y.o. WWF presents for  follow up with Hypertension, Hyperlipidemia, Pre-Diabetes and Vitamin D Deficiency. Note patient returns to [facility] relating that she has recently moved to [place] to be closer to her daughter.    Patient is treated for HTN circa 1990's & BP has been controlled at home. Today's BP is 146/62. Patient has had no complaints of any cardiac type chest pain, palpitations, dyspnea/orthopnea/PND, dizziness, claudication, or dependent edema.   Hyperlipidemia is controlled with diet & meds. Patient denies myalgias or other med SE's. Last Lipids were at goal with Cholesterol 145; HDL 42*; LDL 51; but with elevated Triglycerides 260 on 07/26/2015.   Also, the patient has history of Morbid Obesity (BMI 32+) and consequent PreDiabetes and has had no symptoms of reactive hypoglycemia, diabetic polys, paresthesias or visual blurring.  Last A1c was 5.7% on 07/26/2015.   Further, the patient also has history of Vitamin D Deficiency and supplements vitamin D without any suspected side-effects. Last vitamin D was still low at 38 on 04/26/2015.  Medication Sig  . ALPRAZolam  0.25 MG tablet Take 0.25 mg by mouth daily as needed. Anxiety As needed  . buPROPion  100 MG tablet Take 100 mg by mouth 2 (two) times daily.  . cetirizine  10 MG tablet TAKE ONE TABLET BY MOUTH AT BEDTIME  . VITAMIN D  Take 1 capsule by mouth daily.   Marland Kitchen escitalopram 10 MG tablet Take 10 mg by mouth 2 (two) times daily.   .  hydrochlorothiazide  25 MG  TAKE ONE TABLET BY MOUTH ONCE DAILY FOR BLOOD PRESSURE & FLUID  . NORCO 10-325  Take 1 tablet by mouth every 6 (six) hours as needed for moderate pain.   Marland Kitchen lamoTRIgine 100 MG tablet Take 100 mg by mouth at bedtime.  Marland Kitchen levothyroxine 175 MCG tablet Take 1 tablet (175 mcg total) by mouth daily.  Marland Kitchen lisinopril 40 MG tablet Take 1 tablet daily for BP & Kidneys  . LOTEMAX 0.5 % ophth susp Place 1 drop into both eyes daily as needed (eye irritation).   Marland Kitchen MAGNESIUM PO Take 1 tablet by mouth daily.  . modafinil (PROVIGIL) 200 MG  Take 1 tablet (200 mg total) by mouth 2 (two) times daily.  Marland Kitchen omeprazole40 MG capsule TAKE ONE CAPSULE BY MOUTH ONCE DAILY FOR  HEARTBURN  . potassium chloride  20 MEQ  Take 1 tablet (20 mEq total) by mouth daily.  . rosuvastatin  20 MG  Take 1 tablet (20 mg total) by mouth daily.  . traZODone  50 MG tablet Take 1 tablet (50 mg total) by mouth at bedtime.    Allergies  Allergen Reactions  . Iodinated Diagnostic Agents Anaphylaxis  . Iohexol Anaphylaxis  . Lipitor [Atorvastatin] Myalgias      .  Septra [Sulfamethoxazole-Trimethoprim] Other (See Comments)  . Tetanus Toxoid Adsorbed Anaphylaxis  . Tetanus Toxoids Anaphylaxis  . Prednisone Other (See Comments)    Insomnia, increased depression  . Seldane [Terfenadine] Other (See Comments)Hair loss  . Tetanus Toxoid Swelling   PMHx:   Past Medical History  Diagnosis Date  . Dyslipidemia   . Sleep apnea     5 yrs --cpap  does not use   . Heart murmur   . H/O hiatal hernia   . Arthritis   . Hyperlipidemia   . Hypertension   . GERD (gastroesophageal reflux disease)   . Depression   . Diabetes mellitus     diet controlled  . Hypothyroidism    Immunization History  Administered Date(s) Administered  . Influenza, High Dose Seasonal PF 04/26/2015  . Pneumococcal Conjugate-13 11/04/2013  . Pneumococcal Polysaccharide-23 08/06/1998  . Zoster 12/19/2014   Past Surgical History  Procedure  Laterality Date  . Breast tumor      secondary to fibrocystic disese  . Cholecystctomy    . Left hip replacement    . Abdominal hysterectomy    . Cardiac catheterization      normal dr. Fredrich Birkssoloman  . Joint replacement      left hip  . Cholecystectomy    . Total shoulder arthroplasty  11/15/2011    Procedure: Left SHOULDER ARTHROPLASTY;  Surgeon: Senaida LangeKevin M Supple, MD   FHx:    Reviewed / unchanged  SHx:    Reviewed / unchanged  Systems Review:  Constitutional: Denies fever, chills, wt changes, headaches, insomnia, fatigue, night sweats, change in appetite. Eyes: Denies redness, blurred vision, diplopia, discharge, itchy, watery eyes.  ENT: Denies discharge, congestion, post nasal drip, epistaxis, sore throat, earache, hearing loss, dental pain, tinnitus, vertigo, sinus pain, snoring.  CV: Denies chest pain, palpitations, irregular heartbeat, syncope, dyspnea, diaphoresis, orthopnea, PND, claudication or edema. Respiratory: denies cough, dyspnea, DOE, pleurisy, hoarseness, laryngitis, wheezing.  Gastrointestinal: Denies dysphagia, odynophagia, heartburn, reflux, water brash, abdominal pain or cramps, nausea, vomiting, bloating, diarrhea, constipation, hematemesis, melena, hematochezia  or hemorrhoids. Genitourinary: Denies dysuria, frequency, urgency, nocturia, hesitancy, discharge, hematuria or flank pain. Musculoskeletal: Denies arthralgias, myalgias, stiffness, jt. swelling, pain, limping or strain/sprain.  Skin: Denies pruritus, rash, hives, warts, acne, eczema or change in skin lesion(s). Neuro: No weakness, tremor, incoordination, spasms, paresthesia or pain. Psychiatric: Denies confusion, memory loss or sensory loss. Endo: Denies change in weight, skin or hair change.  Heme/Lymph: No excessive bleeding, bruising or enlarged lymph nodes.  Physical Exam  BP 146/62 mmHg  Pulse 56  Temp(Src) 97.3 F (36.3 C)  Resp 16  Ht 5' 3.5" (1.613 m)  Wt 188 lb 6.4 oz (85.458 kg)  BMI 32.85  kg/m2  Appears over nourished and in no distress.  Eyes: PERRLA, EOMs, conjunctiva no swelling or erythema. Sinuses: No frontal/maxillary tenderness ENT/Mouth: EAC's clear, TM's nl w/o erythema, bulging. Nares clear w/o erythema, swelling, exudates. Oropharynx clear without erythema or exudates. Oral hygiene is good. Tongue normal, non obstructing. Hearing intact.  Neck: Supple. Thyroid nl. Car 2+/2+ without bruits, nodes or JVD. Chest: Respirations nl with BS clear & equal w/o rales, rhonchi, wheezing or stridor.  Cor: Heart sounds normal w/ regular rate and rhythm without sig. murmurs, gallops, clicks, or rubs. Peripheral pulses normal and equal  without edema.  Abdomen: Soft & bowel sounds normal. Non-tender w/o guarding, rebound, hernias, masses, or organomegaly.  Lymphatics: Unremarkable.  Musculoskeletal: Full ROM all peripheral extremities, joint stability, 5/5 strength, and normal gait.  Skin: Warm, dry without exposed rashes, lesions or ecchymosis apparent.  Neuro: Cranial nerves intact, reflexes equal bilaterally. Sensory-motor testing grossly intact. Tendon reflexes grossly intact.  Pysch: Alert & oriented x 3.  Insight and judgement nl & appropriate. No ideations.  Assessment and Plan:  1. Essential hypertension  - TSH  2. Mixed hyperlipidemia  - Lipid panel - TSH  3. Type 2 diabetes mellitus with stage 2 chronic kidney disease, without long-term current use of insulin (HCC)  - Hemoglobin A1c - Insulin, random  4. Vitamin D deficiency  - VITAMIN D 25 Hydroxy   5. Hypothyroidism   6. Medication management  - CBC with Differential/Platelet - BASIC METABOLIC PANEL WITH GFR - Hepatic function panel - Magnesium   Recommended regular exercise, BP monitoring, weight control, and discussed med and SE's. Recommended labs to assess and monitor clinical status. Further disposition pending results of labs. Over 30 minutes of exam, counseling, chart review was  performed

## 2015-12-14 LAB — HEPATIC FUNCTION PANEL
ALK PHOS: 59 U/L (ref 33–130)
ALT: 12 U/L (ref 6–29)
AST: 13 U/L (ref 10–35)
Albumin: 4 g/dL (ref 3.6–5.1)
BILIRUBIN TOTAL: 0.3 mg/dL (ref 0.2–1.2)
Bilirubin, Direct: 0.1 mg/dL (ref <=0.2)
Indirect Bilirubin: 0.2 mg/dL (ref 0.2–1.2)
Total Protein: 6.1 g/dL (ref 6.1–8.1)

## 2015-12-14 LAB — VITAMIN D 25 HYDROXY (VIT D DEFICIENCY, FRACTURES): Vit D, 25-Hydroxy: 36 ng/mL (ref 30–100)

## 2015-12-14 LAB — LIPID PANEL
CHOL/HDL RATIO: 4.5 ratio (ref <=5.0)
Cholesterol: 183 mg/dL (ref 125–200)
HDL: 41 mg/dL — AB (ref >=46)
LDL CALC: 93 mg/dL (ref <130)
Triglycerides: 247 mg/dL — ABNORMAL HIGH (ref <150)
VLDL: 49 mg/dL — ABNORMAL HIGH (ref <30)

## 2015-12-14 LAB — BASIC METABOLIC PANEL WITH GFR
BUN: 14 mg/dL (ref 7–25)
CHLORIDE: 107 mmol/L (ref 98–110)
CO2: 27 mmol/L (ref 20–31)
CREATININE: 0.94 mg/dL — AB (ref 0.60–0.93)
Calcium: 9.3 mg/dL (ref 8.6–10.4)
GFR, EST NON AFRICAN AMERICAN: 59 mL/min — AB (ref >=60)
GFR, Est African American: 68 mL/min (ref >=60)
GLUCOSE: 122 mg/dL — AB (ref 65–99)
Potassium: 3.8 mmol/L (ref 3.5–5.3)
SODIUM: 142 mmol/L (ref 135–146)

## 2015-12-14 LAB — INSULIN, RANDOM: Insulin: 126.4 u[IU]/mL — ABNORMAL HIGH (ref 2.0–19.6)

## 2015-12-14 LAB — TSH: TSH: 11.47 m[IU]/L — AB

## 2015-12-14 LAB — HEMOGLOBIN A1C
HEMOGLOBIN A1C: 5.7 % — AB (ref <5.7)
Mean Plasma Glucose: 117 mg/dL

## 2015-12-14 LAB — MAGNESIUM: Magnesium: 1.6 mg/dL (ref 1.5–2.5)

## 2016-01-09 DIAGNOSIS — I10 Essential (primary) hypertension: Secondary | ICD-10-CM | POA: Diagnosis not present

## 2016-01-09 DIAGNOSIS — G8929 Other chronic pain: Secondary | ICD-10-CM | POA: Diagnosis not present

## 2016-01-09 DIAGNOSIS — G894 Chronic pain syndrome: Secondary | ICD-10-CM | POA: Diagnosis not present

## 2016-01-09 DIAGNOSIS — E559 Vitamin D deficiency, unspecified: Secondary | ICD-10-CM | POA: Diagnosis not present

## 2016-01-09 DIAGNOSIS — G4719 Other hypersomnia: Secondary | ICD-10-CM | POA: Diagnosis not present

## 2016-01-09 DIAGNOSIS — Z7689 Persons encountering health services in other specified circumstances: Secondary | ICD-10-CM | POA: Diagnosis not present

## 2016-01-09 DIAGNOSIS — M25561 Pain in right knee: Secondary | ICD-10-CM | POA: Diagnosis not present

## 2016-01-09 DIAGNOSIS — E039 Hypothyroidism, unspecified: Secondary | ICD-10-CM | POA: Diagnosis not present

## 2016-01-12 DIAGNOSIS — H43813 Vitreous degeneration, bilateral: Secondary | ICD-10-CM | POA: Diagnosis not present

## 2016-01-25 DIAGNOSIS — Z96612 Presence of left artificial shoulder joint: Secondary | ICD-10-CM | POA: Diagnosis not present

## 2016-01-25 DIAGNOSIS — M170 Bilateral primary osteoarthritis of knee: Secondary | ICD-10-CM | POA: Diagnosis not present

## 2016-01-25 DIAGNOSIS — Z96642 Presence of left artificial hip joint: Secondary | ICD-10-CM | POA: Diagnosis not present

## 2016-01-25 DIAGNOSIS — M159 Polyosteoarthritis, unspecified: Secondary | ICD-10-CM | POA: Diagnosis not present

## 2016-01-25 DIAGNOSIS — G894 Chronic pain syndrome: Secondary | ICD-10-CM | POA: Diagnosis not present

## 2016-02-20 DIAGNOSIS — M25561 Pain in right knee: Secondary | ICD-10-CM | POA: Diagnosis not present

## 2016-02-20 DIAGNOSIS — M1711 Unilateral primary osteoarthritis, right knee: Secondary | ICD-10-CM | POA: Diagnosis not present

## 2016-03-13 DIAGNOSIS — M1711 Unilateral primary osteoarthritis, right knee: Secondary | ICD-10-CM | POA: Diagnosis not present

## 2016-04-05 DIAGNOSIS — Z79899 Other long term (current) drug therapy: Secondary | ICD-10-CM | POA: Diagnosis not present

## 2016-04-13 DIAGNOSIS — I10 Essential (primary) hypertension: Secondary | ICD-10-CM | POA: Diagnosis not present

## 2016-04-13 DIAGNOSIS — K219 Gastro-esophageal reflux disease without esophagitis: Secondary | ICD-10-CM | POA: Diagnosis not present

## 2016-04-13 DIAGNOSIS — G894 Chronic pain syndrome: Secondary | ICD-10-CM | POA: Diagnosis not present

## 2016-04-13 DIAGNOSIS — E039 Hypothyroidism, unspecified: Secondary | ICD-10-CM | POA: Diagnosis not present

## 2016-04-13 DIAGNOSIS — G4719 Other hypersomnia: Secondary | ICD-10-CM | POA: Diagnosis not present

## 2016-04-24 DIAGNOSIS — Z96642 Presence of left artificial hip joint: Secondary | ICD-10-CM | POA: Diagnosis not present

## 2016-04-24 DIAGNOSIS — M159 Polyosteoarthritis, unspecified: Secondary | ICD-10-CM | POA: Diagnosis not present

## 2016-04-24 DIAGNOSIS — M170 Bilateral primary osteoarthritis of knee: Secondary | ICD-10-CM | POA: Diagnosis not present

## 2016-04-24 DIAGNOSIS — G894 Chronic pain syndrome: Secondary | ICD-10-CM | POA: Diagnosis not present

## 2016-04-24 DIAGNOSIS — Z96612 Presence of left artificial shoulder joint: Secondary | ICD-10-CM | POA: Diagnosis not present

## 2016-04-26 ENCOUNTER — Encounter: Payer: Self-pay | Admitting: Physician Assistant

## 2016-05-29 DIAGNOSIS — M79671 Pain in right foot: Secondary | ICD-10-CM | POA: Diagnosis not present

## 2016-06-01 DIAGNOSIS — G4719 Other hypersomnia: Secondary | ICD-10-CM | POA: Diagnosis not present

## 2016-06-01 DIAGNOSIS — E039 Hypothyroidism, unspecified: Secondary | ICD-10-CM | POA: Diagnosis not present

## 2016-06-01 DIAGNOSIS — G894 Chronic pain syndrome: Secondary | ICD-10-CM | POA: Diagnosis not present

## 2016-06-01 DIAGNOSIS — I10 Essential (primary) hypertension: Secondary | ICD-10-CM | POA: Diagnosis not present

## 2016-06-01 DIAGNOSIS — Z1231 Encounter for screening mammogram for malignant neoplasm of breast: Secondary | ICD-10-CM | POA: Diagnosis not present

## 2016-06-01 DIAGNOSIS — E785 Hyperlipidemia, unspecified: Secondary | ICD-10-CM | POA: Diagnosis not present

## 2016-06-25 DIAGNOSIS — M79671 Pain in right foot: Secondary | ICD-10-CM | POA: Diagnosis not present

## 2016-07-11 DIAGNOSIS — E559 Vitamin D deficiency, unspecified: Secondary | ICD-10-CM | POA: Diagnosis not present

## 2016-07-11 DIAGNOSIS — F329 Major depressive disorder, single episode, unspecified: Secondary | ICD-10-CM | POA: Diagnosis not present

## 2016-07-11 DIAGNOSIS — I10 Essential (primary) hypertension: Secondary | ICD-10-CM | POA: Diagnosis not present

## 2016-07-11 DIAGNOSIS — G4719 Other hypersomnia: Secondary | ICD-10-CM | POA: Diagnosis not present

## 2016-07-11 DIAGNOSIS — E039 Hypothyroidism, unspecified: Secondary | ICD-10-CM | POA: Diagnosis not present

## 2016-07-11 DIAGNOSIS — Z0000 Encounter for general adult medical examination without abnormal findings: Secondary | ICD-10-CM | POA: Diagnosis not present

## 2016-07-11 DIAGNOSIS — K219 Gastro-esophageal reflux disease without esophagitis: Secondary | ICD-10-CM | POA: Diagnosis not present

## 2016-07-11 DIAGNOSIS — E785 Hyperlipidemia, unspecified: Secondary | ICD-10-CM | POA: Diagnosis not present

## 2016-07-16 ENCOUNTER — Telehealth: Payer: Self-pay

## 2016-07-16 NOTE — Telephone Encounter (Signed)
Cologuard order has expired due to being in an inactive order status and has exceeded 365 days from the initial order

## 2016-07-24 DIAGNOSIS — M159 Polyosteoarthritis, unspecified: Secondary | ICD-10-CM | POA: Diagnosis not present

## 2016-07-24 DIAGNOSIS — Z96642 Presence of left artificial hip joint: Secondary | ICD-10-CM | POA: Diagnosis not present

## 2016-07-24 DIAGNOSIS — M170 Bilateral primary osteoarthritis of knee: Secondary | ICD-10-CM | POA: Diagnosis not present

## 2016-07-24 DIAGNOSIS — Z96612 Presence of left artificial shoulder joint: Secondary | ICD-10-CM | POA: Diagnosis not present

## 2016-07-24 DIAGNOSIS — G894 Chronic pain syndrome: Secondary | ICD-10-CM | POA: Diagnosis not present

## 2016-09-04 DIAGNOSIS — Z471 Aftercare following joint replacement surgery: Secondary | ICD-10-CM | POA: Diagnosis not present

## 2016-09-04 DIAGNOSIS — M5432 Sciatica, left side: Secondary | ICD-10-CM | POA: Diagnosis not present

## 2016-09-04 DIAGNOSIS — Z96642 Presence of left artificial hip joint: Secondary | ICD-10-CM | POA: Diagnosis not present

## 2016-09-18 DIAGNOSIS — I739 Peripheral vascular disease, unspecified: Secondary | ICD-10-CM | POA: Diagnosis not present

## 2016-09-18 DIAGNOSIS — M48061 Spinal stenosis, lumbar region without neurogenic claudication: Secondary | ICD-10-CM | POA: Diagnosis not present

## 2016-09-18 DIAGNOSIS — Z96642 Presence of left artificial hip joint: Secondary | ICD-10-CM | POA: Diagnosis not present

## 2016-09-18 DIAGNOSIS — Z471 Aftercare following joint replacement surgery: Secondary | ICD-10-CM | POA: Diagnosis not present

## 2016-09-18 DIAGNOSIS — Z79899 Other long term (current) drug therapy: Secondary | ICD-10-CM | POA: Diagnosis not present

## 2016-09-26 DIAGNOSIS — F339 Major depressive disorder, recurrent, unspecified: Secondary | ICD-10-CM | POA: Diagnosis not present

## 2016-09-26 DIAGNOSIS — Z6831 Body mass index (BMI) 31.0-31.9, adult: Secondary | ICD-10-CM | POA: Diagnosis not present

## 2016-10-01 DIAGNOSIS — Z6831 Body mass index (BMI) 31.0-31.9, adult: Secondary | ICD-10-CM | POA: Diagnosis not present

## 2016-10-01 DIAGNOSIS — I70213 Atherosclerosis of native arteries of extremities with intermittent claudication, bilateral legs: Secondary | ICD-10-CM | POA: Diagnosis not present

## 2016-10-04 DIAGNOSIS — Z6832 Body mass index (BMI) 32.0-32.9, adult: Secondary | ICD-10-CM | POA: Diagnosis not present

## 2016-10-04 DIAGNOSIS — F339 Major depressive disorder, recurrent, unspecified: Secondary | ICD-10-CM | POA: Diagnosis not present

## 2016-10-09 DIAGNOSIS — Z6831 Body mass index (BMI) 31.0-31.9, adult: Secondary | ICD-10-CM | POA: Diagnosis not present

## 2016-10-09 DIAGNOSIS — F419 Anxiety disorder, unspecified: Secondary | ICD-10-CM | POA: Diagnosis not present

## 2016-10-09 DIAGNOSIS — F339 Major depressive disorder, recurrent, unspecified: Secondary | ICD-10-CM | POA: Diagnosis not present

## 2016-10-10 DIAGNOSIS — I70213 Atherosclerosis of native arteries of extremities with intermittent claudication, bilateral legs: Secondary | ICD-10-CM | POA: Diagnosis not present

## 2016-10-16 IMAGING — MR MR SHOULDER*L* W/O CM
4 of 8 series · 19 of 40 positions shown · non-contrast
Comparison: None.

CLINICAL DATA: Evaluate mass on the top of the patient's left
shoulder. Getting larger over the past year. History of shoulder
replacement surgery in 3569.

EXAM:
MRI OF THE LEFT SHOULDER WITHOUT CONTRAST
TECHNIQUE: Multiplanar, multisequence MR imaging of the shoulder was performed.
No intravenous contrast was administered.

[Series 5: (id) · axial · 4.0mm · 0.31mm/px · z∈[-63,+16]mm · 4 of 21 slices shown]
[im 1/21]
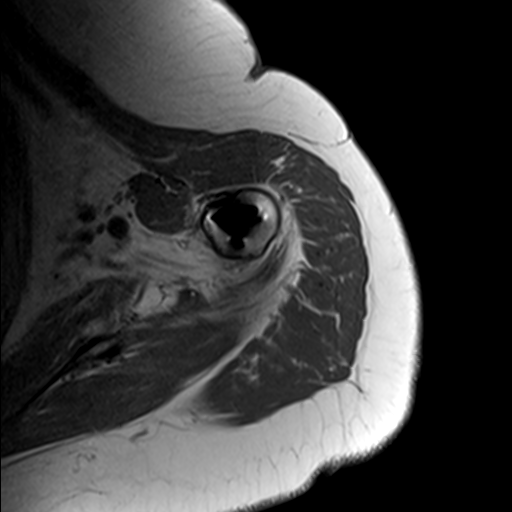
[im 6/21]
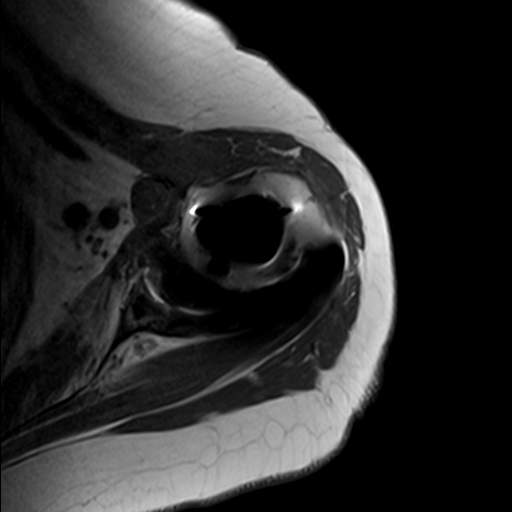
[im 11/21]
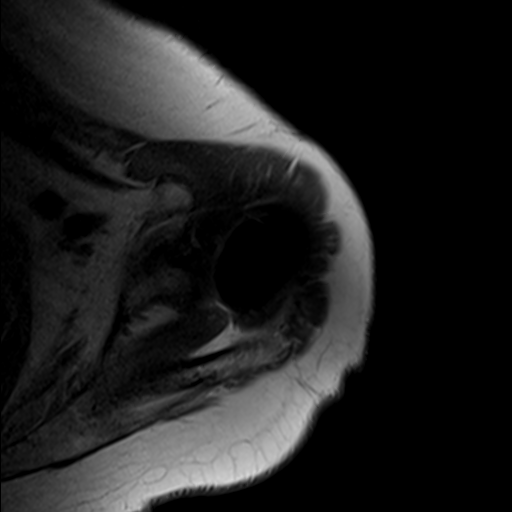
[im 21/21]
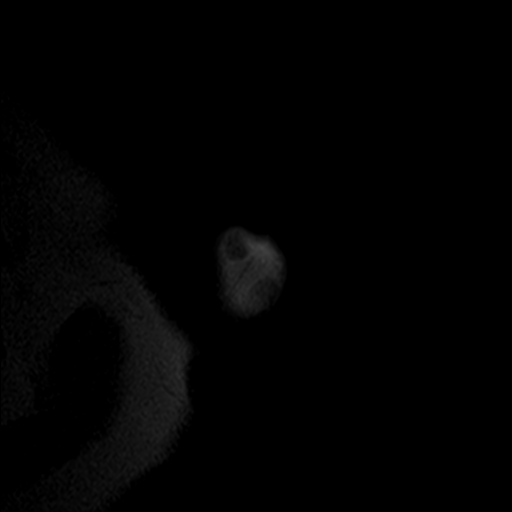

[Series 6: T2 fat-sat · axial · 4.0mm · 0.31mm/px · z∈[-63,+16]mm · 5 of 21 slices shown (1 of 2)]
[im 1/21]
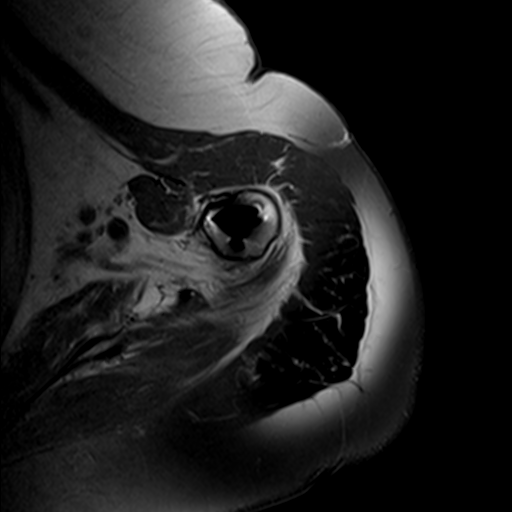
[im 6/21]
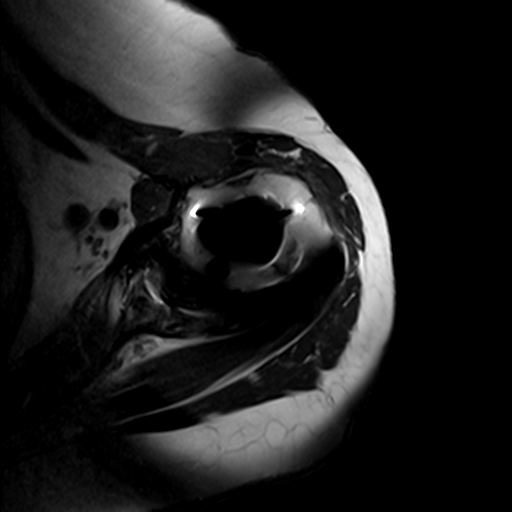
[im 11/21]
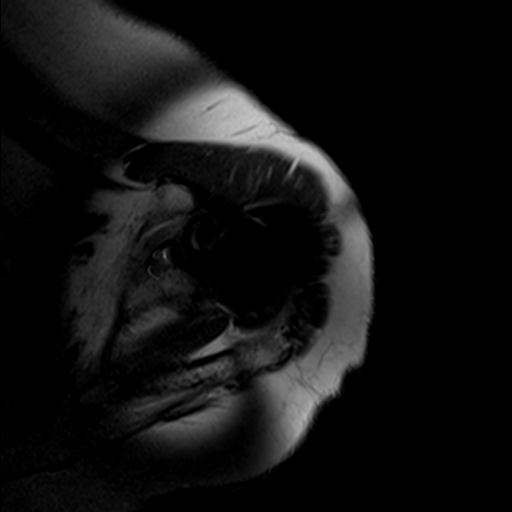
[im 16/21]
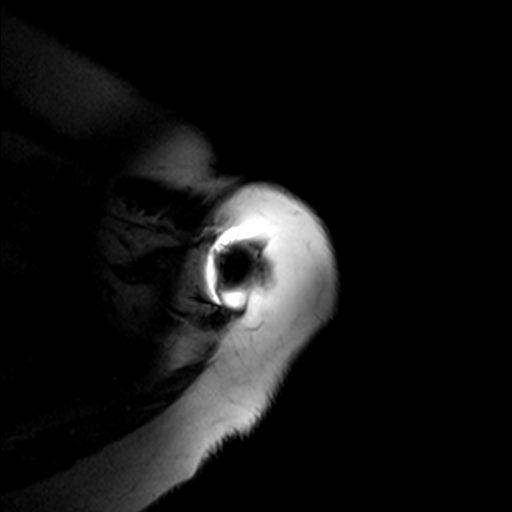
[im 21/21]
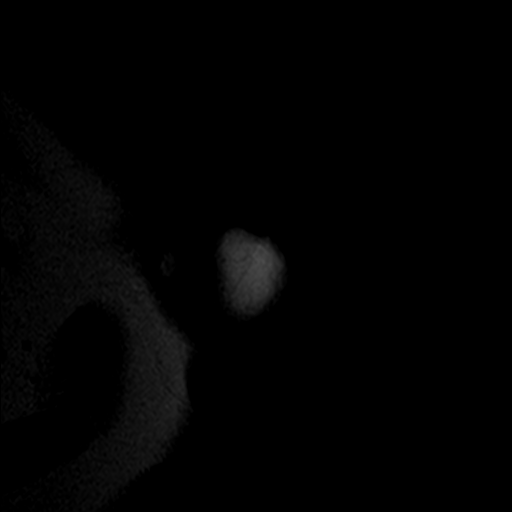

[Series 12: T1 · oblique · 4.0mm · 0.31mm/px · 5 of 18 slices shown]
[im 1/18]
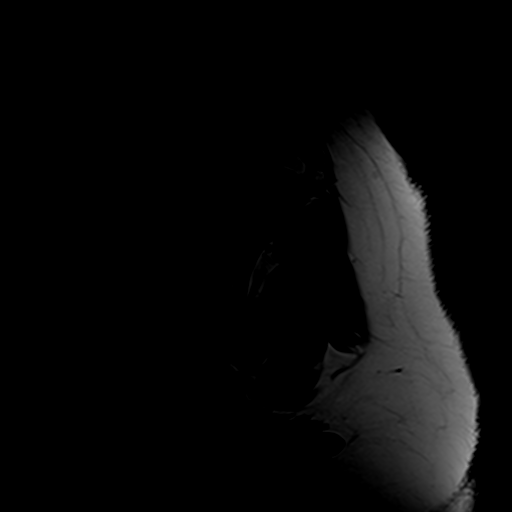
[im 5/18]
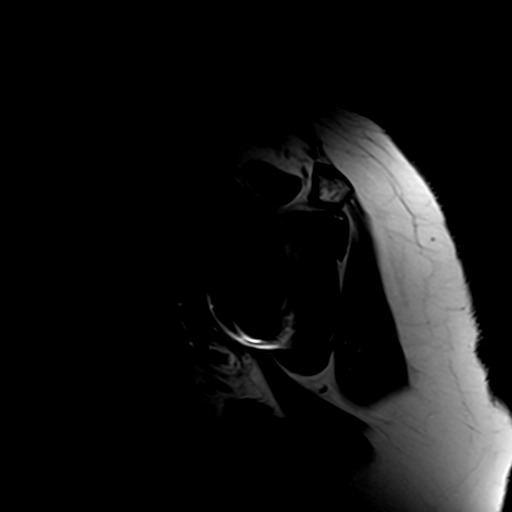
[im 9/18]
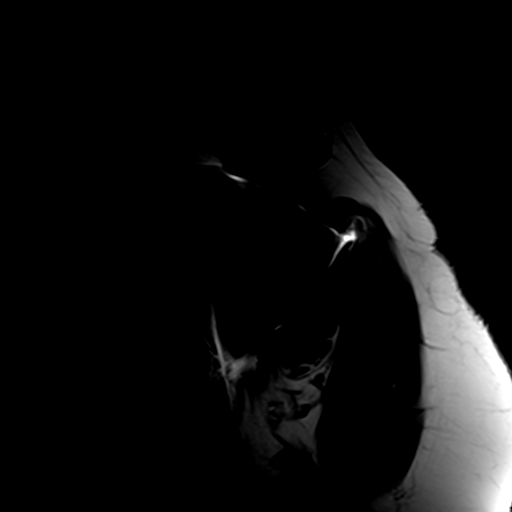
[im 13/18]
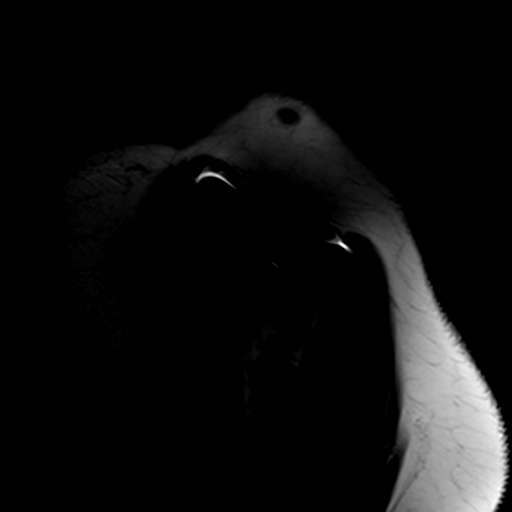
[im 18/18]
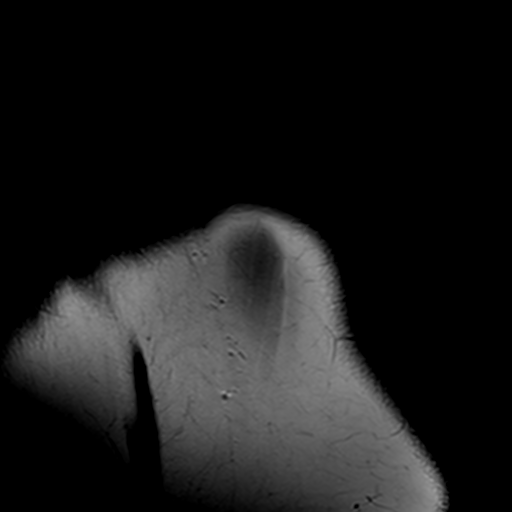

[Series 13: T2 fat-sat · oblique · 4.0mm · 0.31mm/px · 5 of 18 slices shown (2 of 2)]
[im 1/18]
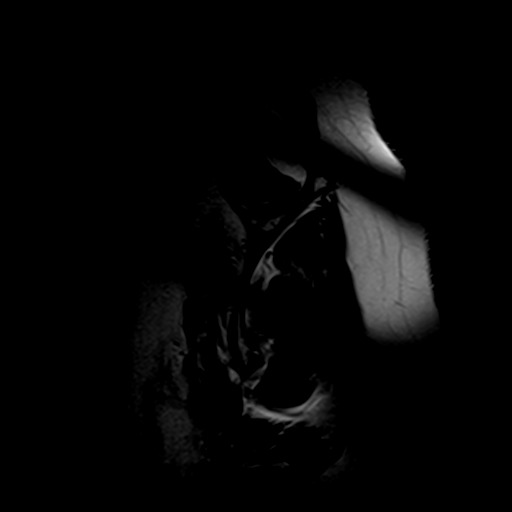
[im 5/18]
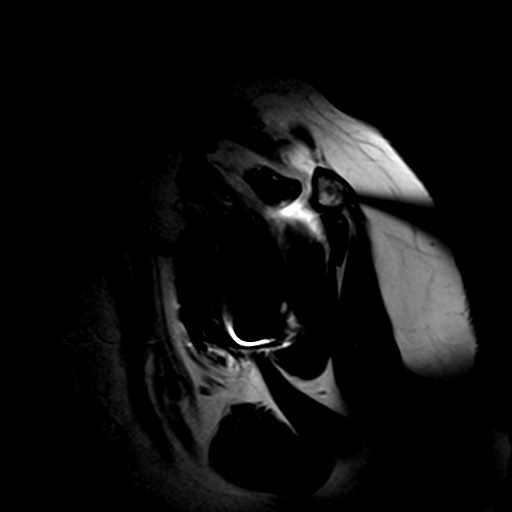
[im 9/18]
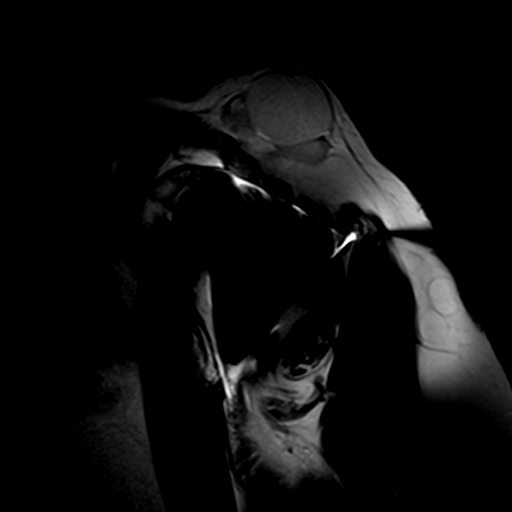
[im 13/18]
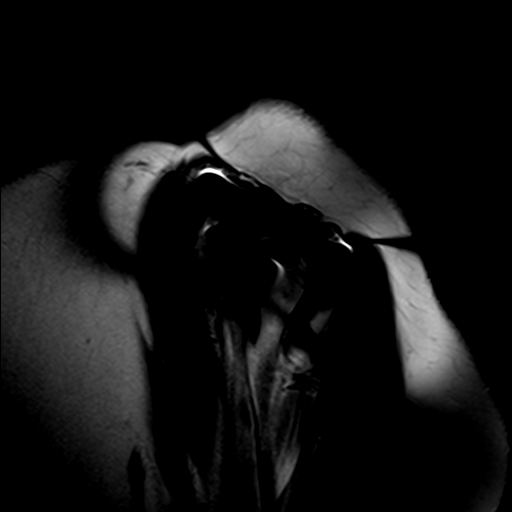
[im 18/18]
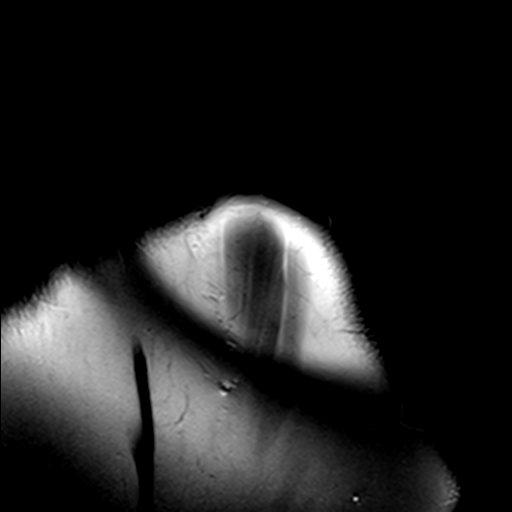

[19 of 40 positions shown; findings below may reference images not displayed]

FINDINGS: There is a multi septated cystic lesion corresponding to the
patient's palpable abnormality, located superficial to the AC joint.
It measures approximately 3.9 x 3.0 x 2.4 cm. No complicating
imaging features are demonstrated. This most likely is arising from
the AC joint which demonstrates degenerative changes.

There is significant artifact associated with the prosthetic femoral
head. No obvious complicating features are demonstrated. No abnormal
fluid collections. The shoulder musculature appears grossly intact.
IMPRESSION: 3.9 x 3.0 x 2.4 cm ganglion cyst likely arising from the AC joint.

## 2016-10-24 DIAGNOSIS — M170 Bilateral primary osteoarthritis of knee: Secondary | ICD-10-CM | POA: Diagnosis not present

## 2016-10-24 DIAGNOSIS — G894 Chronic pain syndrome: Secondary | ICD-10-CM | POA: Diagnosis not present

## 2016-10-24 DIAGNOSIS — Z96642 Presence of left artificial hip joint: Secondary | ICD-10-CM | POA: Diagnosis not present

## 2016-10-24 DIAGNOSIS — M159 Polyosteoarthritis, unspecified: Secondary | ICD-10-CM | POA: Diagnosis not present

## 2016-10-24 DIAGNOSIS — Z96612 Presence of left artificial shoulder joint: Secondary | ICD-10-CM | POA: Diagnosis not present

## 2016-10-25 DIAGNOSIS — E785 Hyperlipidemia, unspecified: Secondary | ICD-10-CM | POA: Diagnosis not present

## 2016-10-25 DIAGNOSIS — I10 Essential (primary) hypertension: Secondary | ICD-10-CM | POA: Diagnosis not present

## 2016-10-25 DIAGNOSIS — I739 Peripheral vascular disease, unspecified: Secondary | ICD-10-CM | POA: Diagnosis not present

## 2016-10-25 DIAGNOSIS — E039 Hypothyroidism, unspecified: Secondary | ICD-10-CM | POA: Diagnosis not present

## 2016-10-30 DIAGNOSIS — Z6831 Body mass index (BMI) 31.0-31.9, adult: Secondary | ICD-10-CM | POA: Diagnosis not present

## 2016-10-30 DIAGNOSIS — F339 Major depressive disorder, recurrent, unspecified: Secondary | ICD-10-CM | POA: Diagnosis not present

## 2016-10-30 DIAGNOSIS — M1711 Unilateral primary osteoarthritis, right knee: Secondary | ICD-10-CM | POA: Diagnosis not present

## 2016-10-30 DIAGNOSIS — F419 Anxiety disorder, unspecified: Secondary | ICD-10-CM | POA: Diagnosis not present

## 2016-11-06 DIAGNOSIS — M1711 Unilateral primary osteoarthritis, right knee: Secondary | ICD-10-CM | POA: Diagnosis not present

## 2016-11-13 DIAGNOSIS — M1711 Unilateral primary osteoarthritis, right knee: Secondary | ICD-10-CM | POA: Diagnosis not present

## 2016-11-20 DIAGNOSIS — H43813 Vitreous degeneration, bilateral: Secondary | ICD-10-CM | POA: Diagnosis not present

## 2016-11-21 DIAGNOSIS — F325 Major depressive disorder, single episode, in full remission: Secondary | ICD-10-CM | POA: Diagnosis not present

## 2016-11-21 DIAGNOSIS — Z6831 Body mass index (BMI) 31.0-31.9, adult: Secondary | ICD-10-CM | POA: Diagnosis not present

## 2016-12-13 DIAGNOSIS — M1711 Unilateral primary osteoarthritis, right knee: Secondary | ICD-10-CM | POA: Diagnosis not present

## 2016-12-19 DIAGNOSIS — F325 Major depressive disorder, single episode, in full remission: Secondary | ICD-10-CM | POA: Diagnosis not present

## 2016-12-19 DIAGNOSIS — F419 Anxiety disorder, unspecified: Secondary | ICD-10-CM | POA: Diagnosis not present

## 2016-12-19 DIAGNOSIS — Z6830 Body mass index (BMI) 30.0-30.9, adult: Secondary | ICD-10-CM | POA: Diagnosis not present

## 2017-01-02 DIAGNOSIS — Z79899 Other long term (current) drug therapy: Secondary | ICD-10-CM | POA: Diagnosis not present

## 2017-01-10 DIAGNOSIS — I70213 Atherosclerosis of native arteries of extremities with intermittent claudication, bilateral legs: Secondary | ICD-10-CM | POA: Diagnosis not present

## 2017-01-10 DIAGNOSIS — Z6832 Body mass index (BMI) 32.0-32.9, adult: Secondary | ICD-10-CM | POA: Diagnosis not present

## 2017-01-10 DIAGNOSIS — Z48812 Encounter for surgical aftercare following surgery on the circulatory system: Secondary | ICD-10-CM | POA: Diagnosis not present

## 2017-01-17 DIAGNOSIS — M1711 Unilateral primary osteoarthritis, right knee: Secondary | ICD-10-CM | POA: Diagnosis not present

## 2017-01-17 DIAGNOSIS — Z0189 Encounter for other specified special examinations: Secondary | ICD-10-CM | POA: Diagnosis not present

## 2017-01-23 DIAGNOSIS — M159 Polyosteoarthritis, unspecified: Secondary | ICD-10-CM | POA: Diagnosis not present

## 2017-01-23 DIAGNOSIS — Z96642 Presence of left artificial hip joint: Secondary | ICD-10-CM | POA: Diagnosis not present

## 2017-01-23 DIAGNOSIS — G894 Chronic pain syndrome: Secondary | ICD-10-CM | POA: Diagnosis not present

## 2017-01-23 DIAGNOSIS — M170 Bilateral primary osteoarthritis of knee: Secondary | ICD-10-CM | POA: Diagnosis not present

## 2017-01-23 DIAGNOSIS — Z96612 Presence of left artificial shoulder joint: Secondary | ICD-10-CM | POA: Diagnosis not present

## 2017-01-30 DIAGNOSIS — F325 Major depressive disorder, single episode, in full remission: Secondary | ICD-10-CM | POA: Diagnosis not present

## 2017-01-30 DIAGNOSIS — Z6830 Body mass index (BMI) 30.0-30.9, adult: Secondary | ICD-10-CM | POA: Diagnosis not present

## 2017-01-30 DIAGNOSIS — F4320 Adjustment disorder, unspecified: Secondary | ICD-10-CM | POA: Diagnosis not present

## 2017-02-04 DIAGNOSIS — E785 Hyperlipidemia, unspecified: Secondary | ICD-10-CM | POA: Diagnosis not present

## 2017-02-04 DIAGNOSIS — K219 Gastro-esophageal reflux disease without esophagitis: Secondary | ICD-10-CM | POA: Diagnosis not present

## 2017-02-04 DIAGNOSIS — Z1231 Encounter for screening mammogram for malignant neoplasm of breast: Secondary | ICD-10-CM | POA: Diagnosis not present

## 2017-02-04 DIAGNOSIS — E039 Hypothyroidism, unspecified: Secondary | ICD-10-CM | POA: Diagnosis not present

## 2017-02-04 DIAGNOSIS — I10 Essential (primary) hypertension: Secondary | ICD-10-CM | POA: Diagnosis not present

## 2017-02-11 DIAGNOSIS — Z1231 Encounter for screening mammogram for malignant neoplasm of breast: Secondary | ICD-10-CM | POA: Diagnosis not present

## 2017-02-11 DIAGNOSIS — I10 Essential (primary) hypertension: Secondary | ICD-10-CM | POA: Diagnosis not present

## 2017-02-18 DIAGNOSIS — N6323 Unspecified lump in the left breast, lower outer quadrant: Secondary | ICD-10-CM | POA: Diagnosis not present

## 2017-02-18 DIAGNOSIS — N6321 Unspecified lump in the left breast, upper outer quadrant: Secondary | ICD-10-CM | POA: Diagnosis not present

## 2017-02-26 DIAGNOSIS — Z6830 Body mass index (BMI) 30.0-30.9, adult: Secondary | ICD-10-CM | POA: Diagnosis not present

## 2017-02-26 DIAGNOSIS — F325 Major depressive disorder, single episode, in full remission: Secondary | ICD-10-CM | POA: Diagnosis not present

## 2017-04-01 DIAGNOSIS — Z6830 Body mass index (BMI) 30.0-30.9, adult: Secondary | ICD-10-CM | POA: Diagnosis not present

## 2017-04-01 DIAGNOSIS — F339 Major depressive disorder, recurrent, unspecified: Secondary | ICD-10-CM | POA: Diagnosis not present

## 2017-04-01 DIAGNOSIS — M1711 Unilateral primary osteoarthritis, right knee: Secondary | ICD-10-CM | POA: Diagnosis not present

## 2017-04-17 DIAGNOSIS — Z6830 Body mass index (BMI) 30.0-30.9, adult: Secondary | ICD-10-CM | POA: Diagnosis not present

## 2017-04-17 DIAGNOSIS — F325 Major depressive disorder, single episode, in full remission: Secondary | ICD-10-CM | POA: Diagnosis not present

## 2017-05-01 DIAGNOSIS — Z96642 Presence of left artificial hip joint: Secondary | ICD-10-CM | POA: Diagnosis not present

## 2017-05-01 DIAGNOSIS — Z96612 Presence of left artificial shoulder joint: Secondary | ICD-10-CM | POA: Diagnosis not present

## 2017-05-01 DIAGNOSIS — M170 Bilateral primary osteoarthritis of knee: Secondary | ICD-10-CM | POA: Diagnosis not present

## 2017-05-01 DIAGNOSIS — G894 Chronic pain syndrome: Secondary | ICD-10-CM | POA: Diagnosis not present

## 2017-05-01 DIAGNOSIS — M159 Polyosteoarthritis, unspecified: Secondary | ICD-10-CM | POA: Diagnosis not present

## 2017-05-06 DIAGNOSIS — F325 Major depressive disorder, single episode, in full remission: Secondary | ICD-10-CM | POA: Diagnosis not present

## 2017-05-06 DIAGNOSIS — Z6830 Body mass index (BMI) 30.0-30.9, adult: Secondary | ICD-10-CM | POA: Diagnosis not present

## 2017-05-27 DIAGNOSIS — Z6830 Body mass index (BMI) 30.0-30.9, adult: Secondary | ICD-10-CM | POA: Diagnosis not present

## 2017-05-27 DIAGNOSIS — F419 Anxiety disorder, unspecified: Secondary | ICD-10-CM | POA: Diagnosis not present

## 2017-05-27 DIAGNOSIS — F339 Major depressive disorder, recurrent, unspecified: Secondary | ICD-10-CM | POA: Diagnosis not present

## 2017-06-06 DIAGNOSIS — K219 Gastro-esophageal reflux disease without esophagitis: Secondary | ICD-10-CM | POA: Diagnosis not present

## 2017-06-06 DIAGNOSIS — G4719 Other hypersomnia: Secondary | ICD-10-CM | POA: Diagnosis not present

## 2017-06-06 DIAGNOSIS — E039 Hypothyroidism, unspecified: Secondary | ICD-10-CM | POA: Diagnosis not present

## 2017-06-06 DIAGNOSIS — I10 Essential (primary) hypertension: Secondary | ICD-10-CM | POA: Diagnosis not present

## 2017-06-06 DIAGNOSIS — E785 Hyperlipidemia, unspecified: Secondary | ICD-10-CM | POA: Diagnosis not present

## 2017-06-06 DIAGNOSIS — R32 Unspecified urinary incontinence: Secondary | ICD-10-CM | POA: Diagnosis not present

## 2017-06-24 DIAGNOSIS — F339 Major depressive disorder, recurrent, unspecified: Secondary | ICD-10-CM | POA: Diagnosis not present

## 2017-06-24 DIAGNOSIS — F419 Anxiety disorder, unspecified: Secondary | ICD-10-CM | POA: Diagnosis not present

## 2017-07-09 DIAGNOSIS — Z6831 Body mass index (BMI) 31.0-31.9, adult: Secondary | ICD-10-CM | POA: Diagnosis not present

## 2017-07-09 DIAGNOSIS — F339 Major depressive disorder, recurrent, unspecified: Secondary | ICD-10-CM | POA: Diagnosis not present

## 2017-07-09 DIAGNOSIS — Z79899 Other long term (current) drug therapy: Secondary | ICD-10-CM | POA: Diagnosis not present

## 2017-07-17 DIAGNOSIS — I714 Abdominal aortic aneurysm, without rupture: Secondary | ICD-10-CM | POA: Diagnosis not present

## 2017-07-17 DIAGNOSIS — I70213 Atherosclerosis of native arteries of extremities with intermittent claudication, bilateral legs: Secondary | ICD-10-CM | POA: Diagnosis not present

## 2017-07-17 DIAGNOSIS — Z48812 Encounter for surgical aftercare following surgery on the circulatory system: Secondary | ICD-10-CM | POA: Diagnosis not present

## 2017-07-17 DIAGNOSIS — Z6832 Body mass index (BMI) 32.0-32.9, adult: Secondary | ICD-10-CM | POA: Diagnosis not present

## 2017-07-18 DIAGNOSIS — M1711 Unilateral primary osteoarthritis, right knee: Secondary | ICD-10-CM | POA: Diagnosis not present

## 2017-07-19 DIAGNOSIS — M170 Bilateral primary osteoarthritis of knee: Secondary | ICD-10-CM | POA: Diagnosis not present

## 2017-07-19 DIAGNOSIS — M159 Polyosteoarthritis, unspecified: Secondary | ICD-10-CM | POA: Diagnosis not present

## 2017-07-19 DIAGNOSIS — Z79899 Other long term (current) drug therapy: Secondary | ICD-10-CM | POA: Diagnosis not present

## 2017-07-19 DIAGNOSIS — G894 Chronic pain syndrome: Secondary | ICD-10-CM | POA: Diagnosis not present

## 2017-07-19 DIAGNOSIS — Z96612 Presence of left artificial shoulder joint: Secondary | ICD-10-CM | POA: Diagnosis not present

## 2017-07-19 DIAGNOSIS — Z96642 Presence of left artificial hip joint: Secondary | ICD-10-CM | POA: Diagnosis not present

## 2017-07-22 DIAGNOSIS — Z6830 Body mass index (BMI) 30.0-30.9, adult: Secondary | ICD-10-CM | POA: Diagnosis not present

## 2017-07-22 DIAGNOSIS — F325 Major depressive disorder, single episode, in full remission: Secondary | ICD-10-CM | POA: Diagnosis not present
